# Patient Record
Sex: Female | Born: 1970 | Race: White | Hispanic: No | Marital: Single | Smoking: Never smoker
Health system: Southern US, Community
[De-identification: ages and names within clinical notes are randomized; demographics above are authoritative.]

## PROBLEM LIST (undated history)

## (undated) ENCOUNTER — Emergency Department (HOSPITAL_BASED_OUTPATIENT_CLINIC_OR_DEPARTMENT_OTHER): Admission: EM | Payer: Medicare Other | Source: Home / Self Care

## (undated) DIAGNOSIS — E669 Obesity, unspecified: Secondary | ICD-10-CM

## (undated) DIAGNOSIS — E079 Disorder of thyroid, unspecified: Secondary | ICD-10-CM

## (undated) DIAGNOSIS — K449 Diaphragmatic hernia without obstruction or gangrene: Secondary | ICD-10-CM

## (undated) DIAGNOSIS — H9190 Unspecified hearing loss, unspecified ear: Secondary | ICD-10-CM

## (undated) DIAGNOSIS — K219 Gastro-esophageal reflux disease without esophagitis: Secondary | ICD-10-CM

## (undated) DIAGNOSIS — D649 Anemia, unspecified: Secondary | ICD-10-CM

## (undated) DIAGNOSIS — K209 Esophagitis, unspecified without bleeding: Secondary | ICD-10-CM

## (undated) DIAGNOSIS — R569 Unspecified convulsions: Secondary | ICD-10-CM

## (undated) DIAGNOSIS — F32A Depression, unspecified: Secondary | ICD-10-CM

## (undated) DIAGNOSIS — C801 Malignant (primary) neoplasm, unspecified: Secondary | ICD-10-CM

## (undated) DIAGNOSIS — F445 Conversion disorder with seizures or convulsions: Secondary | ICD-10-CM

## (undated) DIAGNOSIS — T8859XA Other complications of anesthesia, initial encounter: Secondary | ICD-10-CM

## (undated) DIAGNOSIS — F319 Bipolar disorder, unspecified: Secondary | ICD-10-CM

## (undated) DIAGNOSIS — K439 Ventral hernia without obstruction or gangrene: Secondary | ICD-10-CM

## (undated) DIAGNOSIS — F419 Anxiety disorder, unspecified: Secondary | ICD-10-CM

## (undated) DIAGNOSIS — Z9889 Other specified postprocedural states: Secondary | ICD-10-CM

## (undated) DIAGNOSIS — T4145XA Adverse effect of unspecified anesthetic, initial encounter: Secondary | ICD-10-CM

## (undated) DIAGNOSIS — F329 Major depressive disorder, single episode, unspecified: Secondary | ICD-10-CM

## (undated) DIAGNOSIS — F819 Developmental disorder of scholastic skills, unspecified: Secondary | ICD-10-CM

## (undated) DIAGNOSIS — G4730 Sleep apnea, unspecified: Secondary | ICD-10-CM

## (undated) DIAGNOSIS — R112 Nausea with vomiting, unspecified: Secondary | ICD-10-CM

## (undated) DIAGNOSIS — E039 Hypothyroidism, unspecified: Secondary | ICD-10-CM

## (undated) HISTORY — DX: Diaphragmatic hernia without obstruction or gangrene: K44.9

## (undated) HISTORY — DX: Unspecified convulsions: R56.9

## (undated) HISTORY — DX: Esophagitis, unspecified: K20.9

## (undated) HISTORY — DX: Sleep apnea, unspecified: G47.30

## (undated) HISTORY — DX: Anxiety disorder, unspecified: F41.9

## (undated) HISTORY — DX: Esophagitis, unspecified without bleeding: K20.90

## (undated) HISTORY — PX: TONSILLECTOMY: SUR1361

## (undated) HISTORY — DX: Depression, unspecified: F32.A

## (undated) HISTORY — DX: Disorder of thyroid, unspecified: E07.9

## (undated) HISTORY — DX: Major depressive disorder, single episode, unspecified: F32.9

## (undated) HISTORY — PX: DILATION AND CURETTAGE OF UTERUS: SHX78

## (undated) HISTORY — DX: Conversion disorder with seizures or convulsions: F44.5

## (undated) HISTORY — DX: Obesity, unspecified: E66.9

---

## 1992-04-01 HISTORY — PX: INNER EAR SURGERY: SHX679

## 1998-11-28 ENCOUNTER — Other Ambulatory Visit: Admission: RE | Admit: 1998-11-28 | Discharge: 1998-11-28 | Payer: Self-pay | Admitting: Family Medicine

## 2001-01-26 ENCOUNTER — Emergency Department (HOSPITAL_COMMUNITY): Admission: EM | Admit: 2001-01-26 | Discharge: 2001-01-26 | Payer: Self-pay

## 2001-01-27 ENCOUNTER — Inpatient Hospital Stay (HOSPITAL_COMMUNITY): Admission: EM | Admit: 2001-01-27 | Discharge: 2001-01-29 | Payer: Self-pay | Admitting: Emergency Medicine

## 2001-02-18 ENCOUNTER — Ambulatory Visit (HOSPITAL_COMMUNITY): Admission: RE | Admit: 2001-02-18 | Discharge: 2001-02-18 | Payer: Self-pay | Admitting: Otolaryngology

## 2001-02-18 ENCOUNTER — Encounter: Payer: Self-pay | Admitting: Otolaryngology

## 2004-07-02 ENCOUNTER — Ambulatory Visit (HOSPITAL_COMMUNITY): Admission: RE | Admit: 2004-07-02 | Discharge: 2004-07-02 | Payer: Self-pay | Admitting: Pediatrics

## 2005-02-01 ENCOUNTER — Ambulatory Visit: Payer: Self-pay | Admitting: Family Medicine

## 2005-02-18 ENCOUNTER — Encounter: Admission: RE | Admit: 2005-02-18 | Discharge: 2005-05-19 | Payer: Self-pay | Admitting: Psychiatry

## 2005-02-25 ENCOUNTER — Encounter: Admission: RE | Admit: 2005-02-25 | Discharge: 2005-02-25 | Payer: Self-pay | Admitting: Otolaryngology

## 2005-08-08 ENCOUNTER — Emergency Department (HOSPITAL_COMMUNITY): Admission: EM | Admit: 2005-08-08 | Discharge: 2005-08-08 | Payer: Self-pay | Admitting: Emergency Medicine

## 2005-10-28 ENCOUNTER — Ambulatory Visit: Payer: Self-pay | Admitting: Family Medicine

## 2005-10-31 ENCOUNTER — Ambulatory Visit: Payer: Self-pay | Admitting: Family Medicine

## 2005-11-21 ENCOUNTER — Ambulatory Visit: Payer: Self-pay | Admitting: Family Medicine

## 2006-08-05 ENCOUNTER — Encounter: Admission: RE | Admit: 2006-08-05 | Discharge: 2006-08-05 | Payer: Self-pay | Admitting: Otolaryngology

## 2010-04-03 ENCOUNTER — Emergency Department: Payer: Self-pay | Admitting: Emergency Medicine

## 2010-04-08 ENCOUNTER — Emergency Department: Payer: Self-pay | Admitting: Emergency Medicine

## 2010-04-22 ENCOUNTER — Encounter: Payer: Self-pay | Admitting: Otolaryngology

## 2010-04-25 ENCOUNTER — Emergency Department: Payer: Self-pay | Admitting: Emergency Medicine

## 2011-04-03 DIAGNOSIS — F259 Schizoaffective disorder, unspecified: Secondary | ICD-10-CM | POA: Diagnosis not present

## 2011-04-08 DIAGNOSIS — Z79899 Other long term (current) drug therapy: Secondary | ICD-10-CM | POA: Diagnosis not present

## 2011-04-08 DIAGNOSIS — G40309 Generalized idiopathic epilepsy and epileptic syndromes, not intractable, without status epilepticus: Secondary | ICD-10-CM | POA: Diagnosis not present

## 2011-04-17 DIAGNOSIS — F259 Schizoaffective disorder, unspecified: Secondary | ICD-10-CM | POA: Diagnosis not present

## 2011-05-08 DIAGNOSIS — F259 Schizoaffective disorder, unspecified: Secondary | ICD-10-CM | POA: Diagnosis not present

## 2011-05-22 DIAGNOSIS — F259 Schizoaffective disorder, unspecified: Secondary | ICD-10-CM | POA: Diagnosis not present

## 2011-05-30 ENCOUNTER — Ambulatory Visit (INDEPENDENT_AMBULATORY_CARE_PROVIDER_SITE_OTHER): Payer: Medicare Other | Admitting: Family Medicine

## 2011-05-30 ENCOUNTER — Encounter: Payer: Self-pay | Admitting: Family Medicine

## 2011-05-30 DIAGNOSIS — E039 Hypothyroidism, unspecified: Secondary | ICD-10-CM | POA: Diagnosis not present

## 2011-05-30 DIAGNOSIS — F329 Major depressive disorder, single episode, unspecified: Secondary | ICD-10-CM | POA: Insufficient documentation

## 2011-05-30 DIAGNOSIS — Z136 Encounter for screening for cardiovascular disorders: Secondary | ICD-10-CM

## 2011-05-30 DIAGNOSIS — E079 Disorder of thyroid, unspecified: Secondary | ICD-10-CM | POA: Insufficient documentation

## 2011-05-30 DIAGNOSIS — F333 Major depressive disorder, recurrent, severe with psychotic symptoms: Secondary | ICD-10-CM | POA: Diagnosis not present

## 2011-05-30 DIAGNOSIS — N920 Excessive and frequent menstruation with regular cycle: Secondary | ICD-10-CM | POA: Insufficient documentation

## 2011-05-30 DIAGNOSIS — F319 Bipolar disorder, unspecified: Secondary | ICD-10-CM | POA: Insufficient documentation

## 2011-05-30 DIAGNOSIS — R569 Unspecified convulsions: Secondary | ICD-10-CM | POA: Insufficient documentation

## 2011-05-30 DIAGNOSIS — G4730 Sleep apnea, unspecified: Secondary | ICD-10-CM

## 2011-05-30 DIAGNOSIS — F32A Depression, unspecified: Secondary | ICD-10-CM | POA: Insufficient documentation

## 2011-05-30 DIAGNOSIS — F79 Unspecified intellectual disabilities: Secondary | ICD-10-CM

## 2011-05-30 LAB — CBC WITH DIFFERENTIAL/PLATELET
Basophils Relative: 0.6 % (ref 0.0–3.0)
Eosinophils Absolute: 0.2 10*3/uL (ref 0.0–0.7)
Eosinophils Relative: 4.5 % (ref 0.0–5.0)
Hemoglobin: 10.7 g/dL — ABNORMAL LOW (ref 12.0–15.0)
Lymphocytes Relative: 25.9 % (ref 12.0–46.0)
MCHC: 32.5 g/dL (ref 30.0–36.0)
Neutro Abs: 3.5 10*3/uL (ref 1.4–7.7)
RBC: 3.48 Mil/uL — ABNORMAL LOW (ref 3.87–5.11)
WBC: 5.5 10*3/uL (ref 4.5–10.5)

## 2011-05-30 LAB — BASIC METABOLIC PANEL
BUN: 11 mg/dL (ref 6–23)
Chloride: 110 mEq/L (ref 96–112)
Creatinine, Ser: 1 mg/dL (ref 0.4–1.2)
GFR: 68.08 mL/min (ref >60.00)
Glucose, Bld: 108 mg/dL — ABNORMAL HIGH (ref 70–99)

## 2011-05-30 LAB — LIPID PANEL
Cholesterol: 166 mg/dL (ref 0–200)
Total CHOL/HDL Ratio: 4

## 2011-05-30 MED ORDER — NORETHIN ACE-ETH ESTRAD-FE 1.5-30 MG-MCG PO TABS: 1.0000 | ORAL_TABLET | Freq: Every day | ORAL | Status: DC

## 2011-05-30 MED ORDER — LEVOTHYROXINE SODIUM 300 MCG PO TABS: 300.0000 ug | ORAL_TABLET | Freq: Every day | ORAL | Status: DC

## 2011-05-30 NOTE — Patient Instructions (Signed)
Nice to meet you. Let's check some blood work today. Schedule an appointment for a physical and pap smear on your way out.   I sent in prescription for birth control pill.

## 2011-05-30 NOTE — Progress Notes (Signed)
Subjective:    Patient ID: Nancy Hall, female    DOB: 1970-12-24, 41 y.o.   MRN: [id number]  HPI  41 yo female with h/o MR, seizures, depression with psychosis and pseudoseizures, hypothyroidism here to establish care.   Seizure d/o - first grandmal seziure at 25 yo. Followed by Dr. Anne Hahn.  On Topamax 100 mg twice daily, Lamictal 300 mg daily. Last grand mal seizure over a year ago. Has pseudo seizures weekly.  Bipolar disorder- followed by pscyh, on Abilify 15 mg daily.  Hypothyroidism- taking 300 mg! Of synthroid daily.  Mom reports that she has not had her thyroid rechecked in over a year. Pt is a poor historian and unable to tell me if she has had any symptoms of hypo or hyperthyroidism.  Menorrhagia- past 6 months, periods are heavy and more irregular.  According to her mom, using several pads per day for at least 5 days and occuring as often as every 3 weeks. Denies being dizzy when standing from a seated position. Has never had a pap smear.  Patient Active Problem List  Diagnoses  . Seizures  . Hypothyroidism  . Depression, major, recurrent, severe with psychosis  . MR (mental retardation)  . Bipolar disorder  . Sleep apnea  . Menorrhagia   Past Medical History  Diagnosis Date  . Depression   . Thyroid disease   . Seizures     according to echart- seizures vs pseudoseizures   No past surgical history on file. History  Substance Use Topics  . Smoking status: Never Smoker   . Smokeless tobacco: Not on file  . Alcohol Use: Not on file   No family history on file. Allergies  Allergen Reactions  . Penicillins Rash   No current outpatient prescriptions on file prior to visit.   The PMH, PSH, Social History, Family History, Medications, and allergies have been reviewed in [facility], and have been updated if relevant.   Review of Systems See HPI Patient reports no  vision/ hearing changes,anorexia, weight change, fever ,adenopathy, persistant / recurrent  hoarseness, swallowing issues, chest pain, edema,persistant / recurrent cough, hemoptysis, dyspnea(rest, exertional, paroxysmal nocturnal), gastrointestinal  bleeding (melena, rectal bleeding), abdominal pain, excessive heart burn, GU symptoms(dysuria, hematuria, pyuria, voiding/incontinence  Issues) syncope, focal weakness, severe memory loss, concerning skin lesions, depression, anxiety, abnormal bruising/bleeding, major joint swelling, breast masses or abnormal vaginal bleeding.       Objective:   Physical Exam BP 110/70  Pulse 60  Temp(Src) 97.8 F (36.6 C) (Oral)  Ht 5\' 5"  (1.651 m)  Wt 263 lb (119.296 kg)  BMI 43.77 kg/m2  LMP 05/25/2011  General:  Well-developed,well-nourished,in no acute distress; alert,appropriate and cooperative throughout examination Head:  normocephalic and atraumatic.   Eyes:  vision grossly intact, pupils equal, pupils round, and pupils reactive to light.   Lungs:  Normal respiratory effort, chest expands symmetrically. Lungs are clear to auscultation, no crackles or wheezes. Heart:  Normal rate and regular rhythm. S1 and S2 normal without gallop, murmur, click, rub or other extra sounds. Abdomen:  Bowel sounds positive,abdomen soft and non-tender without masses, organomegaly or hernias noted. Msk:  No deformity or scoliosis noted of thoracic or lumbar spine.   Extremities:  No clubbing, cyanosis, edema, or deformity noted with normal full range of motion of all joints.   Psych:  Good eye contact, conversant but shy Assessment & Plan:   1. Hypothyroidism  Taking a very high dose. Will recheck TSH and FT4 today. T4, Free, TSH  2. Depression, major, recurrent, severe with psychosis  Per mom, currently stable.   3. Seizures  Stable, followed by Dr. Anne Hahn   4. Bipolar disorder  Per mom, stable.   5. Menorrhagia  New.  Discussed tx options and pt agreed to try OCPs.  Will start Loestrin and follow up with me for CPX/Pap. Basic Metabolic Panel,  norethindrone-ethinyl estradiol-iron (MICROGESTIN FE,GILDESS FE,LOESTRIN FE) 1.5-30 MG-MCG tablet, CBC with Differential

## 2011-06-11 DIAGNOSIS — F259 Schizoaffective disorder, unspecified: Secondary | ICD-10-CM | POA: Diagnosis not present

## 2011-06-24 ENCOUNTER — Other Ambulatory Visit: Payer: Self-pay | Admitting: Family Medicine

## 2011-06-24 ENCOUNTER — Other Ambulatory Visit (HOSPITAL_COMMUNITY)
Admission: RE | Admit: 2011-06-24 | Discharge: 2011-06-24 | Disposition: A | Discharge status: Home / Self Care | Payer: Medicare Other | Source: Ambulatory Visit | Attending: Family Medicine | Admitting: Family Medicine

## 2011-06-24 ENCOUNTER — Ambulatory Visit (INDEPENDENT_AMBULATORY_CARE_PROVIDER_SITE_OTHER): Payer: Medicare Other | Admitting: Family Medicine

## 2011-06-24 ENCOUNTER — Encounter: Payer: Self-pay | Admitting: Family Medicine

## 2011-06-24 VITALS — BP 110/78 | HR 76 | Temp 97.8°F | Wt 263.0 lb

## 2011-06-24 DIAGNOSIS — Z01419 Encounter for gynecological examination (general) (routine) without abnormal findings: Secondary | ICD-10-CM | POA: Diagnosis not present

## 2011-06-24 DIAGNOSIS — F333 Major depressive disorder, recurrent, severe with psychotic symptoms: Secondary | ICD-10-CM

## 2011-06-24 DIAGNOSIS — N920 Excessive and frequent menstruation with regular cycle: Secondary | ICD-10-CM | POA: Diagnosis not present

## 2011-06-24 DIAGNOSIS — Z124 Encounter for screening for malignant neoplasm of cervix: Secondary | ICD-10-CM | POA: Insufficient documentation

## 2011-06-24 DIAGNOSIS — Z0000 Encounter for general adult medical examination without abnormal findings: Secondary | ICD-10-CM | POA: Diagnosis not present

## 2011-06-24 DIAGNOSIS — Z1159 Encounter for screening for other viral diseases: Secondary | ICD-10-CM | POA: Diagnosis not present

## 2011-06-24 DIAGNOSIS — E039 Hypothyroidism, unspecified: Secondary | ICD-10-CM | POA: Diagnosis not present

## 2011-06-24 DIAGNOSIS — Z139 Encounter for screening, unspecified: Secondary | ICD-10-CM

## 2011-06-24 NOTE — Patient Instructions (Signed)
Good to see you. We will call you or send a letter with results from your pap smear. Please let me know how your vaginal bleeding is doing over next several months.

## 2011-06-24 NOTE — Progress Notes (Signed)
Subjective:    Patient ID: Nancy Hall, female    DOB: 06-04-1970, 41 y.o.   MRN: [id number]  HPI  41 yo female with h/o MR, seizures, depression with psychosis and pseudoseizures, hypothyroidism here for CPX/Annual Medicare Wellness visit.  I have personally reviewed the Medicare Annual Wellness questionnaire and have noted 1. The patient's medical and social history 2. Their use of alcohol, tobacco or illicit drugs 3. Their current medications and supplements 4. The patient's functional ability including ADL's, fall risks, home safety risks and hearing or visual             impairment. 5. Diet and physical activities 6. Evidence for depression or mood disorders   Seizure d/o - first grandmal seziure at 66 yo. Followed by Dr. Anne Hahn.  On Topamax 100 mg twice daily, Lamictal 300 mg daily. Last grand mal seizure over a year ago. Has pseudo seizures weekly.  Bipolar disorder- followed by pscyh, on Abilify 15 mg daily.  Hypothyroidism- taking 300 mg Of synthroid daily.   Pt is a poor historian and unable to tell me if she has had any symptoms of hypo or hyperthyroidism. Lab Results  Component Value Date   TSH 0.04* 05/30/2011  .but FT4 within normal limits- 0.97.  Menorrhagia- past 6 months, periods are heavy and more irregular.  According to her mom, using several pads per day for at least 5 days and occuring as often as every 3 weeks. Denies being dizzy when standing from a seated position. Has never had a pap smear. Started loestrin last month.  Patient Active Problem List  Diagnoses  . Seizures  . Hypothyroidism  . Depression, major, recurrent, severe with psychosis  . MR (mental retardation)  . Bipolar disorder  . Sleep apnea  . Menorrhagia  . Routine general medical examination at a health care facility   Past Medical History  Diagnosis Date  . Depression   . Thyroid disease   . Seizures     according to echart- seizures vs pseudoseizures   No past surgical  history on file. History  Substance Use Topics  . Smoking status: Never Smoker   . Smokeless tobacco: Not on file  . Alcohol Use: Not on file   No family history on file. Allergies  Allergen Reactions  . Penicillins Rash   Current Outpatient Prescriptions on File Prior to Visit  Medication Sig Dispense Refill  . ARIPiprazole (ABILIFY) 15 MG tablet Take 15 mg by mouth daily.      . Fluticasone-Salmeterol (ADVAIR) 100-50 MCG/DOSE AEPB Inhale 1 puff into the lungs every 12 (twelve) hours.      Marland Kitchen lamoTRIgine (LAMICTAL) 150 MG tablet Take 150 mg by mouth 2 (two) times daily.      Marland Kitchen levothyroxine (SYNTHROID) 300 MCG tablet Take 1 tablet (300 mcg total) by mouth daily.      Marland Kitchen LIOTHYRONINE SODIUM PO Take by mouth.      . norethindrone-ethinyl estradiol-iron (MICROGESTIN FE,GILDESS FE,LOESTRIN FE) 1.5-30 MG-MCG tablet Take 1 tablet by mouth daily.  1 Package  11  . sertraline (ZOLOFT) 100 MG tablet Take one by mouth daily      . topiramate (TOPAMAX) 100 MG tablet Take 100 mg by mouth 2 (two) times daily.       The PMH, PSH, Social History, Family History, Medications, and allergies have been reviewed in [facility], and have been updated if relevant.   Review of Systems See HPI Patient reports no  vision/ hearing changes,anorexia, weight change, fever ,  adenopathy, persistant / recurrent hoarseness, swallowing issues, chest pain, edema,persistant / recurrent cough, hemoptysis, dyspnea(rest, exertional, paroxysmal nocturnal), gastrointestinal  bleeding (melena, rectal bleeding), abdominal pain, excessive heart burn, GU symptoms(dysuria, hematuria, pyuria, voiding/incontinence  Issues) syncope, focal weakness, severe memory loss, concerning skin lesions, depression, anxiety, abnormal bruising/bleeding, major joint swelling, breast masses or abnormal vaginal bleeding.       Objective:   Physical Exam LMP 05/25/2011 BP 110/78  Pulse 76  Temp(Src) 97.8 F (36.6 C) (Oral)  Wt 263 lb (119.296 kg)  LMP  06/21/2011  General:  Well-developed,well-nourished,in no acute distress; alert,appropriate and cooperative throughout examination Head:  normocephalic and atraumatic.   Eyes:  vision grossly intact, pupils equal, pupils round, and pupils reactive to light.   Lungs:  Normal respiratory effort, chest expands symmetrically. Lungs are clear to auscultation, no crackles or wheezes. Heart:  Normal rate and regular rhythm. S1 and S2 normal without gallop, murmur, click, rub or other extra sounds. Abdomen:  Bowel sounds positive,abdomen soft and non-tender without masses, organomegaly or hernias noted. Msk:  No deformity or scoliosis noted of thoracic or lumbar spine.   Extremities:  No clubbing, cyanosis, edema, or deformity noted with normal full range of motion of all joints.   Psych:  Good eye contact, conversant but shy Assessment & Plan:   1. Depression, major, recurrent, severe with psychosis  Stable, followed by psychiatry.   2. Routine general medical examination at a health care facility  The patients weight, height, BMI and visual acuity have been recorded in the chart I have made referrals, counseling and provided education to the patient based review of the above and I have provided the pt with a written personalized care plan for preventive services.  Cytology - PAP  3. Hypothyroidism  Stable on current meds.   4. Menorrhagia   Started OCPs a few weeks ago.

## 2011-06-26 DIAGNOSIS — F259 Schizoaffective disorder, unspecified: Secondary | ICD-10-CM | POA: Diagnosis not present

## 2011-07-02 ENCOUNTER — Encounter: Payer: Self-pay | Admitting: *Deleted

## 2011-07-02 LAB — HM PAP SMEAR: HM Pap smear: NORMAL

## 2011-07-10 DIAGNOSIS — F259 Schizoaffective disorder, unspecified: Secondary | ICD-10-CM | POA: Diagnosis not present

## 2011-07-30 DIAGNOSIS — F259 Schizoaffective disorder, unspecified: Secondary | ICD-10-CM | POA: Diagnosis not present

## 2011-08-14 DIAGNOSIS — F259 Schizoaffective disorder, unspecified: Secondary | ICD-10-CM | POA: Diagnosis not present

## 2011-08-19 ENCOUNTER — Other Ambulatory Visit: Payer: Self-pay | Admitting: Family Medicine

## 2011-08-28 DIAGNOSIS — F259 Schizoaffective disorder, unspecified: Secondary | ICD-10-CM | POA: Diagnosis not present

## 2011-09-18 DIAGNOSIS — F259 Schizoaffective disorder, unspecified: Secondary | ICD-10-CM | POA: Diagnosis not present

## 2011-10-10 DIAGNOSIS — G40309 Generalized idiopathic epilepsy and epileptic syndromes, not intractable, without status epilepticus: Secondary | ICD-10-CM | POA: Diagnosis not present

## 2011-10-16 DIAGNOSIS — G40309 Generalized idiopathic epilepsy and epileptic syndromes, not intractable, without status epilepticus: Secondary | ICD-10-CM | POA: Diagnosis not present

## 2011-10-22 DIAGNOSIS — F259 Schizoaffective disorder, unspecified: Secondary | ICD-10-CM | POA: Diagnosis not present

## 2011-11-06 DIAGNOSIS — F259 Schizoaffective disorder, unspecified: Secondary | ICD-10-CM | POA: Diagnosis not present

## 2011-11-15 ENCOUNTER — Encounter: Payer: Self-pay | Admitting: Family Medicine

## 2011-11-15 ENCOUNTER — Ambulatory Visit (INDEPENDENT_AMBULATORY_CARE_PROVIDER_SITE_OTHER): Payer: Medicare Other | Admitting: Family Medicine

## 2011-11-15 VITALS — BP 108/80 | HR 62 | Temp 97.7°F | Resp 18 | Wt 256.0 lb

## 2011-11-15 DIAGNOSIS — R109 Unspecified abdominal pain: Secondary | ICD-10-CM

## 2011-11-15 NOTE — Progress Notes (Signed)
Subjective:    Patient ID: Nancy Hall, female    DOB: 12-28-1970, 41 y.o.   MRN: [id number]  HPI  41 yo female with h/o MR, seizures, depression with psychosis and pseudoseizures, hypothyroidism here for several months of abdominal pain.  Pain is almost always postprandial.  She reports it is usually in epigastric area but also can be in RUQ.  Getting progressively worse and worse with greasy food.  No n/v/d. No blood in stool.  No fever.    Patient Active Problem List  Diagnosis  . Seizures  . Hypothyroidism  . Depression, major, recurrent, severe with psychosis  . MR (mental retardation)  . Bipolar disorder  . Sleep apnea  . Menorrhagia  . Routine general medical examination at a health care facility  . Gynecological examination  . Abdominal pain   Past Medical History  Diagnosis Date  . Depression   . Thyroid disease   . Seizures     according to echart- seizures vs pseudoseizures   No past surgical history on file. History  Substance Use Topics  . Smoking status: Never Smoker   . Smokeless tobacco: Not on file  . Alcohol Use: Not on file   No family history on file. Allergies  Allergen Reactions  . Penicillins Rash   Current Outpatient Prescriptions on File Prior to Visit  Medication Sig Dispense Refill  . ARIPiprazole (ABILIFY) 15 MG tablet Take 15 mg by mouth daily.      . Fluticasone-Salmeterol (ADVAIR) 100-50 MCG/DOSE AEPB Inhale 1 puff into the lungs every 12 (twelve) hours.      Marland Kitchen lamoTRIgine (LAMICTAL) 150 MG tablet Take 150 mg by mouth 2 (two) times daily.      Marland Kitchen levothyroxine (SYNTHROID) 300 MCG tablet Take 1 tablet (300 mcg total) by mouth daily.      Marland Kitchen levothyroxine (SYNTHROID, LEVOTHROID) 125 MCG tablet TAKE 2 TABLETS BY MOUTH EVERY DAY  60 tablet  11  . levothyroxine (SYNTHROID, LEVOTHROID) 50 MCG tablet TAKE 1 TABLET BY MOUTH EVERY DAY  30 tablet  11  . liothyronine (CYTOMEL) 25 MCG tablet TAKE 1 TABLET BY MOUTH EVERY DAY  30 tablet   11  . norethindrone-ethinyl estradiol-iron (MICROGESTIN FE,GILDESS FE,LOESTRIN FE) 1.5-30 MG-MCG tablet Take 1 tablet by mouth daily.  1 Package  11  . sertraline (ZOLOFT) 100 MG tablet Take one by mouth daily      . topiramate (TOPAMAX) 100 MG tablet Take one by mouth in the morning and one and a half at night      . DISCONTD: LIOTHYRONINE SODIUM PO Take by mouth.       The PMH, PSH, Social History, Family History, Medications, and allergies have been reviewed in [facility], and have been updated if relevant.   Review of Systems See HPI  Denies any feeling of acid in throat or difficulty swallowing     Objective:   Physical Exam BP 108/80  Pulse 62  Temp 97.7 F (36.5 C)  Resp 18  Wt 256 lb (116.121 kg)  General:  Well-developed,well-nourished,in no acute distress; alert,appropriate and cooperative throughout examination Head:  normocephalic and atraumatic.   Eyes:  vision grossly intact, pupils equal, pupils round, and pupils reactive to light.   Lungs:  Normal respiratory effort, chest expands symmetrically. Lungs are clear to auscultation, no crackles or wheezes. Heart:  Normal rate and regular rhythm. S1 and S2 normal without gallop, murmur, click, rub or other extra sounds. Abdomen:  Bowel sounds positive,abdomen soft, mildly TTP  RUQ Msk:  No deformity or scoliosis noted of thoracic or lumbar spine.   Extremities:  No clubbing, cyanosis, edema, or deformity noted with normal full range of motion of all joints.   Psych:  Good eye contact, conversant but shy (baseline) Assessment & Plan:   1. Abdominal pain  US Abdomen Complete   New- suspicious for biliary colic/stones. Will order abdominal U/S.  If neg, consider GI referral given duration and progression of symptoms. The patient and her mother indicate understanding of these issues and agrees with the plan.'

## 2011-11-15 NOTE — Patient Instructions (Addendum)
Nice to see you. Have a great weekend. Please stop by to see Nancy Hall on your way out to set up your ultrasound. I will call you with your results as soon as I get them.

## 2011-11-19 ENCOUNTER — Encounter: Payer: Self-pay | Admitting: Family Medicine

## 2011-11-19 ENCOUNTER — Ambulatory Visit: Payer: Self-pay | Admitting: Family Medicine

## 2011-11-19 DIAGNOSIS — R109 Unspecified abdominal pain: Secondary | ICD-10-CM | POA: Diagnosis not present

## 2011-11-27 DIAGNOSIS — F259 Schizoaffective disorder, unspecified: Secondary | ICD-10-CM | POA: Diagnosis not present

## 2011-12-23 DIAGNOSIS — H04129 Dry eye syndrome of unspecified lacrimal gland: Secondary | ICD-10-CM | POA: Diagnosis not present

## 2011-12-25 DIAGNOSIS — F259 Schizoaffective disorder, unspecified: Secondary | ICD-10-CM | POA: Diagnosis not present

## 2011-12-26 DIAGNOSIS — H6980 Other specified disorders of Eustachian tube, unspecified ear: Secondary | ICD-10-CM | POA: Diagnosis not present

## 2011-12-26 DIAGNOSIS — H903 Sensorineural hearing loss, bilateral: Secondary | ICD-10-CM | POA: Diagnosis not present

## 2012-01-21 DIAGNOSIS — F259 Schizoaffective disorder, unspecified: Secondary | ICD-10-CM | POA: Diagnosis not present

## 2012-02-05 DIAGNOSIS — F259 Schizoaffective disorder, unspecified: Secondary | ICD-10-CM | POA: Diagnosis not present

## 2012-03-03 DIAGNOSIS — F259 Schizoaffective disorder, unspecified: Secondary | ICD-10-CM | POA: Diagnosis not present

## 2012-03-06 DIAGNOSIS — G40309 Generalized idiopathic epilepsy and epileptic syndromes, not intractable, without status epilepticus: Secondary | ICD-10-CM | POA: Diagnosis not present

## 2012-03-18 DIAGNOSIS — F259 Schizoaffective disorder, unspecified: Secondary | ICD-10-CM | POA: Diagnosis not present

## 2012-04-01 DIAGNOSIS — R569 Unspecified convulsions: Secondary | ICD-10-CM

## 2012-04-01 HISTORY — DX: Unspecified convulsions: R56.9

## 2012-04-08 ENCOUNTER — Ambulatory Visit (INDEPENDENT_AMBULATORY_CARE_PROVIDER_SITE_OTHER): Payer: Medicare Other | Admitting: *Deleted

## 2012-04-08 DIAGNOSIS — F259 Schizoaffective disorder, unspecified: Secondary | ICD-10-CM | POA: Diagnosis not present

## 2012-04-08 DIAGNOSIS — Z23 Encounter for immunization: Secondary | ICD-10-CM | POA: Diagnosis not present

## 2012-05-04 DIAGNOSIS — F259 Schizoaffective disorder, unspecified: Secondary | ICD-10-CM | POA: Diagnosis not present

## 2012-05-06 ENCOUNTER — Encounter: Payer: Self-pay | Admitting: Family Medicine

## 2012-05-06 ENCOUNTER — Ambulatory Visit (INDEPENDENT_AMBULATORY_CARE_PROVIDER_SITE_OTHER): Payer: Medicare Other | Admitting: Family Medicine

## 2012-05-06 VITALS — BP 100/62 | HR 90 | Temp 97.6°F | Wt 256.0 lb

## 2012-05-06 DIAGNOSIS — T887XXA Unspecified adverse effect of drug or medicament, initial encounter: Secondary | ICD-10-CM

## 2012-05-06 DIAGNOSIS — E039 Hypothyroidism, unspecified: Secondary | ICD-10-CM

## 2012-05-06 DIAGNOSIS — Z136 Encounter for screening for cardiovascular disorders: Secondary | ICD-10-CM

## 2012-05-06 DIAGNOSIS — T50905A Adverse effect of unspecified drugs, medicaments and biological substances, initial encounter: Secondary | ICD-10-CM

## 2012-05-06 LAB — LIPID PANEL
Cholesterol: 166 mg/dL (ref 0–200)
Total CHOL/HDL Ratio: 4
Triglycerides: 239 mg/dL — ABNORMAL HIGH (ref 0.0–149.0)
VLDL: 47.8 mg/dL — ABNORMAL HIGH (ref 0.0–40.0)

## 2012-05-06 LAB — T4, FREE: Free T4: 1 ng/dL (ref 0.60–1.60)

## 2012-05-06 LAB — CBC WITH DIFFERENTIAL/PLATELET
Eosinophils Relative: 3.7 % (ref 0.0–5.0)
HCT: 38 % (ref 36.0–46.0)
Lymphs Abs: 1.5 10*3/uL (ref 0.7–4.0)
Monocytes Relative: 8.3 % (ref 3.0–12.0)
Platelets: 162 10*3/uL (ref 150.0–400.0)
RBC: 4.06 Mil/uL (ref 3.87–5.11)
WBC: 5.2 10*3/uL (ref 4.5–10.5)

## 2012-05-06 LAB — COMPREHENSIVE METABOLIC PANEL
ALT: 16 U/L (ref 0–35)
CO2: 24 mEq/L (ref 19–32)
Calcium: 8.3 mg/dL — ABNORMAL LOW (ref 8.4–10.5)
Chloride: 110 mEq/L (ref 96–112)
Creatinine, Ser: 0.9 mg/dL (ref 0.4–1.2)
GFR: 72.01 mL/min (ref >60.00)
Glucose, Bld: 86 mg/dL (ref 70–99)
Total Bilirubin: 0.4 mg/dL (ref 0.3–1.2)
Total Protein: 6.6 g/dL (ref 6.0–8.3)

## 2012-05-06 NOTE — Patient Instructions (Addendum)
Good to see you. We will call you and we will fax results to [employer].

## 2012-05-06 NOTE — Progress Notes (Signed)
Subjective:    Patient ID: Nancy Hall, female    DOB: 1970/07/31, 42 y.o.   MRN: [id number]  HPI  42 yo female with h/o MR, seizures, depression with psychosis and pseudoseizures, hypothyroidism here for :  Fatigue- h/o of hypothyroidism on high dose synthroid replacement.  She is on abilify and followed by Nancy Hall.  Her depression has deteriorated and Nancy Hall wanted Nancy Hall to come in today to make sure her thyroid isn't under or over corrected to account for her worsening symptoms.  Does have occasional "hot flashes," otherwise denies any symptoms of hypo or hyperthyroidism.   Seizure d/o - first grandmal seziure at 79 yo. Followed by Nancy Hall.  On Topamax 100 mg twice daily, Lamictal 300 mg daily. Last grand mal seizure over a year ago. Has pseudo seizures weekly.  Bipolar disorder- followed by pscyh, on Abilify 15 mg daily.    Patient Active Problem List  Diagnosis  . Seizures  . Hypothyroidism  . Depression, major, recurrent, severe with psychosis  . MR (mental retardation)  . Bipolar disorder  . Sleep apnea  . Menorrhagia  . Gynecological examination  . Abdominal pain   Past Medical History  Diagnosis Date  . Depression   . Thyroid disease   . Seizures     according to echart- seizures vs pseudoseizures   No past surgical history on file. History  Substance Use Topics  . Smoking status: Never Smoker   . Smokeless tobacco: Not on file  . Alcohol Use: Not on file   No family history on file. Allergies  Allergen Reactions  . Penicillins Rash   Current Outpatient Prescriptions on File Prior to Visit  Medication Sig Dispense Refill  . ARIPiprazole (ABILIFY) 15 MG tablet Take 15 mg by mouth daily.      . Fluticasone-Salmeterol (ADVAIR) 100-50 MCG/DOSE AEPB Inhale 1 puff into the lungs every 12 (twelve) hours.      Marland Kitchen lamoTRIgine (LAMICTAL) 150 MG tablet Take 150 mg by mouth 2 (two) times daily.      Marland Kitchen levothyroxine (SYNTHROID) 300 MCG tablet Take 1  tablet (300 mcg total) by mouth daily.      Marland Kitchen levothyroxine (SYNTHROID, LEVOTHROID) 125 MCG tablet TAKE 2 TABLETS BY MOUTH EVERY DAY  60 tablet  11  . levothyroxine (SYNTHROID, LEVOTHROID) 50 MCG tablet TAKE 1 TABLET BY MOUTH EVERY DAY  30 tablet  11  . liothyronine (CYTOMEL) 25 MCG tablet TAKE 1 TABLET BY MOUTH EVERY DAY  30 tablet  11  . norethindrone-ethinyl estradiol-iron (MICROGESTIN FE,GILDESS FE,LOESTRIN FE) 1.5-30 MG-MCG tablet Take 1 tablet by mouth daily.  1 Package  11  . sertraline (ZOLOFT) 100 MG tablet Take one by mouth daily      . topiramate (TOPAMAX) 100 MG tablet Take one by mouth in the morning and one and a half at night       The PMH, PSH, Social History, Family History, Medications, and allergies have been reviewed in [facility], and have been updated if relevant.   Review of Systems See HPI      Objective:   Physical Exam BP 100/62  Pulse 90  Temp 97.6 F (36.4 C)  Wt 256 lb (116.121 kg)  General:  Well-developed,well-nourished,in no acute distress; alert,appropriate and cooperative throughout examination Head:  normocephalic and atraumatic.   Eyes:  vision grossly intact, pupils equal, pupils round, and pupils reactive to light.   Lungs:  Normal respiratory effort, chest expands symmetrically. Lungs are clear to auscultation, no  crackles or wheezes. Heart:  Normal rate and regular rhythm. S1 and S2 normal without gallop, murmur, click, rub or other extra sounds. Abdomen:  Bowel sounds positive,abdomen soft and non-tender without masses, organomegaly or hernias noted. Msk:  No deformity or scoliosis noted of thoracic or lumbar spine.   Extremities:  No clubbing, cyanosis, edema, or deformity noted with normal full range of motion of all joints.   Psych:  Good eye contact, conversant but shy Assessment & Plan:   1. Hypothyroidism  Continue current dose of synthroid. Recheck labs today. TSH, T4, Free  2. Medication adverse effect  Check labs since she is on  antipsychotic. The patient indicates understanding of these issues and agrees with the plan.  Comprehensive metabolic panel, CBC with Differential  3. Screening for ischemic heart disease  Lipid panel

## 2012-05-20 DIAGNOSIS — F259 Schizoaffective disorder, unspecified: Secondary | ICD-10-CM | POA: Diagnosis not present

## 2012-05-26 ENCOUNTER — Other Ambulatory Visit: Payer: Self-pay | Admitting: Family Medicine

## 2012-05-31 ENCOUNTER — Other Ambulatory Visit: Payer: Self-pay | Admitting: Family Medicine

## 2012-06-29 ENCOUNTER — Other Ambulatory Visit: Payer: Self-pay | Admitting: Family Medicine

## 2012-07-01 DIAGNOSIS — F259 Schizoaffective disorder, unspecified: Secondary | ICD-10-CM | POA: Diagnosis not present

## 2012-07-23 DIAGNOSIS — H903 Sensorineural hearing loss, bilateral: Secondary | ICD-10-CM | POA: Diagnosis not present

## 2012-07-23 DIAGNOSIS — H60399 Other infective otitis externa, unspecified ear: Secondary | ICD-10-CM | POA: Diagnosis not present

## 2012-07-23 DIAGNOSIS — H6980 Other specified disorders of Eustachian tube, unspecified ear: Secondary | ICD-10-CM | POA: Diagnosis not present

## 2012-07-29 DIAGNOSIS — F259 Schizoaffective disorder, unspecified: Secondary | ICD-10-CM | POA: Diagnosis not present

## 2012-08-05 ENCOUNTER — Other Ambulatory Visit: Payer: Self-pay | Admitting: Family Medicine

## 2012-08-11 ENCOUNTER — Other Ambulatory Visit: Payer: Self-pay | Admitting: Family Medicine

## 2012-09-01 DIAGNOSIS — H60399 Other infective otitis externa, unspecified ear: Secondary | ICD-10-CM | POA: Diagnosis not present

## 2012-09-01 DIAGNOSIS — H903 Sensorineural hearing loss, bilateral: Secondary | ICD-10-CM | POA: Diagnosis not present

## 2012-09-01 DIAGNOSIS — H6980 Other specified disorders of Eustachian tube, unspecified ear: Secondary | ICD-10-CM | POA: Diagnosis not present

## 2012-09-03 ENCOUNTER — Encounter: Payer: Self-pay | Admitting: Nurse Practitioner

## 2012-09-03 ENCOUNTER — Ambulatory Visit (INDEPENDENT_AMBULATORY_CARE_PROVIDER_SITE_OTHER): Payer: Medicare Other | Admitting: Nurse Practitioner

## 2012-09-03 VITALS — BP 109/69 | HR 82 | Ht 66.0 in | Wt 272.0 lb

## 2012-09-03 DIAGNOSIS — G40309 Generalized idiopathic epilepsy and epileptic syndromes, not intractable, without status epilepticus: Secondary | ICD-10-CM

## 2012-09-03 MED ORDER — TOPIRAMATE 100 MG PO TABS: ORAL_TABLET | ORAL | Status: DC

## 2012-09-03 NOTE — Progress Notes (Signed)
I have read the note, and I agree with the clinical assessment and plan.  

## 2012-09-03 NOTE — Patient Instructions (Addendum)
Continue Topamax at current dose.  Will refill Seizure control is better F/U in 6 months

## 2012-09-03 NOTE — Progress Notes (Signed)
HPI: Patient returns for followup after last visit 03/06/2012. She has a history of seizures and pseudoseizures and significant psychiatric disease. She also has a history of obesity. She is currently on Topamax for her seizure events. She is on Lamictal for her pseudoseizures. She sees a Veterinary surgeon once a month and her psychiatrist every 3 months. Mother reports today that she has been doing much better since last seen. She has had 2 emotional seizures in the last 6 months. She needs refills on her medications. Routine and sleep deprived EEG have been normal in the past. Her pseudoseizures were diagnosed at the epilepsy unit at [facility].  ROS:  Depression, anxiety, decreased energy, shortness of breath    Physical Exam General: well developed, obese female , seated, in no evident distress Head: head normocephalic and atraumatic. Oropharynx benign Neck: supple with no carotid  bruits Cardiovascular: regular rate and rhythm, no murmurs  Neurologic Exam Mental Status: Awake and fully alert. Oriented to place and time. Follows all commands. Speech and language normal.   Cranial Nerves:  Pupils equal, briskly reactive to light. Extraocular movements full without nystagmus. Visual fields full to confrontation. Hearing intact and symmetric to finger snap. Facial sensation intact. Face, tongue, palate move normally and symmetrically. Neck flexion and extension normal.  Motor: Normal bulk and tone. Normal strength in all tested extremity muscles.No focal weakness Sensory.: intact to touch and pinprick and vibratory.  Coordination: Rapid alternating movements normal in all extremities. Finger-to-nose and heel-to-shin performed accurately bilaterally. Gait and Station: Arises from chair without difficulty. Stance is normal. . Able to heel, toe and tandem walk without difficulty.  Reflexes: 2+ and symmetric. Toes downgoing.     ASSESSMENT: History of seizures History of pseudoseizures. Her  pseudoseizures were diagnosed at the epilepsy unit at [facility]. Anxiety and depression followed by Dr. Loralie Champagne office      PLAN: The patient will continue her topiramate 100 mg in the morning and 150 at bedtime. RX renewed. Sleep deprived EEG study was normal. The patient does not operate a motor vehicle.  Nilda Riggs, GNP-BC APRN

## 2012-09-08 ENCOUNTER — Encounter: Payer: Self-pay | Admitting: Family Medicine

## 2012-09-08 ENCOUNTER — Ambulatory Visit (INDEPENDENT_AMBULATORY_CARE_PROVIDER_SITE_OTHER): Payer: Medicare Other | Admitting: Family Medicine

## 2012-09-08 VITALS — BP 100/70 | HR 76 | Temp 97.8°F | Wt 272.0 lb

## 2012-09-08 DIAGNOSIS — N760 Acute vaginitis: Secondary | ICD-10-CM

## 2012-09-08 DIAGNOSIS — R21 Rash and other nonspecific skin eruption: Secondary | ICD-10-CM

## 2012-09-08 MED ORDER — TERCONAZOLE 0.8 % VA CREA: 1.0000 | TOPICAL_CREAM | Freq: Every day | VAGINAL | Status: DC

## 2012-09-08 MED ORDER — METRONIDAZOLE 0.75 % EX GEL: Freq: Two times a day (BID) | CUTANEOUS | Status: DC

## 2012-09-08 NOTE — Patient Instructions (Addendum)
Good to see you. You do have a yeast infection- please use Terazol as directed.  Let's also try Metrogel twice daily for two week on your face. Call me in 2 weeks with an update.  Rosacea Rosacea is a long-term (chronic) condition that affects the skin of the face (cheeks, nose, brow, and chin) and sometimes the eyes. Rosacea causes the blood vessels near the surface of the skin to enlarge, resulting in redness. This condition usually begins after age 40. It occurs most often in light-skinned women. Without treatment, rosacea tends to get worse over time. There is no cure for rosacea, but treatment can help control your symptoms. CAUSES  The cause is unknown. It is thought that some people may inherit a tendency to develop rosacea. Certain triggers can make your rosacea worse, including:  Hot baths.  Exercise.  Sunlight.  Very hot or cold temperatures.  Hot or spicy foods and drinks.  Drinking alcohol.  Stress.  Taking blood pressure medicine.  Long-term use of topical steroids on the face. SYMPTOMS   Redness of the face.  Red bumps or pimples on the face.  Red, enlarged nose (rhinophyma).  Blushing easily.  Red lines on the skin.  Irritated or burning feeling in the eyes.  Swollen eyelids. DIAGNOSIS  Your caregiver can usually tell what is wrong by asking about your symptoms and performing a physical exam. TREATMENT  Avoiding triggers is an important part of treatment. You will also need to see a skin specialist (dermatologist) who can develop a treatment plan for you. The goals of treatment are to control your condition and to improve the appearance of your skin. It may take several weeks or months of treatment before you notice an improvement in your skin. Even after your skin improves, you will likely need to continue treatment to prevent your rosacea from coming back. Treatment methods may include:  Using sunscreen or sunblock daily to protect the  skin.  Antibiotic medicine, such as metronidazole, applied directly to the skin.  Antibiotics taken by mouth. This is usually prescribed if you have eye problems from your rosacea.  Laser surgery to improve the appearance of the skin. This surgery can reduce the appearance of red lines on the skin and can remove excess tissue from the nose to reduce its size. HOME CARE INSTRUCTIONS  Avoid things that seem to trigger your flare-ups.  If you are given antibiotics, take them as directed. Finish them even if you start to feel better.  Use a gentle facial cleanser that does not contain alcohol.  You may use a mild facial moisturizer.  Use a sunscreen or sunblock with SPF 30 or greater.  Wear a green-tinted foundation powder to conceal redness, if needed. Choose cosmetics that are noncomedogenic. This means they do not block your pores.  If your eyelids are affected, apply warm compresses to the eyelids. Do this up to 4 times a day or as directed by your caregiver. SEEK MEDICAL CARE IF:  Your skin problems get worse.  You feel depressed.  You lose your appetite.  You have trouble concentrating.  You have problems with your eyes, such as redness or itching. MAKE SURE YOU:  Understand these instructions.  Will watch your condition.  Will get help right away if you are not doing well or get worse. Document Released: 04/25/2004 Document Revised: 09/17/2011 Document Reviewed: 02/26/2011 [facility] Patient Information 2014 [place], [place].

## 2012-09-08 NOTE — Progress Notes (Signed)
Subjective:    Patient ID: Nancy Hall, female    DOB: 03-13-1971, 42 y.o.   MRN: [id number]  HPI  42 yo female with h/o MR, seizures, depression with psychosis and pseudoseizures, hypothyroidism here with her mom for :  1.  "sore bottom"- Past two weeks, has complained of vulvular irritation and itching.  Some thick discharge.  No recent abx or changes in soaps.  Not sexually active. No dysuria.  No fever.  2.  Rash on face- cheeks red with papules for past several months.  Does not bother her.  No changes in her psychiatric medications.    Patient Active Problem List   Diagnosis Date Noted  . Vaginitis and vulvovaginitis 09/08/2012  . Facial rash 09/08/2012  . 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 Abdominal pain 11/15/2011  . Gynecological examination 06/24/2011  . Hypothyroidism 05/30/2011  . Depression, major, recurrent, severe with psychosis 05/30/2011  . MR (mental retardation) 05/30/2011  . Bipolar disorder 05/30/2011  . Sleep apnea 05/30/2011  . Menorrhagia 05/30/2011  . Seizures    Past Medical History  Diagnosis Date  . Depression   . Thyroid disease   . Seizures     according to echart- seizures vs pseudoseizures   No past surgical history on file. History  Substance Use Topics  . Smoking status: Never Smoker   . Smokeless tobacco: Never Used  . Alcohol Use: No   No family history on file. Allergies  Allergen Reactions  . Penicillins Rash   Current Outpatient Prescriptions on File Prior to Visit  Medication Sig Dispense Refill  . ARIPiprazole (ABILIFY) 15 MG tablet Take 15 mg by mouth daily.      . Fluticasone-Salmeterol (ADVAIR) 100-50 MCG/DOSE AEPB Inhale 1 puff into the lungs every 12 (twelve) hours.      Colleen Can FE 1.5/30 1.5-30 MG-MCG tablet TAKE 1 TABLET BY MOUTH DAILY.  28 tablet  5  . lamoTRIgine (LAMICTAL) 150 MG tablet Take 150 mg by mouth 2 (two) times daily.      Marland Kitchen levothyroxine  (SYNTHROID) 300 MCG tablet Take 1 tablet (300 mcg total) by mouth daily.      Marland Kitchen levothyroxine (SYNTHROID, LEVOTHROID) 125 MCG tablet TAKE 2 TABLETS BY MOUTH EVERY DAY  60 tablet  5  . levothyroxine (SYNTHROID, LEVOTHROID) 50 MCG tablet TAKE 1 TABLET BY MOUTH EVERY DAY  30 tablet  5  . liothyronine (CYTOMEL) 25 MCG tablet TAKE 1 TABLET BY MOUTH EVERY DAY  30 tablet  11  . sertraline (ZOLOFT) 100 MG tablet Take one by mouth daily      . topiramate (TOPAMAX) 100 MG tablet Take one by mouth in the morning and one and a half at night  225 tablet  1   No current facility-administered medications on file prior to visit.   The PMH, PSH, Social History, Family History, Medications, and allergies have been reviewed in [facility], and have been updated if relevant.   Review of Systems See HPI      Objective:   Physical Exam BP 100/70  Pulse 76  Temp(Src) 97.8 F (36.6 C)  Wt 272 lb (123.378 kg)  BMI 43.92 kg/m2  General:  Well-developed,well-nourished,in no acute distress; alert,appropriate and cooperative throughout examination Head:  normocephalic and atraumatic.   Eyes:  vision grossly intact, pupils equal, pupils round, and pupils reactive to light.   Lungs:  Normal respiratory effort, chest expands symmetrically. Lungs are clear to auscultation, no crackles or wheezes.  Heart:  Normal rate and regular rhythm. S1 and S2 normal without gallop, murmur, click, rub or other extra sounds. Abdomen:  Bowel sounds positive,abdomen soft and non-tender without masses, organomegaly or hernias noted. Msk:  No deformity or scoliosis noted of thoracic or lumbar spine.   Extremities:  No clubbing, cyanosis, edema, or deformity noted with normal full range of motion of all joints.   Psych:  Good eye contact, conversant but shy GU:  Mild vulvuar irritation with some thick discharge in vault Skin:  Erythematous area with small raised papules on cheeks bilaterally Assessment & Plan:   1. Vaginitis and  vulvovaginitis Wet prep consistent with yeast.  Will treat with terazol.  2. Facial rash ? Medication reaction vs rosacea. Will try a trial of metrogel. They will call in 2 weeks with an update.

## 2012-09-15 ENCOUNTER — Ambulatory Visit (INDEPENDENT_AMBULATORY_CARE_PROVIDER_SITE_OTHER): Payer: Medicare Other | Admitting: Family Medicine

## 2012-09-15 ENCOUNTER — Encounter: Payer: Self-pay | Admitting: Gastroenterology

## 2012-09-15 ENCOUNTER — Encounter: Payer: Self-pay | Admitting: Family Medicine

## 2012-09-15 VITALS — BP 112/82 | HR 60 | Temp 97.9°F | Wt 267.0 lb

## 2012-09-15 DIAGNOSIS — R1310 Dysphagia, unspecified: Secondary | ICD-10-CM

## 2012-09-15 NOTE — Patient Instructions (Addendum)
Please stop by to see Shirlee Limerick on your way out to set up your referral.  Esophageal Stricture The esophagus is the long, narrow tube which carries food and liquid from the mouth to the stomach. Sometimes a part of the esophagus becomes narrow and makes it difficult, painful, or even impossible to swallow. This is called an esophageal stricture.  CAUSES  Common causes of blockage or strictures of the esophagus are:  Exposure of the lower esophagus to the acid from the stomach may cause narrowing.  Hiatal hernia in which a small part of the stomach bulges up through the diaphragm can cause a narrowing in the bottom of the esophagus.  Scleroderma is a tissue disorder that affects the esophagus and makes swallowing difficult.  Achalasia is an absence of nerves in the lower esophagus and to the esophageal sphincter. This absence of nerves may be congenital (present since birth). This can cause irregular spasms which do not allow food and fluid through.  Strictures may develop from swallowing materials which damage the esophagus. Examples are acids or alkalis such as lye.  Schatzki's Ring is a narrow ring of non-cancerous tissue which narrows the lower esophagus. The cause of this is unknown.  Growths can block the esophagus. SYMPTOMS  Some of the problems are difficulty swallowing or pain with swallowing. DIAGNOSIS  Your caregiver often suspects this problem by taking a medical history. They will also do a physical exam. They may then take X-rays and/or perform an endoscopy. Endoscopy is an exam in which a tube like a small flexible telescope is used to look at your esophagus.  TREATMENT  One form of treatment is to dilate the narrow area. This means to stretch it.  When this is not successful, chest surgery may be required. This is a much more extensive form of treatment with a longer recovery time. Both of the above treatments make the passage of food and water into the stomach easier. They  also make it easier for stomach contents to bubble back into the esophagus. Special medications may be used following the procedure to help prevent further narrowing. Medications may be used to lower the amount of acid in the stomach juice.  SEEK IMMEDIATE MEDICAL CARE IF:   Your swallowing is becoming more painful, difficult, or you are unable to swallow.  You vomit up blood.  You develop black tarry stools.  You develop chills.  You have a fever.  You develop chest or abdominal pain.  You develop shortness of breath, feel lightheaded, or faint. Follow up with medical care as your caregiver suggests. Document Released: 11/26/2005 Document Revised: 06/10/2011 Document Reviewed: 01/02/2006 [facility] Patient Information 2014 [place], [place].

## 2012-09-15 NOTE — Progress Notes (Signed)
Subjective:    Patient ID: Nancy Hall, female    DOB: [birth year], 42 y.o.   MRN: [id number]  HPI  42 yo female with h/o MR, seizures, depression with psychosis and pseudoseizures, hypothyroidism here with her mom for :  Difficulty swallowing- feels like food is getting stuck in her throat.  First noticed symptoms 6 months ago, getting progressively worse.  Never occurs with liquids.  She has not noticed if certain foods are worse than others. She has noticed it is worse if she lays down right after eating.  Did vomit once because sensation was so severe.  No fevers.    Patient Active Problem List   Diagnosis Date Noted  . Vaginitis and vulvovaginitis 09/08/2012  . Facial rash 09/08/2012  . 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 Abdominal pain 11/15/2011  . Gynecological examination 06/24/2011  . Hypothyroidism 05/30/2011  . Depression, major, recurrent, severe with psychosis 05/30/2011  . MR (mental retardation) 05/30/2011  . Bipolar disorder 05/30/2011  . Sleep apnea 05/30/2011  . Menorrhagia 05/30/2011  . Seizures    Past Medical History  Diagnosis Date  . Depression   . Thyroid disease   . Seizures     according to echart- seizures vs pseudoseizures   No past surgical history on file. History  Substance Use Topics  . Smoking status: Never Smoker   . Smokeless tobacco: Never Used  . Alcohol Use: No   No family history on file. Allergies  Allergen Reactions  . Penicillins Rash   Current Outpatient Prescriptions on File Prior to Visit  Medication Sig Dispense Refill  . ARIPiprazole (ABILIFY) 15 MG tablet Take 15 mg by mouth daily.      . Fluticasone-Salmeterol (ADVAIR) 100-50 MCG/DOSE AEPB Inhale 1 puff into the lungs every 12 (twelve) hours.      Colleen Can FE 1.5/30 1.5-30 MG-MCG tablet TAKE 1 TABLET BY MOUTH DAILY.  28 tablet  5  . lamoTRIgine (LAMICTAL) 150 MG tablet Take 150 mg by mouth 2 (two) times daily.       Marland Kitchen levothyroxine (SYNTHROID) 300 MCG tablet Take 1 tablet (300 mcg total) by mouth daily.      Marland Kitchen levothyroxine (SYNTHROID, LEVOTHROID) 125 MCG tablet TAKE 2 TABLETS BY MOUTH EVERY DAY  60 tablet  5  . levothyroxine (SYNTHROID, LEVOTHROID) 50 MCG tablet TAKE 1 TABLET BY MOUTH EVERY DAY  30 tablet  5  . liothyronine (CYTOMEL) 25 MCG tablet TAKE 1 TABLET BY MOUTH EVERY DAY  30 tablet  11  . metroNIDAZOLE (METROGEL) 0.75 % gel Apply topically 2 (two) times daily.  45 g  0  . sertraline (ZOLOFT) 100 MG tablet Take one by mouth daily      . terconazole (TERAZOL 3) 0.8 % vaginal cream Place 1 applicator vaginally at bedtime.  20 g  0  . topiramate (TOPAMAX) 100 MG tablet Take one by mouth in the morning and one and a half at night  225 tablet  1   No current facility-administered medications on file prior to visit.   The PMH, PSH, Social History, Family History, Medications, and allergies have been reviewed in [facility], and have been updated if relevant.   Review of Systems See HPI      Objective:   Physical Exam BP 112/82  Pulse 60  Temp(Src) 97.9 F (36.6 C)  Wt 267 lb (121.11 kg)  BMI 43.12 kg/m2  General:  Well-developed,well-nourished,in no acute distress; alert,appropriate and cooperative throughout examination  Head:  normocephalic and atraumatic.   Eyes:  vision grossly intact, pupils equal, pupils round, and pupils reactive to light.   Lungs:  Normal respiratory effort, chest expands symmetrically. Lungs are clear to auscultation, no crackles or wheezes. Heart:  Normal rate and regular rhythm. S1 and S2 normal without gallop, murmur, click, rub or other extra sounds. Abdomen:  Bowel sounds positive,abdomen soft and non-tender without masses, organomegaly or hernias noted. Msk:  No deformity or scoliosis noted of thoracic or lumbar spine.   Extremities:  No clubbing, cyanosis, edema, or deformity noted with normal full range of motion of all joints.    Assessment & Plan:   1.  Dysphagia, unspecified(787.20) ?esophageal stricture/achalasia. Will refer to GI for endoscopy/dilation. The patient indicates understanding of these issues and agrees with the plan.  - Ambulatory referral to Gastroenterology

## 2012-09-21 ENCOUNTER — Encounter: Payer: Self-pay | Admitting: Gastroenterology

## 2012-09-21 ENCOUNTER — Ambulatory Visit (INDEPENDENT_AMBULATORY_CARE_PROVIDER_SITE_OTHER): Payer: Medicare Other | Admitting: Gastroenterology

## 2012-09-21 VITALS — BP 90/60 | HR 80 | Ht 65.35 in | Wt 269.2 lb

## 2012-09-21 DIAGNOSIS — R1319 Other dysphagia: Secondary | ICD-10-CM

## 2012-09-21 DIAGNOSIS — K219 Gastro-esophageal reflux disease without esophagitis: Secondary | ICD-10-CM

## 2012-09-21 MED ORDER — OMEPRAZOLE 40 MG PO CPDR: 40.0000 mg | DELAYED_RELEASE_CAPSULE | Freq: Every day | ORAL | Status: DC

## 2012-09-21 NOTE — Progress Notes (Signed)
History of Present Illness: This is a 42 year old female accompanied by her mother who relates worsening problems with solid food dysphagia over the past 6 months. Symptoms have significantly worsened over the past month. She had an episode that sounds like a temporary food impaction with inability to handle secretions and repeated vomiting for several hours and then it resolved. She has intermittent heartburn and reflux symptoms. Denies weight loss, abdominal pain, constipation, diarrhea, change in stool caliber, melena, hematochezia, nausea, vomiting, chest pain.   Review of Systems: Pertinent positive and negative review of systems were noted in the above HPI section. All other review of systems were otherwise negative.  Current Medications, Allergies, Past Medical History, Past Surgical History, Family History and Social History were reviewed in [employer] record.  Physical Exam: General: Well developed , well nourished, obese, no acute distress Head: Normocephalic and atraumatic Eyes:  sclerae anicteric, EOMI Ears: Normal auditory acuity Mouth: No deformity or lesions Neck: Supple, no masses or thyromegaly Lungs: Clear throughout to auscultation Heart: Regular rate and rhythm; no murmurs, rubs or bruits Abdomen: Soft, non tender and non distended. No masses, hepatosplenomegaly or hernias noted. Normal Bowel sounds Musculoskeletal: Symmetrical with no gross deformities  Skin: No lesions on visible extremities Pulses:  Normal pulses noted Extremities: No clubbing, cyanosis, edema or deformities noted Neurological: Alert oriented x 4, grossly nonfocal Cervical Nodes:  No significant cervical adenopathy Inguinal Nodes: No significant inguinal adenopathy Psychological:  Alert and cooperative. Normal mood and affect  Assessment and Recommendations:  1. Dysphagia and GERD. Rule out esophagitis, stricture and other disorders. Begin omeprazole 40 mg daily and standard  antireflux measures. The risks, benefits, and alternatives to endoscopy with possible biopsy and possible dilation were discussed with the patient and they consent to proceed.

## 2012-09-21 NOTE — Patient Instructions (Addendum)
You have been scheduled for an endoscopy with propofol. Please follow written instructions given to you at your visit today. If you use inhalers (even only as needed), please bring them with you on the day of your procedure. Your physician has requested that you go to www.startemmi.com and enter the access code given to you at your visit today. This web site gives a general overview about your procedure. However, you should still follow specific instructions given to you by our office regarding your preparation for the procedure.  We have sent the following medications to your pharmacy for you to pick up at your convenience: Omeprazole.  Patient advised to avoid spicy, acidic, citrus, chocolate, mints, fruit and fruit juices.  Limit the intake of caffeine, alcohol and Soda.  Don't exercise too soon after eating.  Don't lie down within 3-4 hours of eating.  Elevate the head of your bed.  Thank you for choosing me and [facility].  Venita Lick. Pleas Koch., MD., [facility] Graham

## 2012-09-24 ENCOUNTER — Encounter: Payer: Self-pay | Admitting: Gastroenterology

## 2012-09-24 ENCOUNTER — Ambulatory Visit (AMBULATORY_SURGERY_CENTER): Payer: Medicare Other | Admitting: Gastroenterology

## 2012-09-24 VITALS — BP 112/75 | HR 74 | Temp 97.7°F | Resp 16 | Ht 65.0 in | Wt 269.0 lb

## 2012-09-24 DIAGNOSIS — R1310 Dysphagia, unspecified: Secondary | ICD-10-CM | POA: Diagnosis not present

## 2012-09-24 DIAGNOSIS — F341 Dysthymic disorder: Secondary | ICD-10-CM | POA: Diagnosis not present

## 2012-09-24 DIAGNOSIS — K219 Gastro-esophageal reflux disease without esophagitis: Secondary | ICD-10-CM

## 2012-09-24 DIAGNOSIS — E039 Hypothyroidism, unspecified: Secondary | ICD-10-CM | POA: Diagnosis not present

## 2012-09-24 DIAGNOSIS — R1319 Other dysphagia: Secondary | ICD-10-CM | POA: Diagnosis not present

## 2012-09-24 DIAGNOSIS — E669 Obesity, unspecified: Secondary | ICD-10-CM | POA: Diagnosis not present

## 2012-09-24 DIAGNOSIS — F319 Bipolar disorder, unspecified: Secondary | ICD-10-CM | POA: Diagnosis not present

## 2012-09-24 MED ORDER — SODIUM CHLORIDE 0.9 % IV SOLN: 500.0000 mL | INTRAVENOUS | Status: DC

## 2012-09-24 NOTE — Progress Notes (Signed)
Called to room to assist during endoscopic procedure.  Patient ID and intended procedure confirmed with present staff. Received instructions for my participation in the procedure from the performing physician. ewm 

## 2012-09-24 NOTE — Progress Notes (Signed)
Lidocaine-40mg IV prior to Propofol InductionPropofol given over incremental dosages 

## 2012-09-24 NOTE — Op Note (Signed)
[facility] [address] [place], [address]   ENDOSCOPY PROCEDURE REPORT  PATIENT: Nancy Hall, Nancy Hall  MR#: [id number] BIRTHDATE: Apr 08, 1970 , 41  yrs. old GENDER: Female ENDOSCOPIST: Meryl Dare, MD, [facility] Graham ASSISTANT: REFERRED XB:JYNWG Aron, M.D. PROCEDURE DATE:  09/24/2012 PROCEDURE:   EGD with dilatation over guidewire ASA CLASS:   Class II INDICATIONS:dysphagia. MEDICATIONS: MAC sedation, administered by CRNA and propofol (Diprivan) 200mg  IV TOPICAL ANESTHETIC:   none DESCRIPTION OF PROCEDURE:   After the risks benefits and alternatives of the procedure were thoroughly explained, informed consent was obtained.  The LB [id number]  endoscope was introduced through the mouth  and advanced to the descending duodenum ,      The instrument was slowly withdrawn as the mucosa was carefully examined.  ESOPHAGUS: There was LA Class A esophagitis noted.  The esophagus was otherwise normal. STOMACH: A 4-5 cm hiatal hernia was found in the cardia. The stomach otherwise appeared normal. DUODENUM: The duodenal mucosa showed no abnormalities in the bulb and second portion of the duodenum.  Dilation was then performed at the distal esophagus. Dilator:Savary over guidewire Size:17 mm Reststance:none Heme:yes, trace  COMPLICATIONS: There were no complications.  ENDOSCOPIC IMPRESSION: 1.   LA Class A esophagitis 2.   Hiatal hernia  RECOMMENDATIONS: 1.  anti-reflux regimen long term 2.  continue PPI daily long term 3.  post dilation instructions 4.  Office follow up in 4-6 week   eSigned:  Meryl Dare, MD, [facility] 09/24/2012 3:22 PM  :

## 2012-09-24 NOTE — Progress Notes (Signed)
Patient did not experience any of the following events: a burn prior to discharge; a fall within the facility; wrong site/side/patient/procedure/implant event; or a hospital transfer or hospital admission upon discharge from the facility. (G8907) Patient did not have preoperative order for IV antibiotic SSI prophylaxis. (G8918)  

## 2012-09-24 NOTE — Patient Instructions (Signed)
YOU HAD AN ENDOSCOPIC PROCEDURE TODAY AT THE  ENDOSCOPY CENTER: Refer to the procedure report that was given to you for any specific questions about what was found during the examination.  If the procedure report does not answer your questions, please call your gastroenterologist to clarify.  If you requested that your care partner not be given the details of your procedure findings, then the procedure report has been included in a sealed envelope for you to review at your convenience later.  YOU SHOULD EXPECT: Some feelings of bloating in the abdomen. Passage of more gas than usual.  Walking can help get rid of the air that was put into your GI tract during the procedure and reduce the bloating. If you had a lower endoscopy (such as a colonoscopy or flexible sigmoidoscopy) you may notice spotting of blood in your stool or on the toilet paper. If you underwent a bowel prep for your procedure, then you may not have a normal bowel movement for a few days.  DIET:  NOTHING TO EAT OR DRINK UNTIL 4:30. 4:30 UNTIL 5:30 ONLY CLEAR LIQUIDS. 5:30 UNTIL MORNING ONLY SOFT FOODS. RESUME YOUR DIET IN AM.    ACTIVITY: Your care partner should take you home directly after the procedure.  You should plan to take it easy, moving slowly for the rest of the day.  You can resume normal activity the day after the procedure however you should NOT DRIVE or use heavy machinery for 24 hours (because of the sedation medicines used during the test).    SYMPTOMS TO REPORT IMMEDIATELY: A gastroenterologist can be reached at any hour.  During normal business hours, 8:30 AM to 5:00 PM Monday through Friday, call [phone].  After hours and on weekends, please call the GI answering service at [phone] who will take a message and have the physician on call contact you.   Following upper endoscopy (EGD)  Vomiting of blood or coffee ground material  New chest pain or pain under the shoulder blades  Painful or  persistently difficult swallowing  New shortness of breath  Fever of 100F or higher  Black, tarry-looking stools  FOLLOW UP: If any biopsies were taken you will be contacted by phone or by letter within the next 1-3 weeks.  Call your gastroenterologist if you have not heard about the biopsies in 3 weeks.  Our staff will call the home number listed on your records the next business day following your procedure to check on you and address any questions or concerns that you may have at that time regarding the information given to you following your procedure. This is a courtesy call and so if there is no answer at the home number and we have not heard from you through the emergency physician on call, we will assume that you have returned to your regular daily activities without incident.  SIGNATURES/CONFIDENTIALITY: You and/or your care partner have signed paperwork which will be entered into your electronic medical record.  These signatures attest to the fact that that the information above on your After Visit Summary has been reviewed and is understood.  Full responsibility of the confidentiality of this discharge information lies with you and/or your care-partner.

## 2012-09-25 ENCOUNTER — Telehealth: Payer: Self-pay

## 2012-09-25 NOTE — Telephone Encounter (Signed)
  Follow up Call-  Call back number 09/24/2012  Post procedure Call Back phone  # [phone]  Permission to leave phone message Yes     Patient questions:  Do you have a fever, pain , or abdominal swelling? no Pain Score  0 *  Have you tolerated food without any problems? yes  Have you been able to return to your normal activities? yes  Do you have any questions about your discharge instructions: Diet   no Medications  no Follow up visit  no  Do you have questions or concerns about your Care? no  Actions: * If pain score is 4 or above: No action needed, pain <4.

## 2012-11-24 ENCOUNTER — Ambulatory Visit (INDEPENDENT_AMBULATORY_CARE_PROVIDER_SITE_OTHER): Payer: Medicare Other | Admitting: Gastroenterology

## 2012-11-24 ENCOUNTER — Ambulatory Visit: Payer: Medicare Other | Admitting: Gastroenterology

## 2012-11-24 ENCOUNTER — Encounter: Payer: Self-pay | Admitting: Gastroenterology

## 2012-11-24 VITALS — BP 100/78 | HR 76 | Ht 65.25 in | Wt 278.1 lb

## 2012-11-24 DIAGNOSIS — K2100 Gastro-esophageal reflux disease with esophagitis, without bleeding: Secondary | ICD-10-CM

## 2012-11-24 NOTE — Progress Notes (Signed)
History of Present Illness: This is a 42 year old female accompanied by her mother. She returns for followup of GERD with erosive esophagitis. Her dysphagia symptoms have resolved. She has no gastrointestinal complaints.  Current Medications, Allergies, Past Medical History, Past Surgical History, Family History and Social History were reviewed in [employer] record.  Physical Exam: General: Well developed , well nourished, no acute distress Head: Normocephalic and atraumatic Eyes:  sclerae anicteric, EOMI Ears: Normal auditory acuity Mouth: No deformity or lesions Lungs: Clear throughout to auscultation Heart: Regular rate and rhythm; no murmurs, rubs or bruits Abdomen: Soft, non tender and non distended. No masses, hepatosplenomegaly or hernias noted. Normal Bowel sounds Musculoskeletal: Symmetrical with no gross deformities  Pulses:  Normal pulses noted Extremities: No clubbing, cyanosis, edema or deformities noted Neurological: Alert oriented x 4, grossly nonfocal Psychological:  Alert and cooperative. Normal mood and affect  Assessment and Recommendations:  1. GERD with LA Class Grade A erosive esophagitis. Continue omeprazole 40 mg po qam long-term and antireflux measures long term. Routine follow up and medication refills with Dr. Dayton Martes. GI follow up prn.

## 2012-11-24 NOTE — Patient Instructions (Addendum)
Continue omeprazole daily.   Follow up with your Primary Care Physician.   Patient advised to avoid spicy, acidic, citrus, chocolate, mints, fruit and fruit juices.  Limit the intake of caffeine, alcohol and Soda.  Don't exercise too soon after eating.  Don't lie down within 3-4 hours of eating.  Elevate the head of your bed.  Thank you for choosing me and [facility].  Venita Lick. Pleas Koch., MD., Clementeen Graham

## 2012-12-31 ENCOUNTER — Encounter: Payer: Self-pay | Admitting: Family Medicine

## 2012-12-31 ENCOUNTER — Ambulatory Visit (INDEPENDENT_AMBULATORY_CARE_PROVIDER_SITE_OTHER): Payer: Medicare Other | Admitting: Family Medicine

## 2012-12-31 VITALS — BP 112/76 | HR 72 | Temp 98.1°F | Wt 271.2 lb

## 2012-12-31 DIAGNOSIS — Z23 Encounter for immunization: Secondary | ICD-10-CM | POA: Diagnosis not present

## 2012-12-31 DIAGNOSIS — R32 Unspecified urinary incontinence: Secondary | ICD-10-CM | POA: Diagnosis not present

## 2012-12-31 LAB — POCT URINALYSIS DIPSTICK
Glucose, UA: NEGATIVE
Nitrite, UA: NEGATIVE
Spec Grav, UA: <=1.005
Urobilinogen, UA: 0.2
pH, UA: 6

## 2012-12-31 LAB — BASIC METABOLIC PANEL
BUN: 9 mg/dL (ref 6–23)
CO2: 20 mEq/L (ref 19–32)
Calcium: 8.5 mg/dL (ref 8.4–10.5)
Creatinine, Ser: 1 mg/dL (ref 0.4–1.2)
GFR: 62.31 mL/min (ref >60.00)
Glucose, Bld: 101 mg/dL — ABNORMAL HIGH (ref 70–99)
Sodium: 139 mEq/L (ref 135–145)

## 2012-12-31 NOTE — Patient Instructions (Addendum)
Good to see you. We will call you with your lab results.  Please drop off a urine sample at your convenience.

## 2012-12-31 NOTE — Progress Notes (Signed)
Subjective:    Patient ID: Nancy Hall, female    DOB: 01/05/1971, 42 y.o.   MRN: [id number]  HPI  42 yo pleasant female with h/o MR, seizures, depression with psychosis and pseudoseizures, hypothyroidism here by herself (typically here with her mom) for ?DM and UI.  UI- she is not a great historian but describes urinary urge incontinence without stress incontinence for several months.  No dysuria.  No hematuria.  No fever.  No back pain.  Wants to be screened for DM too.  She does take Abilify, followed by psychiatry.  Does have FH of DM. No increased thirst that she is aware of.   Patient Active Problem List   Diagnosis Date Noted  . Dysphagia, unspecified(787.20) 09/15/2012  . Vaginitis and vulvovaginitis 09/08/2012  . Facial rash 09/08/2012  . 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 Abdominal pain 11/15/2011  . Gynecological examination 06/24/2011  . Hypothyroidism 05/30/2011  . Depression, major, recurrent, severe with psychosis 05/30/2011  . MR (mental retardation) 05/30/2011  . Bipolar disorder 05/30/2011  . Sleep apnea 05/30/2011  . Menorrhagia 05/30/2011  . Seizures    Past Medical History  Diagnosis Date  . Depression   . Thyroid disease   . Seizures     according to echart- seizures vs pseudoseizures  . Anxiety   . Sleep apnea   . Esophagitis     LA Class A  . Hiatal hernia    Past Surgical History  Procedure Laterality Date  . Inner ear surgery Bilateral 1994    poor historian   History  Substance Use Topics  . Smoking status: Never Smoker   . Smokeless tobacco: Never Used  . Alcohol Use: No   Family History  Problem Relation Age of Onset  . Esophageal cancer Maternal Grandmother   . Colon polyps Mother   . Celiac disease Mother   . Diabetes Paternal Grandmother   . Heart disease Paternal Grandfather   . Colon polyps Father   . Colon polyps Paternal Aunt   . Celiac disease Brother   . Diabetes  Maternal Aunt   . Heart disease Maternal Grandfather   . Heart disease Maternal Uncle   . Heart disease Maternal Aunt    Allergies  Allergen Reactions  . Penicillins Rash   Current Outpatient Prescriptions on File Prior to Visit  Medication Sig Dispense Refill  . ARIPiprazole (ABILIFY) 15 MG tablet Take 15 mg by mouth daily.      Colleen Can FE 1.5/30 1.5-30 MG-MCG tablet TAKE 1 TABLET BY MOUTH DAILY.  28 tablet  5  . lamoTRIgine (LAMICTAL) 150 MG tablet Take 150 mg by mouth 2 (two) times daily.      Marland Kitchen levothyroxine (SYNTHROID) 300 MCG tablet Take 1 tablet (300 mcg total) by mouth daily.      Marland Kitchen levothyroxine (SYNTHROID, LEVOTHROID) 125 MCG tablet TAKE 2 TABLETS BY MOUTH EVERY DAY  60 tablet  5  . levothyroxine (SYNTHROID, LEVOTHROID) 50 MCG tablet TAKE 1 TABLET BY MOUTH EVERY DAY  30 tablet  5  . liothyronine (CYTOMEL) 25 MCG tablet TAKE 1 TABLET BY MOUTH EVERY DAY  30 tablet  11  . metroNIDAZOLE (METROGEL) 0.75 % gel Apply topically 2 (two) times daily.  45 g  0  . omeprazole (PRILOSEC) 40 MG capsule Take 1 capsule (40 mg total) by mouth daily.  90 capsule  3  . sertraline (ZOLOFT) 100 MG tablet Take one by mouth daily      .  terconazole (TERAZOL 3) 0.8 % vaginal cream Place 1 applicator vaginally at bedtime.  20 g  0  . topiramate (TOPAMAX) 100 MG tablet Take one by mouth in the morning and one and a half at night  225 tablet  1   No current facility-administered medications on file prior to visit.   The PMH, PSH, Social History, Family History, Medications, and allergies have been reviewed in [facility], and have been updated if relevant.      Patient Active Problem List   Diagnosis Date Noted  . Dysphagia, unspecified(787.20) 09/15/2012  . Vaginitis and vulvovaginitis 09/08/2012  . Facial rash 09/08/2012  . 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 Abdominal pain 11/15/2011  . Gynecological examination 06/24/2011  . Hypothyroidism  05/30/2011  . Depression, major, recurrent, severe with psychosis 05/30/2011  . MR (mental retardation) 05/30/2011  . Bipolar disorder 05/30/2011  . Sleep apnea 05/30/2011  . Menorrhagia 05/30/2011  . Seizures    Past Medical History  Diagnosis Date  . Depression   . Thyroid disease   . Seizures     according to echart- seizures vs pseudoseizures  . Anxiety   . Sleep apnea   . Esophagitis     LA Class A  . Hiatal hernia    Past Surgical History  Procedure Laterality Date  . Inner ear surgery Bilateral 1994    poor historian   History  Substance Use Topics  . Smoking status: Never Smoker   . Smokeless tobacco: Never Used  . Alcohol Use: No   Family History  Problem Relation Age of Onset  . Esophageal cancer Maternal Grandmother   . Colon polyps Mother   . Celiac disease Mother   . Diabetes Paternal Grandmother   . Heart disease Paternal Grandfather   . Colon polyps Father   . Colon polyps Paternal Aunt   . Celiac disease Brother   . Diabetes Maternal Aunt   . Heart disease Maternal Grandfather   . Heart disease Maternal Uncle   . Heart disease Maternal Aunt    Allergies  Allergen Reactions  . Penicillins Rash   Current Outpatient Prescriptions on File Prior to Visit  Medication Sig Dispense Refill  . ARIPiprazole (ABILIFY) 15 MG tablet Take 15 mg by mouth daily.      Colleen Can FE 1.5/30 1.5-30 MG-MCG tablet TAKE 1 TABLET BY MOUTH DAILY.  28 tablet  5  . lamoTRIgine (LAMICTAL) 150 MG tablet Take 150 mg by mouth 2 (two) times daily.      Marland Kitchen levothyroxine (SYNTHROID) 300 MCG tablet Take 1 tablet (300 mcg total) by mouth daily.      Marland Kitchen levothyroxine (SYNTHROID, LEVOTHROID) 125 MCG tablet TAKE 2 TABLETS BY MOUTH EVERY DAY  60 tablet  5  . levothyroxine (SYNTHROID, LEVOTHROID) 50 MCG tablet TAKE 1 TABLET BY MOUTH EVERY DAY  30 tablet  5  . liothyronine (CYTOMEL) 25 MCG tablet TAKE 1 TABLET BY MOUTH EVERY DAY  30 tablet  11  . metroNIDAZOLE (METROGEL) 0.75 % gel Apply  topically 2 (two) times daily.  45 g  0  . omeprazole (PRILOSEC) 40 MG capsule Take 1 capsule (40 mg total) by mouth daily.  90 capsule  3  . sertraline (ZOLOFT) 100 MG tablet Take one by mouth daily      . terconazole (TERAZOL 3) 0.8 % vaginal cream Place 1 applicator vaginally at bedtime.  20 g  0  . topiramate (TOPAMAX) 100 MG tablet Take one  by mouth in the morning and one and a half at night  225 tablet  1   No current facility-administered medications on file prior to visit.   The PMH, PSH, Social History, Family History, Medications, and allergies have been reviewed in [facility], and have been updated if relevant.   Review of Systems See HPI      Objective:   Physical Exam BP 112/76  Pulse 72  Temp(Src) 98.1 F (36.7 C) (Oral)  Wt 271 lb 4 oz (123.038 kg)  BMI 44.81 kg/m2  SpO2 97%  LMP 12/10/2012  General:  Well-developed,well-nourished,in no acute distress; alert,appropriate and cooperative throughout examination Head:  normocephalic and atraumatic.   Eyes:  vision grossly intact, pupils equal, pupils round, and pupils reactive to light.   Lungs:  Normal respiratory effort, chest expands symmetrically. Lungs are clear to auscultation, no crackles or wheezes. Heart:  Normal rate and regular rhythm. S1 and S2 normal without gallop, murmur, click, rub or other extra sounds. Abdomen:  Bowel sounds positive,abdomen soft and non-tender without masses, organomegaly or hernias noted. Msk:  No deformity or scoliosis noted of thoracic or lumbar spine.   Extremities:  No clubbing, cyanosis, edema, or deformity noted with normal full range of motion of all joints.   Psych:  MR baseline  Assessment & Plan:    1. Urinary incontinence Unable to leave urine sample.  Given specimen cup and wipe to take home to return UA. Check A1c and CMET as well today. The patient indicates understanding of these issues and agrees with the plan.  - Hemoglobin A1c - Urinalysis Dipstick

## 2012-12-31 NOTE — Addendum Note (Signed)
Addended by: Damita Lack on: 12/31/2012 02:28 PM   Modules accepted: Orders

## 2013-01-01 LAB — URINE CULTURE

## 2013-01-04 ENCOUNTER — Other Ambulatory Visit: Payer: Self-pay | Admitting: Family Medicine

## 2013-01-04 MED ORDER — CIPROFLOXACIN HCL 500 MG PO TABS: 500.0000 mg | ORAL_TABLET | Freq: Two times a day (BID) | ORAL | Status: DC

## 2013-01-28 ENCOUNTER — Other Ambulatory Visit: Payer: Self-pay | Admitting: Family Medicine

## 2013-02-03 ENCOUNTER — Telehealth: Payer: Self-pay | Admitting: Nurse Practitioner

## 2013-02-03 NOTE — Telephone Encounter (Signed)
called patient to reschedule appt, i was asked to  call back tomorrow when her mom get home to reschedule.

## 2013-02-04 ENCOUNTER — Other Ambulatory Visit: Payer: Self-pay | Admitting: Family Medicine

## 2013-03-05 ENCOUNTER — Ambulatory Visit: Payer: Medicare Other | Admitting: Nurse Practitioner

## 2013-03-24 ENCOUNTER — Other Ambulatory Visit: Payer: Self-pay | Admitting: Nurse Practitioner

## 2013-04-27 ENCOUNTER — Other Ambulatory Visit: Payer: Self-pay | Admitting: Family Medicine

## 2013-04-28 NOTE — Telephone Encounter (Signed)
Pt requesting medication refill. Last ov 12/2012 with no future appts scheduled. last thyroid labs in 05/2012. pls advise

## 2013-04-28 NOTE — Telephone Encounter (Signed)
The last thyroid med Rx was for 125 mcg in 12/2012, the request is for 63mcg which was last refilled in 05/2012. Which one should i send for refill? pls advise

## 2013-04-28 NOTE — Telephone Encounter (Signed)
Ok to refill one month only.  Needs to be seen for labs before further refills.

## 2013-04-29 NOTE — Telephone Encounter (Signed)
I think she is taking both doses, please verify with pt's mom.

## 2013-04-29 NOTE — Telephone Encounter (Signed)
Spoke to pts mother who states that the pt is on 139mcg, 59mcg, and 39mcg, but is only needing refill of 14mcg at this time. I have sent in the Rx

## 2013-04-30 ENCOUNTER — Other Ambulatory Visit: Payer: Self-pay | Admitting: Nurse Practitioner

## 2013-05-05 DIAGNOSIS — H903 Sensorineural hearing loss, bilateral: Secondary | ICD-10-CM | POA: Diagnosis not present

## 2013-05-05 DIAGNOSIS — H60399 Other infective otitis externa, unspecified ear: Secondary | ICD-10-CM | POA: Diagnosis not present

## 2013-05-05 DIAGNOSIS — H6980 Other specified disorders of Eustachian tube, unspecified ear: Secondary | ICD-10-CM | POA: Diagnosis not present

## 2013-05-10 ENCOUNTER — Ambulatory Visit (INDEPENDENT_AMBULATORY_CARE_PROVIDER_SITE_OTHER): Payer: Medicare Other | Admitting: Nurse Practitioner

## 2013-05-10 ENCOUNTER — Encounter: Payer: Self-pay | Admitting: Nurse Practitioner

## 2013-05-10 VITALS — BP 121/79 | HR 93 | Ht 66.0 in | Wt 284.0 lb

## 2013-05-10 DIAGNOSIS — R569 Unspecified convulsions: Secondary | ICD-10-CM | POA: Diagnosis not present

## 2013-05-10 DIAGNOSIS — G40309 Generalized idiopathic epilepsy and epileptic syndromes, not intractable, without status epilepticus: Secondary | ICD-10-CM

## 2013-05-10 DIAGNOSIS — F445 Conversion disorder with seizures or convulsions: Secondary | ICD-10-CM | POA: Insufficient documentation

## 2013-05-10 DIAGNOSIS — F319 Bipolar disorder, unspecified: Secondary | ICD-10-CM

## 2013-05-10 DIAGNOSIS — F333 Major depressive disorder, recurrent, severe with psychotic symptoms: Secondary | ICD-10-CM | POA: Diagnosis not present

## 2013-05-10 MED ORDER — TOPIRAMATE 100 MG PO TABS: ORAL_TABLET | ORAL | Status: DC

## 2013-05-10 NOTE — Progress Notes (Signed)
[facility]  PATIENT: Nancy Hall DOB: June 01, 1970   REASON FOR VISIT: Followup for seizure disorder   HISTORY OF PRESENT ILLNESS:Nancy Hall, 43 year old female returns for followup. She has a history of seizures as well as pseudoseizures confirmed by epilepsy unit at [facility]. She is currently on Topamax 100 in the morning and 150 at night. In addition she is on Lamictal 150 mg twice daily along with Abilify and Zoloft. Her father died suddenly in late 01/25/23 of last year after having a hip replacement, apparently came home from the hospital and had a massive heart attack at home.  According to the mom [name] has had more pseudoseizures seizures since that time and she has been seeing her counselor more frequently. She is also continuing to gain weight additional 10-12 pounds since last seen. She does not get any exercise. She returns for followup.   HISTORY: She has a history of seizures and pseudoseizures and significant psychiatric disease. She also has a history of obesity. She is currently on Topamax for her seizure events. She is on Lamictal for her pseudoseizures. She sees a Social worker once a month and her psychiatrist every 3 months. Mother reports today that she has been doing much better since last seen. She has had 2 emotional seizures in the last 6 months. She needs refills on her medications. Routine and sleep deprived EEG have been normal in the past. Her pseudoseizures were diagnosed at the epilepsy unit at [facility]: Full 14 system review of systems performed and notable only for those listed, all others are neg:  Constitutional: Weight gain Cardiovascular: N/A  Ear/Nose/Throat: N/A  Skin: N/A  Eyes: N/A  Respiratory: N/A  Gastroitestinal: N/A  Hematology/Lymphatic: N/A  Endocrine: N/A Musculoskeletal:N/A  Allergy/Immunology: N/A  Neurological: seizure Psychiatric: depression/anxiety ALLERGIES: Allergies    Allergen Reactions  . Penicillins Rash    HOME MEDICATIONS: Outpatient Prescriptions Prior to Visit  Medication Sig Dispense Refill  . ARIPiprazole (ABILIFY) 15 MG tablet Take 15 mg by mouth daily.      . ciprofloxacin (CIPRO) 500 MG tablet Take 1 tablet (500 mg total) by mouth 2 (two) times daily.  6 tablet  0  . JUNEL FE 1.5/30 1.5-30 MG-MCG tablet TAKE 1 TABLET BY MOUTH DAILY.  28 tablet  5  . lamoTRIgine (LAMICTAL) 150 MG tablet Take 150 mg by mouth 2 (two) times daily.      Marland Kitchen levothyroxine (SYNTHROID) 300 MCG tablet Take 1 tablet (300 mcg total) by mouth daily.      Marland Kitchen levothyroxine (SYNTHROID, LEVOTHROID) 125 MCG tablet TAKE 2 TABLETS BY MOUTH EVERY DAY  60 tablet  5  . levothyroxine (SYNTHROID, LEVOTHROID) 50 MCG tablet TAKE 1 TABLET BY MOUTH EVERY DAY  30 tablet  5  . liothyronine (CYTOMEL) 25 MCG tablet TAKE 1 TABLET BY MOUTH EVERY DAY  30 tablet  11  . metroNIDAZOLE (METROGEL) 0.75 % gel Apply topically 2 (two) times daily.  45 g  0  . omeprazole (PRILOSEC) 40 MG capsule Take 1 capsule (40 mg total) by mouth daily.  90 capsule  3  . sertraline (ZOLOFT) 100 MG tablet Take one by mouth daily      . terconazole (TERAZOL 3) 0.8 % vaginal cream Place 1 applicator vaginally at bedtime.  20 g  0  . topiramate (TOPAMAX) 100 MG tablet TAKE 1 TABLET IN THE MORNING AND 1 & 1/2 TABLETS AT NIGHT  225 tablet  0  . topiramate (TOPAMAX) 100 MG tablet TAKE 1 TABLET IN THE MORNING AND 1 & 1/2 TABLETS AT NIGHT  225 tablet  0   No facility-administered medications prior to visit.    PAST MEDICAL HISTORY: Past Medical History  Diagnosis Date  . Depression   . Thyroid disease   . Seizures     according to echart- seizures vs pseudoseizures  . Anxiety   . Sleep apnea   . Esophagitis     LA Class A  . Hiatal hernia   . Pseudoseizures     PAST SURGICAL HISTORY: Past Surgical History  Procedure Laterality Date  . Inner ear surgery Bilateral 1994    poor historian    FAMILY  HISTORY: Family History  Problem Relation Age of Onset  . Colon polyps Mother   . Celiac disease Mother   . Diabetes Paternal Grandmother   . Heart disease Paternal Grandfather   . Colon polyps Father   . Colon polyps Paternal Aunt   . Celiac disease Brother   . Diabetes Maternal Aunt   . Heart disease Maternal Grandfather   . Heart disease Maternal Uncle   . Heart disease Maternal Aunt     SOCIAL HISTORY: History   Social History  . Marital Status: Single    Spouse Name: N/A    Number of Children: 0  . Years of Education: 10   Occupational History  . Not on file.   Social History Main Topics  . Smoking status: Never Smoker   . Smokeless tobacco: Never Used  . Alcohol Use: No  . Drug Use: No  . Sexual Activity: Not on file   Other Topics Concern  . Not on file   Social History Narrative   Patient lives at home with mom.    Patient does not have any children.    Patient has a 10th grade education.    Patient is single.    Patient is left handed.      PHYSICAL EXAM  Filed Vitals:   05/10/13 0955  BP: 121/79  Pulse: 93  Height: 5\' 6"  (1.676 m)  Weight: 284 lb (128.822 kg)   Body mass index is 45.86 kg/(m^2).  Generalized: Well developed, obese female in no acute distress  Head: normocephalic and atraumatic,. Oropharynx benign  Neck: Supple, no carotid bruits  Cardiac: Regular rate rhythm, no murmur  Musculoskeletal: No deformity   Neurological examination   Mentation: Alert oriented to time, place, history taking. Follows all commands speech and language fluent. Flat affect  Cranial nerve II-XII: Pupils were equal round reactive to light extraocular movements were full, visual field were full on confrontational test. Facial sensation and strength were normal. hearing was intact to finger rubbing bilaterally. Uvula tongue midline. head turning and shoulder shrug were normal and symmetric.Tongue protrusion into cheek strength was normal. Motor: normal  bulk and tone, full strength in the BUE, BLE,  No focal weakness Sensory: normal and symmetric to light touch, pinprick, and  vibration  Coordination: finger-nose-finger, heel-to-shin bilaterally, no dysmetria Reflexes: Brachioradialis 2/2, biceps 2/2, triceps 2/2, patellar 2/2, Achilles 2/2, plantar responses were flexor bilaterally. Gait and Station: Rising up from seated position without assistance, normal stance,  moderate stride, good arm swing, smooth turning, able to perform tiptoe, and heel walking without difficulty. Tandem gait is steady  DIAGNOSTIC DATA (LABS, IMAGING, TESTING) - I reviewed patient records, labs, notes, testing and imaging myself where available.      Component Value Date/Time  NA 139 12/31/2012 1106   K 3.8 12/31/2012 1106   CL 112 12/31/2012 1106   CO2 20 12/31/2012 1106   GLUCOSE 101* 12/31/2012 1106   BUN 9 12/31/2012 1106   CREATININE 1.0 12/31/2012 1106   CALCIUM 8.5 12/31/2012 1106   PROT 6.6 05/06/2012 1150   ALBUMIN 3.6 05/06/2012 1150   AST 15 05/06/2012 1150   ALT 16 05/06/2012 1150   ALKPHOS 67 05/06/2012 1150   BILITOT 0.4 05/06/2012 1150    Lab Results  Component Value Date   HGBA1C 5.6 12/31/2012   ASSESSMENT AND PLAN  43 y.o. year old female  has a past medical history of Depression; Thyroid disease; Seizures; Anxiety; Sleep apnea; Esophagitis; Hiatal hernia; and Pseudoseizures. here to followup. According to the mother who is with her today she has had more "emotional seizures" since her father died unexpectedly in 01/09/13.   Continue Topamax at current dose Will consider increasing Lamictal, will discuss with psych Need to exercise for weight loss, healthy low fat diet F/U in 6 months Late note: Called Dr. Vickie Epley Kinney's office, and spoke with Gala Murdoch, NP. Patient has an appt tomorrow and they will consider an  Increase in  her psych meds. Also long range planning needs to occur since the mother is also ill and Neidy would not be able  to live on her own if her mother died.  Dennie Bible, [facility], [facility], APRN  [facility] [address], [place], [place] [address] [phone]

## 2013-05-10 NOTE — Patient Instructions (Addendum)
Continue Topamax at current dose Will consider increasing Lamictal, will discuss with psych Need to exercise for weight loss, healthy low fat diet F/U in 6 months

## 2013-05-10 NOTE — Progress Notes (Signed)
I have read the note, and I agree with the clinical assessment and plan.  Nancy Hall KEITH   

## 2013-06-15 ENCOUNTER — Ambulatory Visit (INDEPENDENT_AMBULATORY_CARE_PROVIDER_SITE_OTHER): Payer: Medicare Other | Admitting: Family Medicine

## 2013-06-15 ENCOUNTER — Encounter: Payer: Self-pay | Admitting: Family Medicine

## 2013-06-15 VITALS — BP 128/74 | HR 73 | Temp 98.0°F | Ht 65.0 in | Wt 280.0 lb

## 2013-06-15 DIAGNOSIS — N920 Excessive and frequent menstruation with regular cycle: Secondary | ICD-10-CM | POA: Diagnosis not present

## 2013-06-15 DIAGNOSIS — R32 Unspecified urinary incontinence: Secondary | ICD-10-CM | POA: Insufficient documentation

## 2013-06-15 DIAGNOSIS — F333 Major depressive disorder, recurrent, severe with psychotic symptoms: Secondary | ICD-10-CM

## 2013-06-15 DIAGNOSIS — F319 Bipolar disorder, unspecified: Secondary | ICD-10-CM | POA: Diagnosis not present

## 2013-06-15 DIAGNOSIS — G40309 Generalized idiopathic epilepsy and epileptic syndromes, not intractable, without status epilepticus: Secondary | ICD-10-CM

## 2013-06-15 LAB — POCT URINALYSIS DIPSTICK
Bilirubin, UA: NEGATIVE
Glucose, UA: NEGATIVE
KETONES UA: NEGATIVE
Nitrite, UA: NEGATIVE
SPEC GRAV UA: 1.02
Urobilinogen, UA: 0.2
pH, UA: 6.5

## 2013-06-15 MED ORDER — NORETHIN ACE-ETH ESTRAD-FE 1.5-30 MG-MCG PO TABS
ORAL_TABLET | ORAL | Status: DC
Start: 2013-06-15 — End: 2013-11-22

## 2013-06-15 MED ORDER — CIPROFLOXACIN HCL 500 MG PO TABS: 500.0000 mg | ORAL_TABLET | Freq: Two times a day (BID) | ORAL | Status: DC

## 2013-06-15 NOTE — Progress Notes (Signed)
Subjective:    Patient ID: Nancy Hall, female    DOB: [birth year], 43 y.o.   MRN: [id number]  HPI  43 yo female with h/o MR, seizures, depression with psychosis and pseudoseizures, hypothyroidism here with her mom for several concerns:  1.  Menorrhagia-  On Junel for regulation of periods.  Last 6 months- skipping periods or very heavy.  Mom and grandma both were menopausal by the time they were 40. + hot flashes.  2.  Urinary incontinence- past several weeks.  No hematuria, no back pain, nausea or vomiting. Urine cx Jan 20, 2013- probable contaminant,  3.  Seizure d/o - first grandmal seziure at 59 yo.  Has "emotional seizures/pseudoseizures," had a few more around her father's death in 2013/01/20.  Followed by Dr. Jannifer Franklin.  On Topamax 100 mg twice daily, Lamictal 300 mg daily.  He keeps a check on her lamictal levels. Last grand mal seizure over a year ago.   4. Bipolar disorder- followed by pscyhiatry, on Abilify 15 mg daily.  Psychiatrist has medicaid and they no longer accept medicaid- she wants me to take over managing her psychiatric medications.  She still sees Pattricia Boss for counseling.    Lab Results  Component Value Date   TSH 0.06* 05/06/2012   Lab Results  Component Value Date   ALT 16 05/06/2012   AST 15 05/06/2012   ALKPHOS 67 05/06/2012   BILITOT 0.4 05/06/2012      Patient Active Problem List   Diagnosis Date Noted  . Urinary incontinence 06/15/2013  . 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 Dysphagia, unspecified(787.20) 09/15/2012  . Vaginitis and vulvovaginitis 09/08/2012  . Facial rash 09/08/2012  . 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 Abdominal pain 11/15/2011  . Gynecological examination 06/24/2011  . Hypothyroidism 05/30/2011  . Depression, major, recurrent, severe with psychosis 05/30/2011  . MR (mental retardation) 05/30/2011  . Bipolar disorder 05/30/2011  . Sleep apnea 05/30/2011  . Menorrhagia 05/30/2011  .  Seizures    Past Medical History  Diagnosis Date  . Depression   . Thyroid disease   . Seizures     according to echart- seizures vs pseudoseizures  . Anxiety   . Sleep apnea   . Esophagitis     LA Class A  . Hiatal hernia   . Pseudoseizures    Past Surgical History  Procedure Laterality Date  . Inner ear surgery Bilateral 1994    poor historian   History  Substance Use Topics  . Smoking status: Never Smoker   . Smokeless tobacco: Never Used  . Alcohol Use: No   Family History  Problem Relation Age of Onset  . Colon polyps Mother   . Celiac disease Mother   . Diabetes Paternal Grandmother   . Heart disease Paternal Grandfather   . Colon polyps Father   . Colon polyps Paternal Aunt   . Celiac disease Brother   . Diabetes Maternal Aunt   . Heart disease Maternal Grandfather   . Heart disease Maternal Uncle   . Heart disease Maternal Aunt    Allergies  Allergen Reactions  . Penicillins Rash   Current Outpatient Prescriptions on File Prior to Visit  Medication Sig Dispense Refill  . ARIPiprazole (ABILIFY) 15 MG tablet Take 15 mg by mouth daily.      Lenda Kelp FE 1.5/30 1.5-30 MG-MCG tablet TAKE 1 TABLET BY MOUTH DAILY.  28 tablet  5  . lamoTRIgine (LAMICTAL) 150 MG tablet Take 150 mg by mouth  2 (two) times daily.      Marland Kitchen levothyroxine (SYNTHROID, LEVOTHROID) 125 MCG tablet TAKE 2 TABLETS BY MOUTH EVERY DAY  60 tablet  5  . levothyroxine (SYNTHROID, LEVOTHROID) 50 MCG tablet TAKE 1 TABLET BY MOUTH EVERY DAY  30 tablet  5  . liothyronine (CYTOMEL) 25 MCG tablet TAKE 1 TABLET BY MOUTH EVERY DAY  30 tablet  11  . metroNIDAZOLE (METROGEL) 0.75 % gel Apply topically 2 (two) times daily.  45 g  0  . omeprazole (PRILOSEC) 40 MG capsule Take 1 capsule (40 mg total) by mouth daily.  90 capsule  3  . sertraline (ZOLOFT) 100 MG tablet Take one by mouth daily      . terconazole (TERAZOL 3) 0.8 % vaginal cream Place 1 applicator vaginally at bedtime.  20 g  0  . topiramate  (TOPAMAX) 100 MG tablet 1 in the am and 1.5 tabs at night  225 tablet  1   No current facility-administered medications on file prior to visit.   The PMH, PSH, Social History, Family History, Medications, and allergies have been reviewed in [facility], and have been updated if relevant.   Review of Systems See HPI  No nausea, vomiting or fevers    Objective:   Physical Exam BP 128/74  Pulse 73  Temp(Src) 98 F (36.7 C) (Oral)  Ht 5\' 5"  (1.651 m)  Wt 280 lb (127.007 kg)  BMI 46.59 kg/m2  SpO2 99%  LMP 06/02/2013  General:  Well-developed,well-nourished,in no acute distress; alert,appropriate and cooperative throughout examination Head:  normocephalic and atraumatic.   Eyes:  vision grossly intact, pupils equal, pupils round, and pupils reactive to light.   Lungs:  Normal respiratory effort, chest expands symmetrically. Lungs are clear to auscultation, no crackles or wheezes. Heart:  Normal rate and regular rhythm. S1 and S2 normal without gallop, murmur, click, rub or other extra sounds. Abdomen:  Bowel sounds positive,abdomen soft and non-tender without masses, organomegaly or hernias noted. No CVA or suprapubic tenderness Msk:  No deformity or scoliosis noted of thoracic or lumbar spine.   Extremities:  No clubbing, cyanosis, edema, or deformity noted with normal full range of motion of all joints.   Psych:  Good eye contact, conversant but shy Assessment & Plan:

## 2013-06-15 NOTE — Assessment & Plan Note (Signed)
Consistent with early perimenopause. Continue to monitor. Discussed course and symptoms of menopause. Call or return to clinic prn if these symptoms worsen or fail to improve as anticipated. The patient indicates understanding of these issues and agrees with the plan.

## 2013-06-15 NOTE — Assessment & Plan Note (Signed)
Agreed to refill her Abilify and Zoloft but she must keep up with her psychotherapy and return here for routine blood work every 3-6 months. If dosages need to be adjusted, will need to consult psych. The patient indicates understanding of these issues and agrees with the plan.

## 2013-06-15 NOTE — Progress Notes (Signed)
Pre visit review using our clinic review tool, if applicable. No additional management support is needed unless otherwise documented below in the visit note. 

## 2013-06-15 NOTE — Patient Instructions (Addendum)
Good to see you. Please come and see me in 3 months (medicare wellness visit).  Please take cipro as directed- 1 tablet twice daily for 3 days.  Drink plenty of water.

## 2013-06-15 NOTE — Assessment & Plan Note (Signed)
UE pos for large LE and blood. Treat for UTI with cipro 500 mg twice daily x 3 days. Send urine for cx given abx allergies.

## 2013-06-15 NOTE — Assessment & Plan Note (Signed)
On Lamictal and topamax. Followed by Dr. Jannifer Franklin.

## 2013-06-16 LAB — URINE CULTURE: Colony Count: >=100000

## 2013-07-28 ENCOUNTER — Other Ambulatory Visit: Payer: Self-pay | Admitting: Family Medicine

## 2013-09-09 ENCOUNTER — Other Ambulatory Visit: Payer: Self-pay | Admitting: Gastroenterology

## 2013-09-09 NOTE — Telephone Encounter (Signed)
NEEDS OFFICE VISIT FOR ANY FURTHER REFILLS! 

## 2013-09-14 ENCOUNTER — Other Ambulatory Visit: Payer: Self-pay | Admitting: Family Medicine

## 2013-09-14 ENCOUNTER — Other Ambulatory Visit (INDEPENDENT_AMBULATORY_CARE_PROVIDER_SITE_OTHER): Payer: Medicare Other

## 2013-09-14 DIAGNOSIS — R569 Unspecified convulsions: Secondary | ICD-10-CM

## 2013-09-14 DIAGNOSIS — Z5181 Encounter for therapeutic drug level monitoring: Secondary | ICD-10-CM | POA: Diagnosis not present

## 2013-09-14 DIAGNOSIS — E785 Hyperlipidemia, unspecified: Secondary | ICD-10-CM | POA: Diagnosis not present

## 2013-09-14 DIAGNOSIS — E039 Hypothyroidism, unspecified: Secondary | ICD-10-CM | POA: Diagnosis not present

## 2013-09-14 DIAGNOSIS — F319 Bipolar disorder, unspecified: Secondary | ICD-10-CM

## 2013-09-14 DIAGNOSIS — F79 Unspecified intellectual disabilities: Secondary | ICD-10-CM

## 2013-09-14 LAB — COMPREHENSIVE METABOLIC PANEL
ALBUMIN: 3.6 g/dL (ref 3.5–5.2)
ALT: 17 U/L (ref 0–35)
AST: 17 U/L (ref 0–37)
Alkaline Phosphatase: 59 U/L (ref 39–117)
BUN: 9 mg/dL (ref 6–23)
CALCIUM: 8.5 mg/dL (ref 8.4–10.5)
CHLORIDE: 107 meq/L (ref 96–112)
CO2: 23 meq/L (ref 19–32)
Creatinine, Ser: 1 mg/dL (ref 0.4–1.2)
GFR: 62.78 mL/min (ref >60.00)
GLUCOSE: 111 mg/dL — AB (ref 70–99)
Potassium: 3.7 mEq/L (ref 3.5–5.1)
Sodium: 137 mEq/L (ref 135–145)
Total Bilirubin: 0.2 mg/dL (ref 0.2–1.2)
Total Protein: 5.8 g/dL — ABNORMAL LOW (ref 6.0–8.3)

## 2013-09-14 LAB — CBC WITH DIFFERENTIAL/PLATELET
Basophils Absolute: 0 10*3/uL (ref 0.0–0.1)
Basophils Relative: 0.6 % (ref 0.0–3.0)
EOS PCT: 3.8 % (ref 0.0–5.0)
Eosinophils Absolute: 0.2 10*3/uL (ref 0.0–0.7)
HEMATOCRIT: 35.4 % — AB (ref 36.0–46.0)
Hemoglobin: 11.6 g/dL — ABNORMAL LOW (ref 12.0–15.0)
Lymphocytes Relative: 25.1 % (ref 12.0–46.0)
Lymphs Abs: 1.3 10*3/uL (ref 0.7–4.0)
MCHC: 32.7 g/dL (ref 30.0–36.0)
MCV: 95.6 fl (ref 78.0–100.0)
MONOS PCT: 4.3 % (ref 3.0–12.0)
Monocytes Absolute: 0.2 10*3/uL (ref 0.1–1.0)
NEUTROS PCT: 66.2 % (ref 43.0–77.0)
Neutro Abs: 3.6 10*3/uL (ref 1.4–7.7)
PLATELETS: 141 10*3/uL — AB (ref 150.0–400.0)
RBC: 3.7 Mil/uL — ABNORMAL LOW (ref 3.87–5.11)
RDW: 14.2 % (ref 11.5–15.5)
WBC: 5.4 10*3/uL (ref 4.0–10.5)

## 2013-09-14 LAB — LIPID PANEL
CHOL/HDL RATIO: 4
CHOLESTEROL: 189 mg/dL (ref 0–200)
HDL: 45.1 mg/dL (ref >39.00)
LDL Cholesterol: 111 mg/dL — ABNORMAL HIGH (ref 0–99)
NonHDL: 143.9
TRIGLYCERIDES: 165 mg/dL — AB (ref 0.0–149.0)
VLDL: 33 mg/dL (ref 0.0–40.0)

## 2013-09-14 LAB — T4, FREE: FREE T4: 0.46 ng/dL — AB (ref 0.60–1.60)

## 2013-09-14 LAB — TSH: TSH: 13.11 u[IU]/mL — AB (ref 0.35–4.50)

## 2013-09-21 ENCOUNTER — Ambulatory Visit (INDEPENDENT_AMBULATORY_CARE_PROVIDER_SITE_OTHER): Payer: Medicare Other | Admitting: Family Medicine

## 2013-09-21 ENCOUNTER — Encounter: Payer: Self-pay | Admitting: Family Medicine

## 2013-09-21 ENCOUNTER — Encounter (INDEPENDENT_AMBULATORY_CARE_PROVIDER_SITE_OTHER): Payer: Self-pay

## 2013-09-21 VITALS — BP 136/72 | HR 86 | Temp 98.0°F | Ht 65.5 in | Wt 278.5 lb

## 2013-09-21 DIAGNOSIS — R609 Edema, unspecified: Secondary | ICD-10-CM

## 2013-09-21 DIAGNOSIS — R739 Hyperglycemia, unspecified: Secondary | ICD-10-CM

## 2013-09-21 DIAGNOSIS — N921 Excessive and frequent menstruation with irregular cycle: Secondary | ICD-10-CM

## 2013-09-21 DIAGNOSIS — Z1231 Encounter for screening mammogram for malignant neoplasm of breast: Secondary | ICD-10-CM

## 2013-09-21 DIAGNOSIS — R7309 Other abnormal glucose: Secondary | ICD-10-CM

## 2013-09-21 DIAGNOSIS — F333 Major depressive disorder, recurrent, severe with psychotic symptoms: Secondary | ICD-10-CM

## 2013-09-21 DIAGNOSIS — F319 Bipolar disorder, unspecified: Secondary | ICD-10-CM

## 2013-09-21 DIAGNOSIS — N920 Excessive and frequent menstruation with regular cycle: Secondary | ICD-10-CM | POA: Diagnosis not present

## 2013-09-21 DIAGNOSIS — R569 Unspecified convulsions: Secondary | ICD-10-CM | POA: Diagnosis not present

## 2013-09-21 DIAGNOSIS — Z0000 Encounter for general adult medical examination without abnormal findings: Secondary | ICD-10-CM | POA: Insufficient documentation

## 2013-09-21 DIAGNOSIS — E038 Other specified hypothyroidism: Secondary | ICD-10-CM

## 2013-09-21 MED ORDER — LEVOTHYROXINE SODIUM 75 MCG PO TABS: ORAL_TABLET | ORAL | Status: DC

## 2013-09-21 NOTE — Patient Instructions (Addendum)
We are increasing your synthroid (one of them) to 75 mcg daily- please do not take the 50 mcg daily with it- should be a total of 325 mcg of synthroid daily. Come back in 4 weeks to recheck labs.  Please call to set up your mammogram.

## 2013-09-21 NOTE — Assessment & Plan Note (Signed)
Remains consistent with perimenopause. Discussed changing OCPS or starting Depo but pt's mom would like to give her body a little more time to see if her periods will stop on their own. Call or return to clinic prn if these symptoms worsen or fail to improve as anticipated.  The patient indicates understanding of these issues and agrees with the plan.

## 2013-09-21 NOTE — Progress Notes (Signed)
Subjective:    Patient ID: Nancy Hall, female    DOB: 06/19/1970, 43 y.o.   MRN: [id number]  HPI  43 yo female with h/o MR, seizures, depression with psychosis and pseudoseizures, hypothyroidism here with her mom for medicare wellness visit with several concerns.  I have personally reviewed the Medicare Annual Wellness questionnaire and have noted 1. The patient's medical and social history 2. Their use of alcohol, tobacco or illicit drugs 3. Their current medications and supplements 4. The patient's functional ability including ADL's, fall risks, home safety risks and hearing or visual             impairment. 5. Diet and physical activities 6. Evidence for depression or mood disorders  End of life wishes discussed and updated in Social History.  Pap smear 06/24/11- done by me.  No h/o abnormal pap smears. Not sexually active.   No family h/o cervical CA. Has never had a mammogram.   Menorrhagia-  On Junel for regulation of periods.  Last year- skipping periods or very heavy.  Mom and grandma both were menopausal by the time they were 40. + hot flashes. Anemia has deteriorated as well. Lab Results  Component Value Date   WBC 5.4 09/14/2013   HGB 11.6* 09/14/2013   HCT 35.4* 09/14/2013   MCV 95.6 09/14/2013   PLT 141.0* 09/14/2013      Seizure d/o - first grandmal seziure at 5 yo.  Has "emotional seizures/pseudoseizures," had a few more around her father's death in 12-Jan-2013.  Followed by neuro- last saw Evlyn Courier, NP on 05/10/13- note reviewed.  Advised continuing Topamax 100 mg twice daily and considering weaning downLamictal 300 mg daily.  He keeps a check on her lamictal levels. Last grand mal seizure over a year ago.   Bipolar disorder- followed by Pattricia Boss every 2- 3 weeks for psychotherapy, on Abilify 15 mg daily.       Hypothyroidism- TSH elevated, FT4 low at 0.46.  On synthroid 300 mcg daily and Cytomel 25 mcg daily.  She has been more fatigued and gaining  weight. Lab Results  Component Value Date   TSH 13.11* 09/14/2013   Wt Readings from Last 3 Encounters:  09/21/13 278 lb 8 oz (126.327 kg)  06/15/13 280 lb (127.007 kg)  05/10/13 284 lb (128.822 kg)   Hyperglycemia- Fasting glucose 111 on CMET.  Lab Results  Component Value Date   ALT 17 09/14/2013   AST 17 09/14/2013   ALKPHOS 59 09/14/2013   BILITOT 0.2 09/14/2013      Patient Active Problem List   Diagnosis Date Noted  . Medicare annual wellness visit, initial 09/21/2013  . Edema 09/21/2013  . Encounter for therapeutic drug monitoring 09/14/2013  . Dyslipidemia 09/14/2013  . Urinary incontinence 06/15/2013  . 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 Dysphagia, unspecified(787.20) 09/15/2012  . 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 Abdominal pain 11/15/2011  . Gynecological examination 06/24/2011  . Hypothyroidism 05/30/2011  . Depression, major, recurrent, severe with psychosis 05/30/2011  . MR (mental retardation) 05/30/2011  . Bipolar disorder 05/30/2011  . Sleep apnea 05/30/2011  . Menorrhagia 05/30/2011  . Seizures    Past Medical History  Diagnosis Date  . Depression   . Thyroid disease   . Seizures     according to echart- seizures vs pseudoseizures  . Anxiety   . Sleep apnea   . Esophagitis     LA Class A  . Hiatal hernia   . Pseudoseizures  Past Surgical History  Procedure Laterality Date  . Inner ear surgery Bilateral 1994    poor historian   History  Substance Use Topics  . Smoking status: Never Smoker   . Smokeless tobacco: Never Used  . Alcohol Use: No   Family History  Problem Relation Age of Onset  . Colon polyps Mother   . Celiac disease Mother   . Diabetes Paternal Grandmother   . Heart disease Paternal Grandfather   . Colon polyps Father   . Colon polyps Paternal Aunt   . Celiac disease Brother   . Diabetes Maternal Aunt   . Heart disease Maternal Grandfather   . Heart disease Maternal  Uncle   . Heart disease Maternal Aunt    Allergies  Allergen Reactions  . Penicillins Rash   Current Outpatient Prescriptions on File Prior to Visit  Medication Sig Dispense Refill  . ARIPiprazole (ABILIFY) 15 MG tablet Take 15 mg by mouth daily.      . ciprofloxacin (CIPRO) 500 MG tablet Take 1 tablet (500 mg total) by mouth 2 (two) times daily.  6 tablet  0  . lamoTRIgine (LAMICTAL) 150 MG tablet Take 150 mg by mouth 2 (two) times daily.      Marland Kitchen levothyroxine (SYNTHROID, LEVOTHROID) 125 MCG tablet TAKE 2 TABLETS BY MOUTH EVERY DAY  60 tablet  5  . levothyroxine (SYNTHROID, LEVOTHROID) 50 MCG tablet TAKE 1 TABLET BY MOUTH EVERY DAY  30 tablet  5  . liothyronine (CYTOMEL) 25 MCG tablet TAKE 1 TABLET BY MOUTH EVERY DAY  30 tablet  1  . metroNIDAZOLE (METROGEL) 0.75 % gel Apply topically 2 (two) times daily.  45 g  0  . norethindrone-ethinyl estradiol-iron (JUNEL FE 1.5/30) 1.5-30 MG-MCG tablet TAKE 1 TABLET BY MOUTH DAILY.  28 tablet  5  . omeprazole (PRILOSEC) 40 MG capsule TAKE 1 CAPSULE (40 MG TOTAL) BY MOUTH DAILY.  90 capsule  0  . sertraline (ZOLOFT) 100 MG tablet Take one by mouth daily      . terconazole (TERAZOL 3) 0.8 % vaginal cream Place 1 applicator vaginally at bedtime.  20 g  0  . topiramate (TOPAMAX) 100 MG tablet 1 in the am and 1.5 tabs at night  225 tablet  1   No current facility-administered medications on file prior to visit.   The PMH, PSH, Social History, Family History, Medications, and allergies have been reviewed in [facility], and have been updated if relevant.   Review of Systems See HPI  No nausea, vomiting or fevers +  LE edema No CP Mild DOE No dysuria No increased thirst or urination    Objective:   Physical Exam BP 136/72  Pulse 86  Temp(Src) 98 F (36.7 C) (Oral)  Ht 5' 5.5" (1.664 m)  Wt 278 lb 8 oz (126.327 kg)  BMI 45.62 kg/m2  SpO2 99%  LMP 09/07/2013  General:  Well-developed,well-nourished,in no acute distress; alert,appropriate and  cooperative throughout examination Head:  normocephalic and atraumatic.   Mild thyromegaly, no nodules or TTP Eyes:  vision grossly intact, pupils equal, pupils round, and pupils reactive to light.   Lungs:  Normal respiratory effort, chest expands symmetrically. Lungs are clear to auscultation, no crackles or wheezes. Heart:  Normal rate and regular rhythm. S1 and S2 normal without gallop, murmur, click, rub or other extra sounds. Abdomen:  Bowel sounds positive,abdomen soft and non-tender without masses, organomegaly or hernias noted. Msk:  No deformity or scoliosis noted of thoracic or lumbar spine.  Extremities:  Trace edema bilaterally  No clubbing, cyanosis, e or deformity noted with normal full range of motion of all joints.   Psych:  Good eye contact, conversant but shy Assessment & Plan:

## 2013-09-21 NOTE — Assessment & Plan Note (Signed)
Likely multifactorial- body habitus and heat but hypothyroidism may be playing a roll.  Reassess in one month.

## 2013-09-21 NOTE — Assessment & Plan Note (Signed)
Check a1c at one month follow up. Mom is working on changing her diet.

## 2013-09-21 NOTE — Progress Notes (Signed)
Pre visit review using our clinic review tool, if applicable. No additional management support is needed unless otherwise documented below in the visit note. 

## 2013-09-21 NOTE — Assessment & Plan Note (Signed)
Followed by psych. No changes.

## 2013-09-21 NOTE — Assessment & Plan Note (Signed)
The patients weight, height, BMI and visual acuity have been recorded in the chart I have made referrals, counseling and provided education to the patient based review of the above and I have provided the pt with a written personalized care plan for preventive services. Mammogram ordered.

## 2013-09-21 NOTE — Assessment & Plan Note (Signed)
Followed by neuro 

## 2013-09-21 NOTE — Assessment & Plan Note (Signed)
Deteriorated. Increase dose of synthroid. Follow up in 1 month- check thyroid panel, including T3 at that time.

## 2013-09-27 ENCOUNTER — Other Ambulatory Visit: Payer: Self-pay | Admitting: Family Medicine

## 2013-10-04 ENCOUNTER — Other Ambulatory Visit: Payer: Self-pay | Admitting: Nurse Practitioner

## 2013-10-27 ENCOUNTER — Ambulatory Visit (INDEPENDENT_AMBULATORY_CARE_PROVIDER_SITE_OTHER): Payer: Medicare Other | Admitting: Family Medicine

## 2013-10-27 ENCOUNTER — Encounter: Payer: Self-pay | Admitting: Family Medicine

## 2013-10-27 VITALS — BP 132/78 | HR 65 | Temp 97.7°F | Wt 272.2 lb

## 2013-10-27 DIAGNOSIS — N920 Excessive and frequent menstruation with regular cycle: Secondary | ICD-10-CM | POA: Diagnosis not present

## 2013-10-27 DIAGNOSIS — R7309 Other abnormal glucose: Secondary | ICD-10-CM

## 2013-10-27 DIAGNOSIS — E039 Hypothyroidism, unspecified: Secondary | ICD-10-CM | POA: Diagnosis not present

## 2013-10-27 DIAGNOSIS — R739 Hyperglycemia, unspecified: Secondary | ICD-10-CM

## 2013-10-27 DIAGNOSIS — N921 Excessive and frequent menstruation with irregular cycle: Secondary | ICD-10-CM

## 2013-10-27 LAB — TSH: TSH: 1.11 u[IU]/mL (ref 0.35–4.50)

## 2013-10-27 LAB — HEMOGLOBIN A1C: HEMOGLOBIN A1C: 5.5 % (ref 4.6–6.5)

## 2013-10-27 LAB — T4, FREE: Free T4: 0.51 ng/dL — ABNORMAL LOW (ref 0.60–1.60)

## 2013-10-27 LAB — T3, FREE: T3 FREE: 3 pg/mL (ref 2.3–4.2)

## 2013-10-27 NOTE — Assessment & Plan Note (Signed)
Check a1c today

## 2013-10-27 NOTE — Assessment & Plan Note (Signed)
No symptoms or signs of overcorrection. Check thyroid panel today. Orders Placed This Encounter  Procedures  . Hemoglobin A1c  . TSH  . T4, Free  . T3, Free

## 2013-10-27 NOTE — Progress Notes (Signed)
Pre visit review using our clinic review tool, if applicable. No additional management support is needed unless otherwise documented below in the visit note. 

## 2013-10-27 NOTE — Patient Instructions (Signed)
Great to see you. I will call you with your lab results.   

## 2013-10-27 NOTE — Progress Notes (Signed)
Subjective:    Patient ID: Nancy Hall, female    DOB: 07-11-1970, 43 y.o.   MRN: [id number]  HPI  43 yo female with h/o MR, seizures, depression with psychosis and pseudoseizures, hypothyroidism here with her mom for one month follow up.  Saw her for medicare wellness visit last month.  Menorrhagia-  On Junel for regulation of periods.  Last year- skipping periods or very heavy.  Mom and grandma both were menopausal by the time they were 40. + hot flashes. Anemia has deteriorated as well.  Discussed changing OCPs or starting IM Depo but mom wanted to give it a little more time to see if she was entering menopause and perhaps her periods would stop or lighten on their own. Lab Results  Component Value Date   WBC 5.4 09/14/2013   HGB 11.6* 09/14/2013   HCT 35.4* 09/14/2013   MCV 95.6 09/14/2013   PLT 141.0* 09/14/2013      Hypothyroidism- TSH elevated, FT4 low at 0.46 last month.  We increased the dose of her synthroid to 325 mcg daily and continued her dose of Cytomel 25 mcg daily.  Energy improved- walking more.  Has lost 5 pounds.   Has noticed no worsening of her occasional tremor, insomnia, anxiety or diarrhea. Lab Results  Component Value Date   TSH 13.11* 09/14/2013   Wt Readings from Last 3 Encounters:  10/27/13 272 lb 4 oz (123.492 kg)  09/21/13 278 lb 8 oz (126.327 kg)  06/15/13 280 lb (127.007 kg)   Hyperglycemia- Fasting glucose 111 on CMET last month.  Denies any increased thirst or urination.  Lab Results  Component Value Date   ALT 17 09/14/2013   AST 17 09/14/2013   ALKPHOS 59 09/14/2013   BILITOT 0.2 09/14/2013      Patient Active Problem List   Diagnosis Date Noted  . Medicare annual wellness visit, initial 09/21/2013  . Edema 09/21/2013  . Hyperglycemia 09/21/2013  . Encounter for therapeutic drug monitoring 09/14/2013  . Dyslipidemia 09/14/2013  . Urinary incontinence 06/15/2013  . 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 Dysphagia, unspecified(787.20) 09/15/2012   . 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 Abdominal pain 11/15/2011  . Gynecological examination 06/24/2011  . Hypothyroidism 05/30/2011  . Depression, major, recurrent, severe with psychosis 05/30/2011  . MR (mental retardation) 05/30/2011  . Bipolar disorder 05/30/2011  . Sleep apnea 05/30/2011  . Menorrhagia 05/30/2011  . Seizures    Past Medical History  Diagnosis Date  . Depression   . Thyroid disease   . Seizures     according to echart- seizures vs pseudoseizures  . Anxiety   . Sleep apnea   . Esophagitis     LA Class A  . Hiatal hernia   . Pseudoseizures    Past Surgical History  Procedure Laterality Date  . Inner ear surgery Bilateral 1994    poor historian   History  Substance Use Topics  . Smoking status: Never Smoker   . Smokeless tobacco: Never Used  . Alcohol Use: No   Family History  Problem Relation Age of Onset  . Colon polyps Mother   . Celiac disease Mother   . Diabetes Paternal Grandmother   . Heart disease Paternal Grandfather   . Colon polyps Father   . Colon polyps Paternal Aunt   . Celiac disease Brother   . Diabetes Maternal Aunt   . Heart disease Maternal Grandfather   . Heart disease Maternal Uncle   . Heart  disease Maternal Aunt    Allergies  Allergen Reactions  . Penicillins Rash   Current Outpatient Prescriptions on File Prior to Visit  Medication Sig Dispense Refill  . ARIPiprazole (ABILIFY) 15 MG tablet Take 15 mg by mouth daily.      Marland Kitchen lamoTRIgine (LAMICTAL) 150 MG tablet Take 150 mg by mouth 2 (two) times daily.      Marland Kitchen levothyroxine (SYNTHROID, LEVOTHROID) 125 MCG tablet TAKE 2 TABLETS BY MOUTH EVERY DAY  60 tablet  5  . levothyroxine (SYNTHROID, LEVOTHROID) 75 MCG tablet TAKE 1 TABLET BY MOUTH EVERY DAY  30 tablet  5  . liothyronine (CYTOMEL) 25 MCG tablet TAKE 1 TABLET BY MOUTH EVERY DAY  30 tablet  1  . metroNIDAZOLE (METROGEL) 0.75 % gel Apply topically 2 (two) times  daily.  45 g  0  . norethindrone-ethinyl estradiol-iron (JUNEL FE 1.5/30) 1.5-30 MG-MCG tablet TAKE 1 TABLET BY MOUTH DAILY.  28 tablet  5  . omeprazole (PRILOSEC) 40 MG capsule TAKE 1 CAPSULE (40 MG TOTAL) BY MOUTH DAILY.  90 capsule  0  . sertraline (ZOLOFT) 100 MG tablet Take one by mouth daily      . terconazole (TERAZOL 3) 0.8 % vaginal cream Place 1 applicator vaginally at bedtime.  20 g  0  . topiramate (TOPAMAX) 100 MG tablet TAKE 1 TABLET IN THE MORNING AND 1 & 1/2 TABLETS AT NIGHT  225 tablet  1   No current facility-administered medications on file prior to visit.   The PMH, PSH, Social History, Family History, Medications, and allergies have been reviewed in [facility], and have been updated if relevant.   Review of Systems See HPI   No CP Mild DOE No dysuria No increased thirst or urination    Objective:   Physical Exam BP 132/78  Pulse 65  Temp(Src) 97.7 F (36.5 C) (Oral)  Wt 272 lb 4 oz (123.492 kg)  SpO2 97%  LMP 09/27/2013  General:  Well-developed,well-nourished,in no acute distress; alert,appropriate and cooperative throughout examination Head:  normocephalic and atraumatic.   Mild thyromegaly, no nodules or TTP Eyes:  vision grossly intact, pupils equal, pupils round, and pupils reactive to light.   Lungs:  Normal respiratory effort, chest expands symmetrically. Lungs are clear to auscultation, no crackles or wheezes. Heart:  Normal rate and regular rhythm. S1 and S2 normal without gallop, murmur, click, rub or other extra sounds. Abdomen:  Bowel sounds positive,abdomen soft and non-tender without masses, organomegaly or hernias noted. Msk:  No deformity or scoliosis noted of thoracic or lumbar spine.   Extremities:  Trace edema bilaterally  No clubbing, cyanosis, e or deformity noted with normal full range of motion of all joints.   Psych:  Good eye contact, conversant but shy Assessment & Plan:

## 2013-11-08 ENCOUNTER — Ambulatory Visit: Payer: Medicare Other | Admitting: Neurology

## 2013-11-22 ENCOUNTER — Other Ambulatory Visit: Payer: Self-pay | Admitting: Family Medicine

## 2013-11-24 ENCOUNTER — Other Ambulatory Visit: Payer: Self-pay | Admitting: Family Medicine

## 2013-12-07 ENCOUNTER — Ambulatory Visit
Admission: RE | Admit: 2013-12-07 | Discharge: 2013-12-07 | Disposition: A | Discharge status: Home / Self Care | Payer: Medicare Other | Source: Ambulatory Visit | Attending: Family Medicine | Admitting: Family Medicine

## 2013-12-07 DIAGNOSIS — Z1231 Encounter for screening mammogram for malignant neoplasm of breast: Secondary | ICD-10-CM

## 2013-12-24 ENCOUNTER — Other Ambulatory Visit: Payer: Self-pay

## 2013-12-24 MED ORDER — OMEPRAZOLE 40 MG PO CPDR: DELAYED_RELEASE_CAPSULE | ORAL | Status: DC

## 2013-12-24 MED ORDER — SERTRALINE HCL 100 MG PO TABS: ORAL_TABLET | ORAL | Status: DC

## 2013-12-24 MED ORDER — LAMOTRIGINE 150 MG PO TABS: 150.0000 mg | ORAL_TABLET | Freq: Two times a day (BID) | ORAL | Status: DC

## 2013-12-24 NOTE — Telephone Encounter (Signed)
pts mother left v/m requesting refill on lamotrigine,zoloft and omeprazole to [place]. Dr Deborra Medina has not prescribed before.Please advise.

## 2014-01-06 DIAGNOSIS — H903 Sensorineural hearing loss, bilateral: Secondary | ICD-10-CM | POA: Diagnosis not present

## 2014-01-06 DIAGNOSIS — H60399 Other infective otitis externa, unspecified ear: Secondary | ICD-10-CM | POA: Diagnosis not present

## 2014-01-06 DIAGNOSIS — H6983 Other specified disorders of Eustachian tube, bilateral: Secondary | ICD-10-CM | POA: Diagnosis not present

## 2014-01-25 ENCOUNTER — Other Ambulatory Visit: Payer: Self-pay | Admitting: *Deleted

## 2014-01-25 MED ORDER — LAMOTRIGINE 150 MG PO TABS: 150.0000 mg | ORAL_TABLET | Freq: Two times a day (BID) | ORAL | Status: DC

## 2014-02-01 ENCOUNTER — Ambulatory Visit: Payer: Medicare Other | Admitting: Neurology

## 2014-02-23 ENCOUNTER — Other Ambulatory Visit: Payer: Self-pay | Admitting: *Deleted

## 2014-02-23 MED ORDER — SERTRALINE HCL 100 MG PO TABS: ORAL_TABLET | ORAL | Status: DC

## 2014-03-02 ENCOUNTER — Telehealth: Payer: Self-pay

## 2014-03-02 NOTE — Telephone Encounter (Signed)
Nancy Hall with [facility] left v/m requesting refill aripiprazole. Pt last seen 10/27/13.

## 2014-03-02 NOTE — Telephone Encounter (Signed)
Lm on pts vm requesting a call back 

## 2014-03-02 NOTE — Telephone Encounter (Signed)
Dr. Deborra Medina note from 3/15 reviewed- requested lab work every 3-6 months. She will need CMET done before Abilify can be reordered. Please order future CMET and have pt come in for lab appt

## 2014-03-03 NOTE — Telephone Encounter (Signed)
Spoke to pt and sched lab appt; Rx to be filled after results received

## 2014-03-03 NOTE — Telephone Encounter (Signed)
Patient's mother returned your call.  Please call patient back at [phone].

## 2014-03-04 ENCOUNTER — Other Ambulatory Visit (INDEPENDENT_AMBULATORY_CARE_PROVIDER_SITE_OTHER): Payer: Medicare Other

## 2014-03-04 DIAGNOSIS — Z79899 Other long term (current) drug therapy: Secondary | ICD-10-CM

## 2014-03-06 LAB — COMPREHENSIVE METABOLIC PANEL
ALK PHOS: 58 U/L (ref 39–117)
ALT: 15 U/L (ref 0–35)
AST: 13 U/L (ref 0–37)
Albumin: 3.7 g/dL (ref 3.5–5.2)
BUN: 12 mg/dL (ref 6–23)
CO2: 23 mEq/L (ref 19–32)
CREATININE: 1 mg/dL (ref 0.4–1.2)
Calcium: 8.1 mg/dL — ABNORMAL LOW (ref 8.4–10.5)
Chloride: 109 mEq/L (ref 96–112)
GFR: 63.35 mL/min (ref >60.00)
Glucose, Bld: 102 mg/dL — ABNORMAL HIGH (ref 70–99)
Potassium: 4.1 mEq/L (ref 3.5–5.1)
Sodium: 139 mEq/L (ref 135–145)
Total Bilirubin: 0.4 mg/dL (ref 0.2–1.2)
Total Protein: 6.3 g/dL (ref 6.0–8.3)

## 2014-03-08 MED ORDER — ARIPIPRAZOLE 15 MG PO TABS: 15.0000 mg | ORAL_TABLET | Freq: Every day | ORAL | Status: DC

## 2014-03-09 ENCOUNTER — Encounter: Payer: Self-pay | Admitting: *Deleted

## 2014-03-16 ENCOUNTER — Ambulatory Visit: Payer: Medicare Other | Admitting: Internal Medicine

## 2014-03-16 ENCOUNTER — Encounter: Payer: Self-pay | Admitting: Family Medicine

## 2014-03-16 ENCOUNTER — Ambulatory Visit (INDEPENDENT_AMBULATORY_CARE_PROVIDER_SITE_OTHER): Payer: Medicare Other | Admitting: Family Medicine

## 2014-03-16 VITALS — BP 114/72 | HR 79 | Temp 97.7°F | Wt 276.0 lb

## 2014-03-16 DIAGNOSIS — J069 Acute upper respiratory infection, unspecified: Secondary | ICD-10-CM | POA: Diagnosis not present

## 2014-03-16 MED ORDER — HYDROCODONE-HOMATROPINE 5-1.5 MG/5ML PO SYRP: 5.0000 mL | ORAL_SOLUTION | Freq: Three times a day (TID) | ORAL | Status: DC | PRN

## 2014-03-16 NOTE — Progress Notes (Signed)
SUBJECTIVE:  Nancy Hall is a 43 y.o. female who complains of coryza, congestion and dry cough for 4 days. She denies a history of anorexia, chest pain, myalgias, nausea, vomiting and weakness and denies a history of asthma. Patient denies smoke cigarettes.   Current Outpatient Prescriptions on File Prior to Visit  Medication Sig Dispense Refill  . ARIPiprazole (ABILIFY) 15 MG tablet Take 1 tablet (15 mg total) by mouth daily. 30 tablet 0  . JUNEL FE 1.5/30 1.5-30 MG-MCG tablet TAKE 1 TABLET BY MOUTH EVERY DAY 28 tablet 10  . lamoTRIgine (LAMICTAL) 150 MG tablet Take 1 tablet (150 mg total) by mouth 2 (two) times daily. 60 tablet 2  . levothyroxine (SYNTHROID, LEVOTHROID) 125 MCG tablet TAKE 2 TABLETS BY MOUTH EVERY DAY 60 tablet 5  . levothyroxine (SYNTHROID, LEVOTHROID) 75 MCG tablet TAKE 1 TABLET BY MOUTH EVERY DAY 30 tablet 5  . liothyronine (CYTOMEL) 25 MCG tablet TAKE 1 TABLET BY MOUTH EVERY DAY 30 tablet 3  . metroNIDAZOLE (METROGEL) 0.75 % gel Apply topically 2 (two) times daily. 45 g 0  . omeprazole (PRILOSEC) 40 MG capsule TAKE 1 CAPSULE (40 MG TOTAL) BY MOUTH DAILY. 90 capsule 0  . sertraline (ZOLOFT) 100 MG tablet Take one by mouth daily 30 tablet 2  . terconazole (TERAZOL 3) 0.8 % vaginal cream Place 1 applicator vaginally at bedtime. 20 g 0  . topiramate (TOPAMAX) 100 MG tablet TAKE 1 TABLET IN THE MORNING AND 1 & 1/2 TABLETS AT NIGHT 225 tablet 1   No current facility-administered medications on file prior to visit.    Allergies  Allergen Reactions  . Penicillins Rash    Past Medical History  Diagnosis Date  . Depression   . Thyroid disease   . Seizures     according to echart- seizures vs pseudoseizures  . Anxiety   . Sleep apnea   . Esophagitis     LA Class A  . Hiatal hernia   . Pseudoseizures     Past Surgical History  Procedure Laterality Date  . Inner ear surgery Bilateral 1994    poor historian    Family History  Problem Relation Age of Onset   . Colon polyps Mother   . Celiac disease Mother   . Diabetes Paternal Grandmother   . Heart disease Paternal Grandfather   . Colon polyps Father   . Colon polyps Paternal Aunt   . Celiac disease Brother   . Diabetes Maternal Aunt   . Heart disease Maternal Grandfather   . Heart disease Maternal Uncle   . Heart disease Maternal Aunt     History   Social History  . Marital Status: Single    Spouse Name: N/A    Number of Children: 0  . Years of Education: 10   Occupational History  . Not on file.   Social History Main Topics  . Smoking status: Never Smoker   . Smokeless tobacco: Never Used  . Alcohol Use: No  . Drug Use: No  . Sexual Activity: Not on file   Other Topics Concern  . Not on file   Social History Narrative   Patient lives at home with mom.    Patient does not have any children.    Patient has a 10th grade education.    Patient is single.    Patient is left handed.    Does not have a living will or HPOA- full code.    The PMH, PSH, Social History,  Family History, Medications, and allergies have been reviewed in [facility], and have been updated if relevant. OBJECTIVE: BP 114/72 mmHg  Pulse 79  Temp(Src) 97.7 F (36.5 C) (Oral)  Wt 276 lb (125.193 kg)  SpO2 97%  LMP 03/05/2014  She appears well, vital signs are as noted. Ears normal.  Throat and pharynx normal.  Neck supple. No adenopathy in the neck. Nose is congested. Sinuses non tender. The chest is clear, without wheezes or rales.  ASSESSMENT:  viral upper respiratory illness  PLAN: Symptomatic therapy suggested: push fluids, rest and return office visit prn if symptoms persist or worsen. Lack of antibiotic effectiveness discussed with her. Call or return to clinic prn if these symptoms worsen or fail to improve as anticipated.

## 2014-03-16 NOTE — Patient Instructions (Signed)
Great to see you. This is likely a virus. Please keep drinking plenty of fluids.  Try hycodan as needed for cough at bedtime.  Call me before the weekend with an update.

## 2014-03-16 NOTE — Progress Notes (Signed)
Pre visit review using our clinic review tool, if applicable. No additional management support is needed unless otherwise documented below in the visit note. 

## 2014-03-18 ENCOUNTER — Other Ambulatory Visit: Payer: Self-pay

## 2014-03-18 MED ORDER — LIOTHYRONINE SODIUM 25 MCG PO TABS: 25.0000 ug | ORAL_TABLET | Freq: Every day | ORAL | Status: DC

## 2014-03-18 NOTE — Telephone Encounter (Signed)
[facility] left v/m requesting cb about refill request for liothyronine. Pt last seen sick visit12/16/15, ;as thyroid visit 10/27/13 and no future appt scheduled.Please advise.

## 2014-03-30 ENCOUNTER — Other Ambulatory Visit: Payer: Self-pay | Admitting: Nurse Practitioner

## 2014-04-01 DIAGNOSIS — D649 Anemia, unspecified: Secondary | ICD-10-CM

## 2014-04-01 HISTORY — DX: Anemia, unspecified: D64.9

## 2014-04-05 ENCOUNTER — Encounter: Payer: Self-pay | Admitting: Neurology

## 2014-04-05 ENCOUNTER — Other Ambulatory Visit: Payer: Self-pay | Admitting: Internal Medicine

## 2014-04-05 ENCOUNTER — Ambulatory Visit (INDEPENDENT_AMBULATORY_CARE_PROVIDER_SITE_OTHER): Payer: Medicare Other | Admitting: Neurology

## 2014-04-05 VITALS — BP 110/78 | Ht 66.0 in | Wt 273.2 lb

## 2014-04-05 DIAGNOSIS — G4710 Hypersomnia, unspecified: Secondary | ICD-10-CM

## 2014-04-05 DIAGNOSIS — G4730 Sleep apnea, unspecified: Secondary | ICD-10-CM | POA: Diagnosis not present

## 2014-04-05 DIAGNOSIS — F445 Conversion disorder with seizures or convulsions: Secondary | ICD-10-CM | POA: Diagnosis not present

## 2014-04-05 DIAGNOSIS — R569 Unspecified convulsions: Secondary | ICD-10-CM | POA: Diagnosis not present

## 2014-04-05 NOTE — Progress Notes (Signed)
Reason for visit: Seizures  Nancy Hall is an 44 y.o. female  History of present illness:  Ms. Winkles is a 44 year old left-handed white female with a history of morbid obesity and a history of seizures. She has significant psychiatric disease, and she has had well-documented pseudoseizures. The patient has done quite well with her seizures and pseudoseizures since last seen, without any recurrence since February 2015. The patient does not operate a motor vehicle, and she never has. She lives at home with her mother, and she does not engage in any gainful employment. She has not had any new medical issues since last seen, but she has had some increasing problems with fatigue and excessive daytime drowsiness. The patient snores quite a bit at night, she indicates that she underwent a sleep study several years ago, she was told that she had mild sleep apnea at that time. Her symptoms have significant worsened over time, however.  Past Medical History  Diagnosis Date  . Depression   . Thyroid disease   . Seizures     according to echart- seizures vs pseudoseizures  . Anxiety   . Sleep apnea   . Esophagitis     LA Class A  . Hiatal hernia   . Pseudoseizures   . Obesity     Past Surgical History  Procedure Laterality Date  . Inner ear surgery Bilateral 1994    poor historian    Family History  Problem Relation Age of Onset  . Colon polyps Mother   . Celiac disease Mother   . Diabetes Paternal Grandmother   . Heart disease Paternal Grandfather   . Colon polyps Father   . Colon polyps Paternal Aunt   . Celiac disease Brother   . Diabetes Maternal Aunt   . Heart disease Maternal Grandfather   . Heart disease Maternal Uncle   . Heart disease Maternal Aunt   . Diabetes Sister   . Seizures Neg Hx     Social history:  reports that she has never smoked. She has never used smokeless tobacco. She reports that she does not drink alcohol or use illicit drugs.    Allergies    Allergen Reactions  . Penicillins Rash    Medications:  Current Outpatient Prescriptions on File Prior to Visit  Medication Sig Dispense Refill  . ARIPiprazole (ABILIFY) 15 MG tablet Take 1 tablet (15 mg total) by mouth daily. 30 tablet 0  . JUNEL FE 1.5/30 1.5-30 MG-MCG tablet TAKE 1 TABLET BY MOUTH EVERY DAY 28 tablet 10  . lamoTRIgine (LAMICTAL) 150 MG tablet Take 1 tablet (150 mg total) by mouth 2 (two) times daily. 60 tablet 2  . levothyroxine (SYNTHROID, LEVOTHROID) 125 MCG tablet TAKE 2 TABLETS BY MOUTH EVERY DAY 60 tablet 5  . levothyroxine (SYNTHROID, LEVOTHROID) 75 MCG tablet TAKE 1 TABLET BY MOUTH EVERY DAY 30 tablet 5  . liothyronine (CYTOMEL) 25 MCG tablet Take 1 tablet (25 mcg total) by mouth daily. 30 tablet 3  . omeprazole (PRILOSEC) 40 MG capsule TAKE 1 CAPSULE (40 MG TOTAL) BY MOUTH DAILY. 90 capsule 0  . sertraline (ZOLOFT) 100 MG tablet Take one by mouth daily 30 tablet 2  . topiramate (TOPAMAX) 100 MG tablet TAKE 1 TABLET IN THE MORNING AND 1 & 1/2 TABLETS AT NIGHT 225 tablet 0   No current facility-administered medications on file prior to visit.    ROS:  Out of a complete 14 system review of symptoms, the patient complains only  of the following symptoms, and all other reviewed systems are negative.  Fatigue  Blood pressure 110/78, height 5\' 6"  (1.676 m), weight 273 lb 3.2 oz (123.923 kg), last menstrual period 03/05/2014.  Physical Exam  General: The patient is alert and cooperative at the time of the examination. The patient is morbidly obese.  Skin: No significant peripheral edema is noted.   Neurologic Exam  Mental status: The patient is oriented x 3.  Cranial nerves: Facial symmetry is present. Speech is normal, no aphasia or dysarthria is noted. Extraocular movements are full. Visual fields are full.  Motor: The patient has good strength in all 4 extremities.  Sensory examination: Soft touch sensation is symmetric on the face, arms, and  legs.  Coordination: The patient has good finger-nose-finger and heel-to-shin bilaterally.  Gait and station: The patient has a normal gait. Tandem gait is normal. Romberg is negative. No drift is seen.  Reflexes: Deep tendon reflexes are symmetric.   Assessment/Plan:  1. Seizures  2. Pseudoseizures  3. Anxiety and depression  4. Excessive daytime drowsiness  5. Morbid obesity  The patient is having increasing problems with fatigue and daytime drowsiness. She will be sent back for another sleep study. She has gained weight, and had increased symptoms of drowsiness and fatigue. The patient is doing well with her seizures/pseudoseizures. She will be continued on the Topamax, and she will follow-up in one year or as needed. She does not operate a motor vehicle.  Jill Alexanders MD 04/05/2014 7:22 PM  [facility] [address] [place], [place] [address]  Phone [phone] Fax [phone]

## 2014-04-05 NOTE — Patient Instructions (Signed)

## 2014-04-06 NOTE — Telephone Encounter (Signed)
Last filled by Regina Baity 03/08/14--please advise

## 2014-04-12 ENCOUNTER — Telehealth: Payer: Self-pay | Admitting: Neurology

## 2014-04-12 DIAGNOSIS — G4710 Hypersomnia, unspecified: Secondary | ICD-10-CM

## 2014-04-12 DIAGNOSIS — R569 Unspecified convulsions: Secondary | ICD-10-CM

## 2014-04-12 DIAGNOSIS — F445 Conversion disorder with seizures or convulsions: Secondary | ICD-10-CM

## 2014-04-12 NOTE — Telephone Encounter (Signed)
Dr. Margette Fast, refers patient for attended sleep study.  Height: 5'6"  Weight: 273 lb 3.2 oz   BMI: 44.12  Past Medical History:  Depression    . Thyroid disease   . Seizures     according to echart- seizures vs pseudoseizures  . Anxiety   . Sleep apnea   . Esophagitis     LA Class A  . Hiatal hernia   . Pseudoseizures   . Obesity     Sleep Symptoms: fatigue and excessive daytime drowsiness. The patient snores quite a bit at night, she indicates that she underwent a sleep study several years ago, she was told that she had mild sleep apnea at that time. Her symptoms have significant worsened over time, however.   Epworth Score:   Unknown   Medication:  ARIPiprazole (Tab) ABILIFY 15 MG TAKE 1 TABLET (15 MG TOTAL) BY MOUTH DAILY.       LamoTRIgine (Tab) LAMICTAL 150 MG Take 1 tablet (150 mg total) by mouth 2 (two) times daily.      Levothyroxine Sodium (Tab) SYNTHROID, LEVOTHROID 125 MCG TAKE 2 TABLETS BY MOUTH EVERY DAY      Levothyroxine Sodium (Tab) SYNTHROID, LEVOTHROID 75 MCG TAKE 1 TABLET BY MOUTH EVERY DAY      Liothyronine Sodium (Tab) CYTOMEL 25 MCG Take 1 tablet (25 mcg total) by mouth daily.      Norethin Ace-Eth Estrad-FE (Tab) JUNEL FE 1.5/30 1.5-30 MG-MCG TAKE 1 TABLET BY MOUTH EVERY DAY      Omeprazole (Capsule Delayed Release) PRILOSEC 40 MG TAKE 1 CAPSULE (40 MG TOTAL) BY MOUTH DAILY.      Sertraline HCl (Tab) ZOLOFT 100 MG Take one by mouth daily      Topiramate (Tab) TOPAMAX 100 MG TAKE 1 TABLET IN THE MORNING AND 1 & 1/2 TABLETS AT NIGHT         Ins: Medicare   Assessment & Plan:   1. Seizures  2. Pseudoseizures  3. Anxiety and depression  4. Excessive daytime drowsiness  5. Morbid obesity  The patient is having increasing problems with fatigue and daytime drowsiness. She will be sent back for another sleep study. She has gained weight, and had increased symptoms of drowsiness and  fatigue. The patient is doing well with her seizures/pseudoseizures. She will be continued on the Topamax, and she will follow-up in one year or as needed. She does not operate a motor vehicle.   Please review patient information and submit instructions for scheduling and orders for sleep technologist. Thank you.

## 2014-04-12 NOTE — Telephone Encounter (Signed)
Dr. Jannifer Franklin,  Patient will need a seizure or parasomnia montage ( both work well) and SPLIT at AHI 15 with 4% score ( disability , presumed medicare) . Co2 preferred.   Epworth pending. ReportedEDS, fatigue and headaches. BMI 45 Weight gain since last tested for sleep apnea.

## 2014-04-26 ENCOUNTER — Other Ambulatory Visit: Payer: Self-pay | Admitting: Family Medicine

## 2014-05-18 ENCOUNTER — Other Ambulatory Visit: Payer: Self-pay | Admitting: Family Medicine

## 2014-06-10 ENCOUNTER — Other Ambulatory Visit: Payer: Self-pay | Admitting: Family Medicine

## 2014-07-10 ENCOUNTER — Other Ambulatory Visit: Payer: Self-pay | Admitting: Neurology

## 2014-07-23 ENCOUNTER — Other Ambulatory Visit: Payer: Self-pay | Admitting: Family Medicine

## 2014-07-25 ENCOUNTER — Other Ambulatory Visit: Payer: Self-pay | Admitting: Family Medicine

## 2014-08-13 ENCOUNTER — Other Ambulatory Visit: Payer: Self-pay | Admitting: Family Medicine

## 2014-08-22 ENCOUNTER — Other Ambulatory Visit: Payer: Self-pay | Admitting: Family Medicine

## 2014-08-28 ENCOUNTER — Other Ambulatory Visit: Payer: Self-pay | Admitting: Family Medicine

## 2014-09-15 ENCOUNTER — Other Ambulatory Visit: Payer: Self-pay | Admitting: Family Medicine

## 2014-09-24 ENCOUNTER — Other Ambulatory Visit: Payer: Self-pay | Admitting: Family Medicine

## 2014-09-28 ENCOUNTER — Ambulatory Visit (INDEPENDENT_AMBULATORY_CARE_PROVIDER_SITE_OTHER): Payer: Medicare Other | Admitting: Family Medicine

## 2014-09-28 ENCOUNTER — Encounter: Payer: Self-pay | Admitting: Family Medicine

## 2014-09-28 VITALS — BP 124/72 | HR 88 | Temp 98.1°F | Wt 273.0 lb

## 2014-09-28 DIAGNOSIS — N921 Excessive and frequent menstruation with irregular cycle: Secondary | ICD-10-CM

## 2014-09-28 DIAGNOSIS — R21 Rash and other nonspecific skin eruption: Secondary | ICD-10-CM

## 2014-09-28 LAB — HEPATIC FUNCTION PANEL
ALBUMIN: 4 g/dL (ref 3.5–5.2)
ALT: 13 U/L (ref 0–35)
AST: 13 U/L (ref 0–37)
Alkaline Phosphatase: 65 U/L (ref 39–117)
Bilirubin, Direct: 0.1 mg/dL (ref 0.0–0.3)
Total Bilirubin: 0.4 mg/dL (ref 0.2–1.2)
Total Protein: 6.8 g/dL (ref 6.0–8.3)

## 2014-09-28 MED ORDER — TERBINAFINE HCL 250 MG PO TABS: 250.0000 mg | ORAL_TABLET | Freq: Every day | ORAL | Status: AC

## 2014-09-28 NOTE — Patient Instructions (Signed)
Great to see you. Take lamisil as directed- 1 tablet daily x 6 weeks. Please call me in 1 week if symptoms not better, come see me in 6 weeks.  We are referring you to a gynecologist to discuss endometrial ablation.

## 2014-09-28 NOTE — Assessment & Plan Note (Signed)
New- ? Fungal.  Treat with 6 weeks of lamisil. LFTs today.  Follow up in 6 weeks, sooner if symptoms worsen. The patient indicates understanding of these issues and agrees with the plan.

## 2014-09-28 NOTE — Progress Notes (Signed)
Subjective:   Patient ID: Nancy Hall, female    DOB: 1971-01-21, 44 y.o.   MRN: [id number]  Nancy Hall is a pleasant 44 y.o. year old female who presents to clinic today with her mom for Rash and abnormal period  on 09/28/2014  HPI:  Rash- started on and around her toe nails, now spread to top of her left foot.  Very itchy.  Has blistered.  Has been there for months.  Mom has tried topical neosporin without much improvement. No fevers or drainage.  Menorrhagia- Persistent issue despite being on OCPs.  Periods getting heavier and more irregular.  Current Outpatient Prescriptions on File Prior to Visit  Medication Sig Dispense Refill  . ARIPiprazole (ABILIFY) 15 MG tablet TAKE 1 TABLET BY MOUTH EVERY DAY 30 tablet 0  . JUNEL FE 1.5/30 1.5-30 MG-MCG tablet TAKE 1 TABLET BY MOUTH EVERY DAY 28 tablet 0  . lamoTRIgine (LAMICTAL) 150 MG tablet TAKE 1 TABLET BY MOUTH 2 TIMES DAILY. 60 tablet 3  . levothyroxine (SYNTHROID, LEVOTHROID) 125 MCG tablet TAKE 2 TABLETS BY MOUTH EVERY DAY 60 tablet 5  . levothyroxine (SYNTHROID, LEVOTHROID) 75 MCG tablet TAKE 1 TABLET BY MOUTH EVERY DAY 30 tablet 5  . liothyronine (CYTOMEL) 25 MCG tablet TAKE 1 TABLET BY MOUTH EVERY DAY 30 tablet 3  . omeprazole (PRILOSEC) 40 MG capsule TAKE 1 CAPSULE (40 MG TOTAL) BY MOUTH DAILY. 90 capsule 0  . sertraline (ZOLOFT) 100 MG tablet TAKE ONE TABLET BY MOUTH DAILY 30 tablet 2  . topiramate (TOPAMAX) 100 MG tablet TAKE 1 TABLET IN THE MORNING AND 1 & 1/2 TABLETS AT NIGHT 225 tablet 2   No current facility-administered medications on file prior to visit.    Allergies  Allergen Reactions  . Penicillins Rash    Past Medical History  Diagnosis Date  . Depression   . Thyroid disease   . Seizures     according to echart- seizures vs pseudoseizures  . Anxiety   . Sleep apnea   . Esophagitis     LA Class A  . Hiatal hernia   . Pseudoseizures   . Obesity     Past Surgical History  Procedure Laterality  Date  . Inner ear surgery Bilateral 1994    poor historian    Family History  Problem Relation Age of Onset  . Colon polyps Mother   . Celiac disease Mother   . Diabetes Paternal Grandmother   . Heart disease Paternal Grandfather   . Colon polyps Father   . Colon polyps Paternal Aunt   . Celiac disease Brother   . Diabetes Maternal Aunt   . Heart disease Maternal Grandfather   . Heart disease Maternal Uncle   . Heart disease Maternal Aunt   . Diabetes Sister   . Seizures Neg Hx     History   Social History  . Marital Status: Single    Spouse Name: N/A  . Number of Children: 0  . Years of Education: 10   Occupational History  . Not on file.   Social History Main Topics  . Smoking status: Never Smoker   . Smokeless tobacco: Never Used  . Alcohol Use: No  . Drug Use: No  . Sexual Activity: Not on file   Other Topics Concern  . Not on file   Social History Narrative   Patient lives at home with mom.    Patient does not have any children.    Patient has  a 10th grade education.    Patient is single.    Patient is left handed.    Does not have a living will or HPOA- full code.   The PMH, PSH, Social History, Family History, Medications, and allergies have been reviewed in [facility], and have been updated if relevant.   Lab Results  Component Value Date   ALT 15 03/04/2014   AST 13 03/04/2014   ALKPHOS 58 03/04/2014   BILITOT 0.4 03/04/2014     Review of Systems  Constitutional: Negative.   Respiratory: Negative.   Cardiovascular: Negative.   Genitourinary: Positive for vaginal bleeding and menstrual problem. Negative for vaginal pain and pelvic pain.  Skin: Positive for rash.  Neurological: Negative.   Psychiatric/Behavioral: Negative.  Negative for dysphoric mood.  All other systems reviewed and are negative.      Objective:    BP 124/72 mmHg  Pulse 88  Temp(Src) 98.1 F (36.7 C) (Oral)  Wt 273 lb (123.832 kg)  SpO2 98%  LMP 09/05/2014 (Within  Days)   Physical Exam  Constitutional: She is oriented to person, place, and time. She appears well-developed and well-nourished. No distress.  HENT:  Head: Normocephalic.  Eyes: Conjunctivae are normal.  Cardiovascular: Normal rate.   Pulmonary/Chest: Effort normal.  Musculoskeletal: Normal range of motion.  Neurological: She is alert and oriented to person, place, and time. No cranial nerve deficit.  Skin:     Psychiatric: She has a normal mood and affect. Her behavior is normal.  Nursing note and vitals reviewed.         Assessment & Plan:   Menorrhagia with irregular cycle  Rash and nonspecific skin eruption No Follow-up on file.

## 2014-09-28 NOTE — Progress Notes (Signed)
Pre visit review using our clinic review tool, if applicable. No additional management support is needed unless otherwise documented below in the visit note. 

## 2014-09-28 NOTE — Assessment & Plan Note (Signed)
>  25 minutes spent in face to face time with patient, >50% spent in counselling or coordination of care Persistent issue.  We discussed possibility of endometrial ablation. Pt and her mother would like to discuss this with GYN.  Referral placed.

## 2014-09-29 ENCOUNTER — Encounter: Payer: Self-pay | Admitting: *Deleted

## 2014-10-12 ENCOUNTER — Other Ambulatory Visit: Payer: Self-pay | Admitting: Family Medicine

## 2014-10-12 NOTE — Telephone Encounter (Signed)
Ok to refill? At last refill it said she needed a CPE, but she has since been in and has been referred to GYN.

## 2014-10-16 ENCOUNTER — Other Ambulatory Visit: Payer: Self-pay | Admitting: Family Medicine

## 2014-10-31 ENCOUNTER — Other Ambulatory Visit: Payer: Self-pay | Admitting: Neurology

## 2014-10-31 ENCOUNTER — Other Ambulatory Visit: Payer: Self-pay | Admitting: Family Medicine

## 2014-11-01 ENCOUNTER — Other Ambulatory Visit: Payer: Self-pay | Admitting: Family Medicine

## 2014-11-03 ENCOUNTER — Ambulatory Visit (INDEPENDENT_AMBULATORY_CARE_PROVIDER_SITE_OTHER): Payer: Medicare Other | Admitting: Obstetrics and Gynecology

## 2014-11-03 ENCOUNTER — Encounter: Payer: Self-pay | Admitting: Obstetrics and Gynecology

## 2014-11-03 DIAGNOSIS — N920 Excessive and frequent menstruation with regular cycle: Secondary | ICD-10-CM

## 2014-11-03 DIAGNOSIS — G40909 Epilepsy, unspecified, not intractable, without status epilepticus: Secondary | ICD-10-CM

## 2014-11-03 DIAGNOSIS — E038 Other specified hypothyroidism: Secondary | ICD-10-CM

## 2014-11-03 DIAGNOSIS — E034 Atrophy of thyroid (acquired): Secondary | ICD-10-CM

## 2014-11-04 LAB — HEMOGLOBIN AND HEMATOCRIT, BLOOD
HEMATOCRIT: 35.7 % (ref 34.0–46.6)
HEMOGLOBIN: 12.3 g/dL (ref 11.1–15.9)

## 2014-11-04 LAB — THYROID PANEL WITH TSH
Free Thyroxine Index: 1.3 (ref 1.2–4.9)
T3 Uptake Ratio: 21 % — ABNORMAL LOW (ref 24–39)
T4, Total: 6.3 ug/dL (ref 4.5–12.0)
TSH: 11.18 u[IU]/mL — AB (ref 0.450–4.500)

## 2014-11-06 ENCOUNTER — Encounter: Payer: Self-pay | Admitting: Obstetrics and Gynecology

## 2014-11-06 NOTE — Progress Notes (Addendum)
Subjective:     Nancy Hall is a 44 y.o. P0 female who presents as a referral from [place] for abnormal menses. Patient's last menstrual period was 10/19/2014. Menarche age: 67. Periods had been regular up until ~ 2 years ago, where they began to become heavier, lasting up to 2 weeks.  Uses overnight pads (approximately 2 packs per cycle).  Cycles still occur q monthly.  Dysmenorrhea:mild, occurring first 1-2 days of flow. Has been on OCPs for the past 2 years to help regulate menses, however is not helping.  Marland Kitchen History of abnormal Pap smear: no.  Last pap smear ~ 2-3 years ago (patient cannot recall).    Past Medical History  Diagnosis Date  . Depression   . Thyroid disease   . Seizures     according to echart- seizures vs pseudoseizures  . Anxiety   . Sleep apnea   . Esophagitis     LA Class A  . Hiatal hernia   . Pseudoseizures   . Obesity    Past Surgical History  Procedure Laterality Date  . Inner ear surgery Bilateral 1994    poor historian    Family History  Problem Relation Age of Onset  . Colon polyps Mother   . Celiac disease Mother   . Diabetes Paternal Grandmother   . Heart disease Paternal Grandfather   . Colon polyps Father   . Colon polyps Paternal Aunt   . Celiac disease Brother   . Diabetes Maternal Aunt   . Heart disease Maternal Grandfather   . Heart disease Maternal Uncle   . Heart disease Maternal Aunt   . Diabetes Sister   . Seizures Neg Hx     Medication Sig  . ARIPiprazole (ABILIFY) 15 MG tablet TAKE 1 TABLET BY MOUTH EVERY DAY  . JUNEL FE 1.5/30 1.5-30 MG-MCG tablet TAKE 1 TABLET BY MOUTH EVERY DAY  . lamoTRIgine (LAMICTAL) 150 MG tablet TAKE 1 TABLET BY MOUTH 2 TIMES DAILY.  Marland Kitchen levothyroxine (SYNTHROID, LEVOTHROID) 125 MCG tablet TAKE 2 TABLETS BY MOUTH EVERY DAY  . levothyroxine (SYNTHROID, LEVOTHROID) 75 MCG tablet TAKE 1 TABLET BY MOUTH EVERY DAY  . liothyronine (CYTOMEL) 25 MCG tablet TAKE 1 TABLET BY MOUTH EVERY DAY  .  omeprazole (PRILOSEC) 40 MG capsule TAKE 1 CAPSULE (40 MG TOTAL) BY MOUTH DAILY.  Marland Kitchen sertraline (ZOLOFT) 100 MG tablet TAKE ONE TABLET BY MOUTH DAILY  . terbinafine (LAMISIL) 250 MG tablet Take 1 tablet (250 mg total) by mouth daily.  Marland Kitchen topiramate (TOPAMAX) 100 MG tablet TAKE 1 TABLET IN THE MORNING AND 1 & 1/2 TABLETS AT NIGHT    Allergies  Allergen Reactions  . Penicillins Rash     Review of Systems Pertinent items are noted in HPI.     Objective:    BP 108/75 mmHg  Pulse 69  Ht 5\' 6"  (1.676 m)  Wt 269 lb 2 oz (122.074 kg)  BMI 43.46 kg/m2  LMP 10/19/2014 General appearance: alert and no distress, obese Abdomen: soft, non-tender; bowel sounds normal; no masses,  no organomegaly Pelvic: external genitalia normal, rectovaginal septum normal and vagina normal without discharge. Cervix without lesions or CMT, nulliparous. Uterus mobile, notender, difficult to assess size due to body habitus. Adnexae nontender, nonpalpable.  Extremities: extremities normal, atraumatic, no cyanosis or edema Neurologic: Grossly normal       Assessment:    The patient has menorrhagia.   Obesity Hypothyroidism Seizure disorder  Plan:    Diagnosis explained in  detail. All questions answered. Neurosurgeon distributed. Blood tests: CBC with diff and TSH. Pelvic ultrasound.   Discussed that metabolism of OCPs can interfere with metabolism of other drugs (including thyroid and seizure medicaitons).  Would recommend a treatment that does not require oral digestion (i.e. Mirena IUD, Nuva Ring) or surgical management with endometrial ablation (Novasure/Hydrothermal Ablation) or hysterectomy .  Would not recommend Nexplanon or Depo Provera due to patient's obesity and being less effective.  Patient considering IUD. Handout given. RTC in 1-2 weeks for discussion of all results and management options.  Will need endometrial biopsy at that time.  Discussed obesity as a risk factor for abnormal  uterine bleeding, endometrial hyperplasia.     Rubie Maid, MD [facility]

## 2014-11-08 ENCOUNTER — Ambulatory Visit: Payer: Medicare Other

## 2014-11-08 DIAGNOSIS — N920 Excessive and frequent menstruation with regular cycle: Secondary | ICD-10-CM

## 2014-11-12 ENCOUNTER — Other Ambulatory Visit: Payer: Self-pay | Admitting: Family Medicine

## 2014-11-17 ENCOUNTER — Ambulatory Visit (INDEPENDENT_AMBULATORY_CARE_PROVIDER_SITE_OTHER): Payer: Medicare Other | Admitting: Family Medicine

## 2014-11-17 ENCOUNTER — Encounter: Payer: Self-pay | Admitting: Family Medicine

## 2014-11-17 ENCOUNTER — Telehealth: Payer: Self-pay | Admitting: Family Medicine

## 2014-11-17 VITALS — BP 124/78 | HR 75 | Temp 98.2°F | Wt 270.0 lb

## 2014-11-17 DIAGNOSIS — R1310 Dysphagia, unspecified: Secondary | ICD-10-CM | POA: Diagnosis not present

## 2014-11-17 DIAGNOSIS — R21 Rash and other nonspecific skin eruption: Secondary | ICD-10-CM

## 2014-11-17 NOTE — Telephone Encounter (Signed)
Spoke to pts mother who is agreeable to see Dr Veatrice Kells. Advised her referral to be placed and to await a call with appt details

## 2014-11-17 NOTE — Addendum Note (Signed)
Addended by: Lucille Passy on: 11/17/2014 11:46 AM   Modules accepted: Orders

## 2014-11-17 NOTE — Patient Instructions (Signed)
Your rash looks great. No further treatment needed.  For choking on foods, we should send you to a GI (stomach) doctor to do an endoscopy (put a light down your throat to look at your esophagus).  Please ask your mom to call me about this.

## 2014-11-17 NOTE — Progress Notes (Signed)
Pre visit review using our clinic review tool, if applicable. No additional management support is needed unless otherwise documented below in the visit note. 

## 2014-11-17 NOTE — Telephone Encounter (Signed)
Yes I know she has seen Dr. Fuller Plan in past.  Is she ok if I refer her back to see him?

## 2014-11-17 NOTE — Telephone Encounter (Signed)
Mom called back she wanted [facility] to go ahead and get gi appointment for endoscopy

## 2014-11-17 NOTE — Progress Notes (Signed)
Subjective:   Patient ID: Nancy Hall, female    DOB: 1970/12/13, 44 y.o.   MRN: [id number]  Nancy Hall is a pleasant 44 y.o. year old female who presents to clinic today with Follow-up  on 11/17/2014  HPI:  Here for follow up rash, also complaining of difficulty swallowing.  I saw her on 09/28/14 (note reviewed) for several month history of blistery rash around her toenails that spread to the top of her left foot.  Very itchy.  Topical abx ointment was not helping.  Started her on oral lamisil- 250 mg daily.  Rash has resolved.  Lab Results  Component Value Date   ALT 13 09/28/2014   AST 13 09/28/2014   ALKPHOS 65 09/28/2014   BILITOT 0.4 09/28/2014   Dysphagia- past several months, choking solids, not liquids. Does not smoke cigarettes or drink ETOH. Feels it is getting progressively worse. Saw Dr. Fuller Plan for this complaint in 2014- endoscopy consistent with erosive esophagitis.                        Current Outpatient Prescriptions on File Prior to Visit  Medication Sig Dispense Refill  . ARIPiprazole (ABILIFY) 15 MG tablet TAKE 1 TABLET BY MOUTH EVERY DAY 30 tablet 0  . JUNEL FE 1.5/30 1.5-30 MG-MCG tablet TAKE 1 TABLET BY MOUTH EVERY DAY 28 tablet 0  . lamoTRIgine (LAMICTAL) 150 MG tablet TAKE 1 TABLET BY MOUTH 2 TIMES DAILY. 60 tablet 3  . levothyroxine (SYNTHROID, LEVOTHROID) 125 MCG tablet TAKE 2 TABLETS BY MOUTH EVERY DAY 60 tablet 5  . levothyroxine (SYNTHROID, LEVOTHROID) 75 MCG tablet TAKE 1 TABLET BY MOUTH EVERY DAY 30 tablet 5  . liothyronine (CYTOMEL) 25 MCG tablet TAKE 1 TABLET BY MOUTH EVERY DAY 30 tablet 3  . omeprazole (PRILOSEC) 40 MG capsule TAKE 1 CAPSULE (40 MG TOTAL) BY MOUTH DAILY. 90 capsule 0  . sertraline (ZOLOFT) 100 MG tablet TAKE ONE TABLET BY MOUTH DAILY 30 tablet 2  . topiramate (TOPAMAX) 100 MG tablet TAKE 1 TABLET IN THE MORNING AND 1 & 1/2 TABLETS AT NIGHT 225 tablet 2   No current facility-administered medications on file prior to  visit.    Allergies  Allergen Reactions  . Penicillins Rash    Past Medical History  Diagnosis Date  . Depression   . Thyroid disease   . Seizures     according to echart- seizures vs pseudoseizures  . Anxiety   . Sleep apnea   . Esophagitis     LA Class A  . Hiatal hernia   . Pseudoseizures   . Obesity     Past Surgical History  Procedure Laterality Date  . Inner ear surgery Bilateral 1994    poor historian    Family History  Problem Relation Age of Onset  . Colon polyps Mother   . Celiac disease Mother   . Diabetes Paternal Grandmother   . Heart disease Paternal Grandfather   . Colon polyps Father   . Colon polyps Paternal Aunt   . Celiac disease Brother   . Diabetes Maternal Aunt   . Heart disease Maternal Grandfather   . Heart disease Maternal Uncle   . Heart disease Maternal Aunt   . Diabetes Sister   . Seizures Neg Hx     Social History   Social History  . Marital Status: Single    Spouse Name: N/A  . Number of Children: 0  . Years of  Education: 10   Occupational History  . Not on file.   Social History Main Topics  . Smoking status: Never Smoker   . Smokeless tobacco: Never Used  . Alcohol Use: No  . Drug Use: No  . Sexual Activity: No   Other Topics Concern  . Not on file   Social History Narrative   Patient lives at home with mom.    Patient does not have any children.    Patient has a 10th grade education.    Patient is single.    Patient is left handed.    Does not have a living will or HPOA- full code.   The PMH, PSH, Social History, Family History, Medications, and allergies have been reviewed in [facility], and have been updated if relevant.   Review of Systems  Constitutional: Negative.   HENT: Positive for trouble swallowing. Negative for voice change.   Respiratory: Negative.   Cardiovascular: Negative.   Allergic/Immunologic: Negative.   Neurological: Negative.   Hematological: Negative.   Psychiatric/Behavioral:  Negative.   All other systems reviewed and are negative.      Objective:    BP 124/78 mmHg  Pulse 75  Temp(Src) 98.2 F (36.8 C) (Oral)  Wt 270 lb (122.471 kg)  SpO2 98%  LMP 10/19/2014   Physical Exam  Constitutional: She is oriented to person, place, and time. She appears well-developed and well-nourished. No distress.  HENT:  Head: Normocephalic.  Eyes: Conjunctivae are normal.  Neck: Normal range of motion. No thyromegaly present.  Cardiovascular: Normal rate.   Musculoskeletal: Normal range of motion.  Neurological: She is alert and oriented to person, place, and time. No cranial nerve deficit.  Skin: Skin is warm and dry. No rash noted.  Rash resolved- toe nails no longer discolored, blisters resolved.  Nursing note and vitals reviewed.         Assessment & Plan:   Rash and nonspecific skin eruption No Follow-up on file.

## 2014-11-17 NOTE — Assessment & Plan Note (Addendum)
Deteriorated -refer to back to GI for another possible endoscopy to rule out esophageal stricture/achalasia. She wants to talk with her mom first before I place referral- see AVS.

## 2014-11-17 NOTE — Telephone Encounter (Signed)
Mom said Nancy Hall has seen dr star @ [facility]

## 2014-11-17 NOTE — Assessment & Plan Note (Signed)
Resolved. No further rx needed. Call or return to clinic prn if these symptoms worsen or fail to improve as anticipated. The patient indicates understanding of these issues and agrees with the plan.

## 2014-11-17 NOTE — Telephone Encounter (Signed)
Referral placed.

## 2014-11-18 ENCOUNTER — Ambulatory Visit (INDEPENDENT_AMBULATORY_CARE_PROVIDER_SITE_OTHER): Payer: Medicare Other | Admitting: Obstetrics and Gynecology

## 2014-11-18 ENCOUNTER — Encounter: Payer: Self-pay | Admitting: Obstetrics and Gynecology

## 2014-11-18 VITALS — BP 105/74 | HR 72 | Ht 66.0 in | Wt 269.5 lb

## 2014-11-18 DIAGNOSIS — D259 Leiomyoma of uterus, unspecified: Secondary | ICD-10-CM

## 2014-11-18 DIAGNOSIS — E034 Atrophy of thyroid (acquired): Secondary | ICD-10-CM

## 2014-11-18 DIAGNOSIS — N920 Excessive and frequent menstruation with regular cycle: Secondary | ICD-10-CM | POA: Diagnosis not present

## 2014-11-18 DIAGNOSIS — E038 Other specified hypothyroidism: Secondary | ICD-10-CM

## 2014-11-18 NOTE — Progress Notes (Signed)
Patient ID: Nancy Hall, female   DOB: 1970/05/13, 44 y.o.   MRN: [id number] 2 week f/u on aub Results- lab and u/s Discuss management options Per last note needs emb

## 2014-11-19 NOTE — Progress Notes (Addendum)
GYNECOLOGY PROGRESS NOTE  Subjective:    Patient ID: Nancy Hall, female    DOB: [birth year], 44 y.o.   MRN: [id number]  HPI  Patient is a 45 y.o. P0 female with h/o hypothyroidism and seizure disorder who presents for f/u of results for workup of menorrhagia, and for endometrial biopsy.   The following portions of the patient's history were reviewed and updated as appropriate: allergies, current medications, past family history, past medical history, past social history, past surgical history and problem list.  Review of Systems A comprehensive review of systems was negative except for: Genitourinary: positive for abnormal menstrual periods   Objective:   Blood pressure 105/74, pulse 72, height 5\' 6"  (1.676 m), weight 269 lb 8 oz (122.244 kg), last menstrual period 10/19/2014. General appearance: alert and no distress Abdomen: soft, non-tender; bowel sounds normal; no masses,  no organomegaly Pelvic: external genitalia normal, rectovaginal septum normal and vagina normal without discharge. Cervix without lesions or CMT, nulliparous. Uterus mobile, notender, difficult to assess size due to body habitus but enlarged. Adnexae nontender, nonpalpable. Patient was not able to tolerate speculum exam for more than 2 minutes.  Extremities: extremities normal, atraumatic, no cyanosis or edema Neurologic: Grossly normal  Psychologic: appears to have mild mental delay.    Labs:  Lab Results  Component Value Date   TSH 11.180* 11/03/2014   T4TOTAL 6.3 11/03/2014   Lab Results  Component Value Date   WBC 5.4 09/14/2013   HGB 11.6* 09/14/2013   HCT 35.7 11/03/2014   MCV 95.6 09/14/2013   PLT 141.0* 09/14/2013   Imaging:  11/08/2014 Pelvic Ultrasound:  Findings: Transvaginal approach was attempted, however, not successful. Patient was unable to tolerate the probe. The uterus measures 12.3 x 11.5 x 11.2 cm. Echo texture is heterogenous with evidence of one large focal mass. Within the  uterus is one suspected fibroid measuring: 10.6 x 9.9 x 6.4 cm.  The Endometrium measures 3.5 mm.  Right Ovary is not visualized.  Left Ovary is not visualized.  Survey of the adnexa demonstrates no adnexal masses.  There is no free fluid in the cul de sac.  Assessment:   1. Menorrhagia 2. Obesity 3. H/o hypothyroidism 4. H/o seizure disorder 5. Fibroid uterus  Plan:   1. Menorrhagia with large fibroid uterus - attempted endometrial biopsy today, however patient unable to tolerate pelvic exam.  Recommend D&C in operating room to assess uterine cavity (as patient with risk factors for hyperplasia, including obesity and abnormal bleeding). Risks of procedure discussed.  Will schedule for Hysteroscopy D&C in 3 weeks.  Pre-op done today, consent forms signed. Further discussed management options for patient regarding abnormal bleeding with large fibroid, including Discussed management options for abnormal uterine bleeding including tranexamic acid (Lysteda), Depo Provera, Lupron, hysterectomy as definitive surgical management.  Discussed risks and benefits of each method.   Patient unsure of desires at this point.  To discuss further after D&C. Is currently on combined OCPs (although not helping).  Printed patient education handouts were given to the patient to review at home.  Can perform pap smear at time of procedure.  2. H/o hypothyroidism - TSH elevated but T4 wnl.  Notes compliance with medications.  3. H/o seizure disorder.   No h/o recent seizures. Notes compliance with seizure medications.   A total of 25 minutes were spent face-to-face with the patient during this encounter and over half of that time involved counseling and coordination of care.  Rubie Maid, MD [facility]

## 2014-11-19 NOTE — H&P (Addendum)
Subjective:    Patient is a 44 y.o. G0P0 female scheduled for Hysteroscopy D&C. Indications for procedure are menorrhagia x 2 years, inability to tolerate office office pelvic exam with biopsy, mild mental delay.   Pertinent Gynecological History:  Menses: flow is excessive with use of 6-8 pads or tampons on heaviest days and regular every 28 days without intermenstrual spotting Bleeding: heavy menstrual bleeding Contraception: OCP (estrogen/progesterone) Last mammogram: normal  Last pap: normal Date: 07/2011  Discussed Blood/Blood Products: no   Menstrual History: OB History    Gravida Para Term Preterm AB TAB SAB Ectopic Multiple Living   0 0 0 0 0 0 0 0 0 0       Menarche age: 55  Patient's last menstrual period was 10/19/2014.    Past Medical History  Diagnosis Date  . Depression   . Thyroid disease   . Seizures     according to echart- seizures vs pseudoseizures  . Anxiety   . Sleep apnea   . Esophagitis     LA Class A  . Hiatal hernia   . Pseudoseizures   . Obesity     Past Surgical History  Procedure Laterality Date  . Inner ear surgery Bilateral 1994    poor historian    Social History   Social History  . Marital Status: Single    Spouse Name: N/A  . Number of Children: 0  . Years of Education: 10   Social History Main Topics  . Smoking status: Never Smoker   . Smokeless tobacco: Never Used  . Alcohol Use: No  . Drug Use: No  . Sexual Activity: No   Other Topics Concern  . None   Social History Narrative   Patient lives at home with mom.    Patient does not have any children.    Patient has a 10th grade education.    Patient is single.    Patient is left handed.    Does not have a living will or HPOA- full code.    Family History  Problem Relation Age of Onset  . Colon polyps Mother   . Celiac disease Mother   . Diabetes Paternal Grandmother   . Heart disease Paternal Grandfather   . Colon polyps Father   . Colon polyps Paternal  Aunt   . Celiac disease Brother   . Diabetes Maternal Aunt   . Heart disease Maternal Grandfather   . Heart disease Maternal Uncle   . Heart disease Maternal Aunt   . Diabetes Sister   . Seizures Neg Hx     Allergies  Allergen Reactions  . Penicillins Rash    Review of Systems Constitutional: No recent fever/chills/sweats Respiratory: No recent cough/bronchitis Cardiovascular: No chest pain Gastrointestinal: No recent nausea/vomiting/diarrhea Genitourinary: No UTI symptoms Hematologic/lymphatic:No history of coagulopathy or recent blood thinner use    Objective:    BP 105/74 mmHg  Pulse 72  Ht 5\' 6"  (1.676 m)  Wt 269 lb 8 oz (122.244 kg)  BMI 43.52 kg/m2  LMP 10/19/2014  General:   Normal, obese  Skin:   normal  HEENT:  Normal  Neck:  Supple without Adenopathy or Thyromegaly  Lungs:   Heart:              Breasts:   Abdomen:  Pelvis:  M/S   Extremeties:  Neuro:  Psych:   clear to auscultation bilaterally   Normal without murmur   Not Examined   soft, non-tender; bowel sounds normal; no masses,  no organomegaly   Exam deferred to OR  No CVAT  Warm/Dry   Normal  Mild mental delay        Labs:  Lab Results  Component Value Date   TSH 11.180* 11/03/2014   Lab Results  Component Value Date   WBC 5.4 09/14/2013   HGB 11.6* 09/14/2013   HCT 35.7 11/03/2014   MCV 95.6 09/14/2013   PLT 141.0* 09/14/2013    Imaging:  11/08/2014 Pelvic Ultrasound:  Findings: Transvaginal approach was attempted, however, not successful. Patient was unable to tolerate the probe. The uterus measures 12.3 x 11.5 x 11.2 cm. Echo texture is heterogenous with evidence of one large focal mass. Within  the uterus is one suspected fibroid measuring: 10.6 x 9.9 x 6.4 cm.  The Endometrium measures 3.5 mm.  Right Ovary is not visualized. Left Ovary is not visualized. Survey of the adnexa demonstrates no adnexal masses. There is no free fluid in the cul de sac.  Assessment:     1. Menorrhagia 2. Obesity 3. H/o hypothyroidism 4. H/o seizure disorder 5. Fibroid uterus   Plan:  1. Menorrhagia with large fibroid uterus - attempted endometrial biopsy today, however patient unable to tolerate pelvic exam. Recommend D&C in operating room to assess uterine cavity (as patient with risk factors for hyperplasia, including obesity and abnormal bleeding). Risks of procedure discussed. Will schedule for Hysteroscopy D&C in 3 weeks. Counseling: Procedure, risks, reasons, benefits and complications (including injury to bowel, bladder, bleeding, possibility of transfusion, infection, or fistula formation) reviewed in detail. Consent signed. Preop testing ordered. nstructions reviewed, including NPO after midnight.  Further discussed management options for patient regarding abnormal bleeding with large fibroid, including Discussed management options for abnormal uterine bleeding including tranexamic acid (Lysteda), Depo Provera, Lupron, hysterectomy as definitive surgical management. Discussed risks and benefits of each method. Patient unsure of desires at this point. To discuss further after D&C. Is currently on combined OCPs (although not helping). Printed patient education handouts were given to the patient to review at home.  Can perform pap smear at time of procedure.  2. H/o hypothyroidism - TSH elevated but T4 wnl. Notes compliance with medications.  3. H/o seizure disorder. No h/o recent seizures.  Notes compliance with seizure medications.    Rubie Maid, MD [facility]

## 2014-12-05 ENCOUNTER — Other Ambulatory Visit: Payer: Self-pay | Admitting: Family Medicine

## 2014-12-07 ENCOUNTER — Encounter
Admission: RE | Admit: 2014-12-07 | Discharge: 2014-12-07 | Disposition: A | Discharge status: Home / Self Care | Payer: Medicare Other | Source: Ambulatory Visit | Attending: Obstetrics and Gynecology | Admitting: Obstetrics and Gynecology

## 2014-12-07 DIAGNOSIS — Z01812 Encounter for preprocedural laboratory examination: Secondary | ICD-10-CM | POA: Diagnosis not present

## 2014-12-07 HISTORY — DX: Bipolar disorder, unspecified: F31.9

## 2014-12-07 HISTORY — DX: Gastro-esophageal reflux disease without esophagitis: K21.9

## 2014-12-07 HISTORY — DX: Hypothyroidism, unspecified: E03.9

## 2014-12-07 HISTORY — DX: Unspecified convulsions: R56.9

## 2014-12-07 HISTORY — DX: Anemia, unspecified: D64.9

## 2014-12-07 LAB — CBC
HEMATOCRIT: 39.1 % (ref 35.0–47.0)
HEMOGLOBIN: 13 g/dL (ref 12.0–16.0)
MCH: 32 pg (ref 26.0–34.0)
MCHC: 33.2 g/dL (ref 32.0–36.0)
MCV: 96.5 fL (ref 80.0–100.0)
Platelets: 145 10*3/uL — ABNORMAL LOW (ref 150–440)
RBC: 4.05 MIL/uL (ref 3.80–5.20)
RDW: 13.2 % (ref 11.5–14.5)
WBC: 6 10*3/uL (ref 3.6–11.0)

## 2014-12-07 NOTE — Patient Instructions (Signed)
  Your procedure is scheduled on: December 12, 2014 (Monday) Report to Day Surgery.[facility], Second Floor) To find out your arrival time please call [phone] between 1PM - 3PM on .December 09, 2014 (Friday)  Remember: Instructions that are not followed completely may result in serious medical risk, up to and including death, or upon the discretion of your surgeon and anesthesiologist your surgery may need to be rescheduled.    __x__ 1. Do not eat food or drink liquids after midnight. No gum chewing or hard candies.     ____ 2. No Alcohol for 24 hours before or after surgery.   ____ 3. Bring all medications with you on the day of surgery if instructed.    __x__ 4. Notify your doctor if there is any change in your medical condition     (cold, fever, infections).     Do not wear jewelry, make-up, hairpins, clips or nail polish.  Do not wear lotions, powders, or perfumes. You may wear deodorant.  Do not shave 48 hours prior to surgery. Men may shave face and neck.  Do not bring valuables to the hospital.    [facility] is not responsible for any belongings or valuables.               Contacts, dentures or bridgework may not be worn into surgery.  Leave your suitcase in the car. After surgery it may be brought to your room.  For patients admitted to the hospital, discharge time is determined by your                treatment team.   Patients discharged the day of surgery will not be allowed to drive home.   Please read over the following fact sheets that you were given:     __x__ Take these medicines the morning of surgery with A SIP OF WATER:    1. Topiramate  2. Lamictal (Lamotrigine)  3. Prilosec  4.  5.  6.  ____ Fleet Enema (as directed)   ____ Use CHG Soap as directed  ____ Use inhalers on the day of surgery  ____ Stop metformin 2 days prior to surgery    ____ Take 1/2 of usual insulin dose the night before surgery and none on the morning of surgery.   ____  Stop Coumadin/Plavix/aspirin on   ____ Stop Anti-inflammatories on    ____ Stop supplements until after surgery.    ____ Bring C-Pap to the hospital.

## 2014-12-10 ENCOUNTER — Other Ambulatory Visit: Payer: Self-pay | Admitting: Family Medicine

## 2014-12-19 ENCOUNTER — Encounter
Admission: RE | Disposition: A | Discharge status: Home / Self Care | Payer: Self-pay | Source: Ambulatory Visit | Attending: Obstetrics and Gynecology

## 2014-12-19 ENCOUNTER — Encounter: Payer: Self-pay | Admitting: *Deleted

## 2014-12-19 ENCOUNTER — Ambulatory Visit: Payer: Medicare Other | Admitting: Anesthesiology

## 2014-12-19 ENCOUNTER — Ambulatory Visit
Admission: RE | Admit: 2014-12-19 | Discharge: 2014-12-19 | Disposition: A | Discharge status: Home / Self Care | Payer: Medicare Other | Source: Ambulatory Visit | Attending: Obstetrics and Gynecology | Admitting: Obstetrics and Gynecology

## 2014-12-19 DIAGNOSIS — F419 Anxiety disorder, unspecified: Secondary | ICD-10-CM | POA: Insufficient documentation

## 2014-12-19 DIAGNOSIS — K219 Gastro-esophageal reflux disease without esophagitis: Secondary | ICD-10-CM | POA: Diagnosis not present

## 2014-12-19 DIAGNOSIS — Z8249 Family history of ischemic heart disease and other diseases of the circulatory system: Secondary | ICD-10-CM | POA: Diagnosis not present

## 2014-12-19 DIAGNOSIS — Z833 Family history of diabetes mellitus: Secondary | ICD-10-CM | POA: Insufficient documentation

## 2014-12-19 DIAGNOSIS — Z820 Family history of epilepsy and other diseases of the nervous system: Secondary | ICD-10-CM | POA: Diagnosis not present

## 2014-12-19 DIAGNOSIS — E039 Hypothyroidism, unspecified: Secondary | ICD-10-CM | POA: Diagnosis not present

## 2014-12-19 DIAGNOSIS — D259 Leiomyoma of uterus, unspecified: Secondary | ICD-10-CM | POA: Insufficient documentation

## 2014-12-19 DIAGNOSIS — N920 Excessive and frequent menstruation with regular cycle: Secondary | ICD-10-CM | POA: Diagnosis not present

## 2014-12-19 DIAGNOSIS — Z8379 Family history of other diseases of the digestive system: Secondary | ICD-10-CM | POA: Diagnosis not present

## 2014-12-19 DIAGNOSIS — Z6841 Body Mass Index (BMI) 40.0 and over, adult: Secondary | ICD-10-CM | POA: Insufficient documentation

## 2014-12-19 DIAGNOSIS — Z880 Allergy status to penicillin: Secondary | ICD-10-CM | POA: Insufficient documentation

## 2014-12-19 DIAGNOSIS — E669 Obesity, unspecified: Secondary | ICD-10-CM | POA: Insufficient documentation

## 2014-12-19 DIAGNOSIS — K449 Diaphragmatic hernia without obstruction or gangrene: Secondary | ICD-10-CM | POA: Insufficient documentation

## 2014-12-19 DIAGNOSIS — G40909 Epilepsy, unspecified, not intractable, without status epilepticus: Secondary | ICD-10-CM | POA: Diagnosis not present

## 2014-12-19 DIAGNOSIS — F319 Bipolar disorder, unspecified: Secondary | ICD-10-CM | POA: Diagnosis not present

## 2014-12-19 DIAGNOSIS — G4730 Sleep apnea, unspecified: Secondary | ICD-10-CM | POA: Diagnosis not present

## 2014-12-19 DIAGNOSIS — N946 Dysmenorrhea, unspecified: Secondary | ICD-10-CM | POA: Diagnosis not present

## 2014-12-19 DIAGNOSIS — K635 Polyp of colon: Secondary | ICD-10-CM | POA: Insufficient documentation

## 2014-12-19 HISTORY — PX: HYSTEROSCOPY W/D&C: SHX1775

## 2014-12-19 LAB — POCT PREGNANCY, URINE: Preg Test, Ur: NEGATIVE

## 2014-12-19 SURGERY — DILATATION AND CURETTAGE /HYSTEROSCOPY
Anesthesia: General

## 2014-12-19 MED ORDER — LACTATED RINGERS IV SOLN
INTRAVENOUS | Status: DC
  Administered 2014-12-19: 10:00:00 via INTRAVENOUS
  Administered 2014-12-19: 125 mL/h via INTRAVENOUS

## 2014-12-19 MED ORDER — ONDANSETRON HCL 4 MG/2ML IJ SOLN
INTRAMUSCULAR | Status: DC | PRN
  Administered 2014-12-19: 4 mg via INTRAVENOUS

## 2014-12-19 MED ORDER — PROMETHAZINE HCL 25 MG/ML IJ SOLN: 6.2500 mg | INTRAMUSCULAR | Status: DC | PRN

## 2014-12-19 MED ORDER — LIDOCAINE HCL (CARDIAC) 20 MG/ML IV SOLN
INTRAVENOUS | Status: DC | PRN
Start: 2014-12-19 — End: 2014-12-19
  Administered 2014-12-19: 40 mg via INTRAVENOUS

## 2014-12-19 MED ORDER — FENTANYL CITRATE (PF) 100 MCG/2ML IJ SOLN
INTRAMUSCULAR | Status: DC | PRN
  Administered 2014-12-19 (×2): 25 ug via INTRAVENOUS
  Administered 2014-12-19: 50 ug via INTRAVENOUS

## 2014-12-19 MED ORDER — PROPOFOL 10 MG/ML IV BOLUS
INTRAVENOUS | Status: DC | PRN
  Administered 2014-12-19: 200 mg via INTRAVENOUS

## 2014-12-19 MED ORDER — FAMOTIDINE 20 MG PO TABS
ORAL_TABLET | ORAL | Status: AC
  Filled 2014-12-19: qty 1

## 2014-12-19 MED ORDER — IBUPROFEN 800 MG PO TABS: 800.0000 mg | ORAL_TABLET | Freq: Three times a day (TID) | ORAL | Status: DC | PRN

## 2014-12-19 MED ORDER — FAMOTIDINE 20 MG PO TABS
20.0000 mg | ORAL_TABLET | Freq: Once | ORAL | Status: AC
  Administered 2014-12-19: 20 mg via ORAL

## 2014-12-19 MED ORDER — FENTANYL CITRATE (PF) 100 MCG/2ML IJ SOLN: 25.0000 ug | INTRAMUSCULAR | Status: DC | PRN

## 2014-12-19 MED ORDER — ACETAMINOPHEN-CODEINE #3 300-30 MG PO TABS: 1.0000 | ORAL_TABLET | ORAL | Status: DC | PRN

## 2014-12-19 MED ORDER — MIDAZOLAM HCL 2 MG/2ML IJ SOLN
INTRAMUSCULAR | Status: DC | PRN
  Administered 2014-12-19: 2 mg via INTRAVENOUS

## 2014-12-19 SURGICAL SUPPLY — 14 items
CATH ROBINSON RED A/P 16FR (CATHETERS) ×3 IMPLANT
GLOVE BIO SURGEON STRL SZ 6 (GLOVE) ×18 IMPLANT
GLOVE BIOGEL PI IND STRL 6.5 (GLOVE) ×1 IMPLANT
GLOVE BIOGEL PI INDICATOR 6.5 (GLOVE) ×2
GOWN STRL REUS W/ TWL LRG LVL3 (GOWN DISPOSABLE) ×2 IMPLANT
GOWN STRL REUS W/TWL LRG LVL3 (GOWN DISPOSABLE) ×4
IV LACTATED RINGERS 1000ML (IV SOLUTION) ×3 IMPLANT
KIT RM TURNOVER CYSTO AR (KITS) ×3 IMPLANT
PACK DNC HYST (MISCELLANEOUS) ×3 IMPLANT
PAD OB MATERNITY 4.3X12.25 (PERSONAL CARE ITEMS) ×3 IMPLANT
PAD PREP 24X41 OB/GYN DISP (PERSONAL CARE ITEMS) ×3 IMPLANT
SOL PREP PVP 2OZ (MISCELLANEOUS) ×3
SOLUTION PREP PVP 2OZ (MISCELLANEOUS) ×1 IMPLANT
SURGILUBE 2OZ TUBE FLIPTOP (MISCELLANEOUS) ×3 IMPLANT

## 2014-12-19 NOTE — Op Note (Addendum)
DILATATION AND CURETTAGE /HYSTEROSCOPY Procedure Note  Nancy Hall female 44 y.o. 12/19/2014  Preoperative Diagnosis:  Heavy menstrual bleeding, fibroid uterus  Postoperative Diagnosis: Same  Procedure(s) and Anesthesia Type:    * DILATATION AND CURETTAGE /HYSTEROSCOPY - Choice  Surgeon(s) and Role:    * Rubie Maid, MD - Primary  Indications: The patient has a history of heavy menstrual cycles and fibroid uterus.  Was unable to tolerate office endometrial biopsy, which was necessary for workup of menorrhagia. The patient now presents for Hysteroscopy D&C after discussing therapeutic alternatives.        Surgeon: Rubie Maid, MD  Assistants: None  Anesthesia: General endotracheal anesthesia  ASA Class: 3  Findings:  A 10 week size uterus on bimanual exam.  Uterus sounded to 8 cm.  Mildly proliferative endometrium.  Unable to adequately visualize ostia bilaterally.  Procedure Detail The patient was taken to the operating room where general anesthesia was administered and was found to be adequate.  After an adequate timeout was performed, she was placed in the dorsal lithotomy position and examined; then prepped and draped in the sterile manner.   Her bladder was catheterized with return of 50 ml of clear urine. A speculum was then placed in the patient's vagina and a single tooth tenaculum was applied to the anterior lip of the cervix.  The cervix was sounded to 8 cm and dilated manually with metal dilators to accommodate the 5 mm diagnostic hysteroscope.  Once the cervix was dilated, the hysteroscope was inserted under direct visualization using glycine as a suspension medium.  The uterine cavity was carefully examined with the findings as noted above.  After further careful visualization of the uterine cavity, the hysteroscope was removed under direct visualization.  A sharp curettage was then performed to obtain a small amount of endometrial curettings.  The tenaculum was  removed from the anterior lip of the cervix and the vaginal speculum was removed after noting good hemostasis.  The patient tolerated the procedure well and was taken to the recovery area awake, extubated and in stable condition.  The patient will be discharged to home as per PACU criteria.  Routine postoperative instructions given.  She was prescribed Tylenol #3, and Ibuprofen.  She will follow up in the clinic in 2 weeks for postoperative evaluation.  Estimated Blood Loss:  Minimal         Drains: None         Total IV Fluids: 500 ml         Specimens: Endometrial curettings              Complications:  None         Disposition: PACU - hemodynamically stable.         Condition: stable    Rubie Maid, MD [facility]

## 2014-12-19 NOTE — Transfer of Care (Signed)
Immediate Anesthesia Transfer of Care Note  Patient: Nancy Hall  Procedure(s) Performed: Procedure(s): DILATATION AND CURETTAGE /HYSTEROSCOPY (N/A)  Patient Location: PACU  Anesthesia Type:General  Level of Consciousness: awake and alert   Airway & Oxygen Therapy: Patient connected to face mask oxygen  Post-op Assessment: Report given to RN and Post -op Vital signs reviewed and stable  Post vital signs: Reviewed and stable  Last Vitals:  Filed Vitals:   12/19/14 1045  BP: 116/77  Pulse: 68  Temp: 37.9 C  Resp: 13    Complications: No apparent anesthesia complications

## 2014-12-19 NOTE — Anesthesia Procedure Notes (Signed)
Procedure Name: LMA Insertion Date/Time: 12/19/2014 10:11 AM Performed by: Allean Found Pre-anesthesia Checklist: Patient identified, Emergency Drugs available, Suction available, Patient being monitored and Timeout performed Patient Re-evaluated:Patient Re-evaluated prior to inductionOxygen Delivery Method: Circle system utilized Preoxygenation: Pre-oxygenation with 100% oxygen Intubation Type: IV induction LMA: LMA inserted LMA Size: 4.0

## 2014-12-19 NOTE — H&P (Signed)
OB/GYN PREOPERATIVE HISTORY AND PHYSICAL  Subjective:    Patient is a 44 y.o. G0P0 female scheduled for Hysteroscopy D&C. Indications for procedure are menorrhagia x 2 years, inability to tolerate office office pelvic exam with biopsy, mild mental delay.   Pertinent Gynecological History:  Menses: flow is excessive with use of 6-8 pads or tampons on heaviest days and regular every 28 days without intermenstrual spotting Bleeding: heavy menstrual bleeding Contraception: OCP (estrogen/progesterone) Last mammogram: normal  Last pap: normal Date: 07/2011  Discussed Blood/Blood Products: no   Menstrual History: OB History    Gravida Para Term Preterm AB TAB SAB Ectopic Multiple Living   0 0 0 0 0 0 0 0 0 0       Menarche age: 42  No LMP recorded (lmp unknown).    Past Medical History  Diagnosis Date  . Depression   . Thyroid disease   . Anxiety   . Sleep apnea   . Esophagitis     LA Class A  . Hiatal hernia   . Pseudoseizures   . Obesity   . Bipolar disorder   . GERD (gastroesophageal reflux disease)   . Hypothyroidism   . Anemia 2016  . Seizures     according to echart- seizures vs pseudoseizures  . Seizure 2014    last seizure 2(two) years ago    Past Surgical History  Procedure Laterality Date  . Inner ear surgery Bilateral 1994    poor historian  . Tonsillectomy      Social History   Social History  . Marital Status: Single    Spouse Name: N/A  . Number of Children: 0  . Years of Education: 10   Social History Main Topics  . Smoking status: Never Smoker   . Smokeless tobacco: Never Used  . Alcohol Use: No  . Drug Use: No  . Sexual Activity: No   Other Topics Concern  . None   Social History Narrative   Patient lives at home with mom.    Patient does not have any children.    Patient has a 10th grade education.    Patient is single.    Patient is left handed.    Does not have a living will or HPOA- full code.    Family History  Problem  Relation Age of Onset  . Colon polyps Mother   . Celiac disease Mother   . Diabetes Paternal Grandmother   . Heart disease Paternal Grandfather   . Colon polyps Father   . Colon polyps Paternal Aunt   . Celiac disease Brother   . Diabetes Maternal Aunt   . Heart disease Maternal Grandfather   . Heart disease Maternal Uncle   . Heart disease Maternal Aunt   . Diabetes Sister   . Seizures Neg Hx     Allergies  Allergen Reactions  . Penicillins Rash    Review of Systems Constitutional: No recent fever/chills/sweats Respiratory: No recent cough/bronchitis Cardiovascular: No chest pain Gastrointestinal: No recent nausea/vomiting/diarrhea Genitourinary: No UTI symptoms Hematologic/lymphatic:No history of coagulopathy or recent blood thinner use    Objective:    BP 115/74 mmHg  Pulse 8  Temp(Src) 98.5 F (36.9 C) (Oral)  Resp 16  Ht 5\' 6"  (1.676 m)  Wt 270 lb (122.471 kg)  BMI 43.60 kg/m2  SpO2 100%  LMP  (LMP Unknown)  General:   Normal, obese  Skin:   normal  HEENT:  Normal  Neck:  Supple without Adenopathy or Thyromegaly  Lungs:  Heart:              Breasts:   Abdomen:  Pelvis:  M/S   Extremeties:  Neuro:  Psych:   clear to auscultation bilaterally   Normal without murmur   Not Examined   soft, non-tender; bowel sounds normal; no masses,  no organomegaly   Exam deferred to OR  No CVAT  Warm/Dry   Normal  Mild mental delay        Labs:  Lab Results  Component Value Date   TSH 11.180* 11/03/2014   Lab Results  Component Value Date   WBC 6.0 12/07/2014   HGB 13.0 12/07/2014   HCT 39.1 12/07/2014   MCV 96.5 12/07/2014   PLT 145* 12/07/2014    Imaging:  11/08/2014 Pelvic Ultrasound:  Findings: Transvaginal approach was attempted, however, not successful. Patient was unable to tolerate the probe. The uterus measures 12.3 x 11.5 x 11.2 cm. Echo texture is heterogenous with evidence of one large focal mass. Within  the uterus is one suspected  fibroid measuring: 10.6 x 9.9 x 6.4 cm.  The Endometrium measures 3.5 mm.  Right Ovary is not visualized. Left Ovary is not visualized. Survey of the adnexa demonstrates no adnexal masses. There is no free fluid in the cul de sac.  Assessment:    1. Menorrhagia 2. Obesity 3. H/o hypothyroidism 4. H/o seizure disorder 5. Fibroid uterus   Plan:   1. Menorrhagia with large fibroid uterus - attempted endometrial biopsy today, however patient unable to tolerate pelvic exam. Recommend D&C in operating room to assess uterine cavity (as patient with risk factors for hyperplasia, including obesity and abnormal bleeding). Risks of procedure discussed. Will schedule for Hysteroscopy D&C in 3 weeks. Counseling: Procedure, risks, reasons, benefits and complications (including injury to bowel, bladder, bleeding, possibility of transfusion, infection, or fistula formation) reviewed in detail. Consent signed. Preop testing ordered. nstructions reviewed, including NPO after midnight.  Further discussed management options for patient regarding abnormal bleeding with large fibroid, including Discussed management options for abnormal uterine bleeding including tranexamic acid (Lysteda), Depo Provera, Lupron, hysterectomy as definitive surgical management. Discussed risks and benefits of each method. Patient unsure of desires at this point. To discuss further after D&C. Is currently on combined OCPs (although not helping). Printed patient education handouts were given to the patient to review at home.  Can perform pap smear at time of procedure.  2. H/o hypothyroidism - TSH elevated but T4 wnl. Notes compliance with medications.  3. H/o seizure disorder. No h/o recent seizures.  Notes compliance with seizure medications.    Rubie Maid, MD [facility]

## 2014-12-19 NOTE — Discharge Instructions (Addendum)
AMBULATORY SURGERY  DISCHARGE INSTRUCTIONS   1) The drugs that you were given will stay in your system until tomorrow so for the next 24 hours you should not:  A) Drive an automobile B) Make any legal decisions C) Drink any alcoholic beverage   2) You may resume regular meals tomorrow.  Today it is better to start with liquids and gradually work up to solid foods.  You may eat anything you prefer, but it is better to start with liquids, then soup and crackers, and gradually work up to solid foods.   3) Please notify your doctor immediately if you have any unusual bleeding, trouble breathing, redness and pain at the surgery site, drainage, fever, or pain not relieved by medication.    4) Additional Instructions:   Please contact your physician with any problems or Same Day Surgery at [phone], Monday through Friday 6 am to 4 pm, or [place] at [facility] number at [phone].   Dilation and Curettage or Vacuum Curettage, Care After Refer to this sheet in the next few weeks. These instructions provide you with information on caring for yourself after your procedure. Your health care provider may also give you more specific instructions. Your treatment has been planned according to current medical practices, but problems sometimes occur. Call your health care provider if you have any problems or questions after your procedure. WHAT TO EXPECT AFTER THE PROCEDURE After your procedure, it is typical to have light cramping and bleeding. This may last for 2 days to 2 weeks after the procedure. HOME CARE INSTRUCTIONS   Do not drive for 24 hours.  Wait 1 week before returning to strenuous activities.  Take your temperature 2 times a day for 4 days and write it down. Provide these temperatures to your health care provider if you develop a fever.  Avoid long periods of standing.  Avoid heavy lifting, pushing, or pulling. Do not lift anything heavier than 10 pounds (4.5  kg).  Limit stair climbing to once or twice a day.  Take rest periods often.  You may resume your usual diet.  Drink enough fluids to keep your urine clear or pale yellow.  Your usual bowel function should return. If you have constipation, you may:  Take a mild laxative with permission from your health care provider.  Add fruit and bran to your diet.  Drink more fluids.  Take showers instead of baths until your health care provider gives you permission to take baths.  Do not go swimming or use a hot tub until your health care provider approves.  Try to have someone with you or available to you the first 24-48 hours, especially if you were given a general anesthetic.  Do not douche, use tampons, or have sex (intercourse) for 2 weeks after the procedure.  Only take over-the-counter or prescription medicines as directed by your health care provider. Do not take aspirin. It can cause bleeding.  Follow up with your health care provider as directed. SEEK MEDICAL CARE IF:   You have increasing cramps or pain that is not relieved with medicine.  You have abdominal pain that does not seem to be related to the same area of earlier cramping and pain.  You have bad smelling vaginal discharge.  You have a rash.  You are having problems with any medicine. SEEK IMMEDIATE MEDICAL CARE IF:   You have bleeding that is heavier than a normal menstrual period.  You have a fever.  You have chest  pain.  You have shortness of breath.  You feel dizzy or feel like fainting.  You pass out.  You have pain in your shoulder strap area.  You have heavy vaginal bleeding with or without blood clots. MAKE SURE YOU:   Understand these instructions.  Will watch your condition.  Will get help right away if you are not doing well or get worse. Document Released: 03/15/2000 Document Revised: 03/23/2013 Document Reviewed: 10/15/2012 [facility] Patient Information 2015 [place], [place]. This  information is not intended to replace advice given to you by your health care provider. Make sure you discuss any questions you have with your health care provider.

## 2014-12-19 NOTE — Anesthesia Preprocedure Evaluation (Signed)
Anesthesia Evaluation  Patient identified by MRN, date of birth, ID band Patient awake    Reviewed: Allergy & Precautions, H&P , NPO status , Patient's Chart, lab work & pertinent test results, reviewed documented beta blocker date and time   History of Anesthesia Complications Negative for: history of anesthetic complications  Airway Mallampati: III  TM Distance: >3 FB Neck ROM: full    Dental no notable dental hx. (+) Partial Upper, Poor Dentition   Pulmonary neg shortness of breath, sleep apnea , neg COPD, Recent URI , Resolved,    Pulmonary exam normal breath sounds clear to auscultation       Cardiovascular Exercise Tolerance: Good negative cardio ROS Normal cardiovascular exam Rhythm:regular Rate:Normal     Neuro/Psych Seizures -, Well Controlled,  PSYCHIATRIC DISORDERS (Depression, anxiety)    GI/Hepatic Neg liver ROS, hiatal hernia, GERD  Medicated and Controlled,  Endo/Other  neg diabetesHypothyroidism Morbid obesity  Renal/GU negative Renal ROS  negative genitourinary   Musculoskeletal   Abdominal   Peds  Hematology negative hematology ROS (+)   Anesthesia Other Findings Past Medical History:   Depression                                                   Thyroid disease                                              Anxiety                                                      Sleep apnea                                                  Esophagitis                                                    Comment:LA Class A   Hiatal hernia                                                Pseudoseizures                                               Obesity  Bipolar disorder                                             GERD (gastroesophageal reflux disease)                       Hypothyroidism                                               Anemia                                           2016         Seizures                                                       Comment:according to echart- seizures vs pseudoseizures   Seizure                                         2014           Comment:last seizure 2(two) years ago   Reproductive/Obstetrics negative OB ROS                             Anesthesia Physical Anesthesia Plan  ASA: III  Anesthesia Plan: General   Post-op Pain Management:    Induction:   Airway Management Planned:   Additional Equipment:   Intra-op Plan:   Post-operative Plan:   Informed Consent: I have reviewed the patients History and Physical, chart, labs and discussed the procedure including the risks, benefits and alternatives for the proposed anesthesia with the patient or authorized representative who has indicated his/her understanding and acceptance.   Dental Advisory Given  Plan Discussed with: Anesthesiologist, CRNA and Surgeon  Anesthesia Plan Comments:         Anesthesia Quick Evaluation

## 2014-12-20 ENCOUNTER — Encounter: Payer: Self-pay | Admitting: Obstetrics and Gynecology

## 2014-12-20 LAB — SURGICAL PATHOLOGY

## 2014-12-20 NOTE — Anesthesia Postprocedure Evaluation (Signed)
  Anesthesia Post-op Note  Patient: Nancy Hall  Procedure(s) Performed: Procedure(s): DILATATION AND CURETTAGE /HYSTEROSCOPY (N/A)  Anesthesia type:General  Patient location: PACU  Post pain: Pain level controlled  Post assessment: Post-op Vital signs reviewed, Patient's Cardiovascular Status Stable, Respiratory Function Stable, Patent Airway and No signs of Nausea or vomiting  Post vital signs: Reviewed and stable  Last Vitals:  Filed Vitals:   12/19/14 1200  BP: 127/85  Pulse: 72  Temp:   Resp: 16    Level of consciousness: awake, alert  and patient cooperative  Complications: No apparent anesthesia complications

## 2015-01-04 ENCOUNTER — Encounter: Payer: Self-pay | Admitting: Obstetrics and Gynecology

## 2015-01-04 ENCOUNTER — Ambulatory Visit (INDEPENDENT_AMBULATORY_CARE_PROVIDER_SITE_OTHER): Payer: Medicare Other | Admitting: Obstetrics and Gynecology

## 2015-01-04 VITALS — BP 108/74 | HR 80 | Ht 66.0 in | Wt 274.8 lb

## 2015-01-04 DIAGNOSIS — D259 Leiomyoma of uterus, unspecified: Secondary | ICD-10-CM

## 2015-01-04 DIAGNOSIS — Z9889 Other specified postprocedural states: Secondary | ICD-10-CM | POA: Diagnosis not present

## 2015-01-04 DIAGNOSIS — N920 Excessive and frequent menstruation with regular cycle: Secondary | ICD-10-CM

## 2015-01-04 DIAGNOSIS — F819 Developmental disorder of scholastic skills, unspecified: Secondary | ICD-10-CM

## 2015-01-04 DIAGNOSIS — R6250 Unspecified lack of expected normal physiological development in childhood: Secondary | ICD-10-CM

## 2015-01-04 NOTE — Progress Notes (Addendum)
GYNECOLOGY CLINIC PROGRESS NOTE  Subjective:     Nancy Hall is a 44 y.o. 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 Eating a regular diet without difficulty. Bowel movements are normal. Pain is controlled without any medications.  Thinks that she began her menstrual cycle several days after the procedure, still with spotting x ~1 week, followed by heavy bleeding for 5-6 days.   The following portions of the patient's history were reviewed and updated as appropriate: allergies, current medications, past family history, past medical history, past social history, past surgical history and problem list.  Review of Systems Pertinent items noted in HPI and remainder of comprehensive ROS otherwise negative.    Objective:    BP 108/74 mmHg  Pulse 80  Ht 5\' 6"  (1.676 m)  Wt 274 lb 12.8 oz (124.648 kg)  BMI 44.37 kg/m2  LMP 12/23/2014 (Exact Date) General:  alert and no distress, obese  Abdomen: soft, bowel sounds active, non-tender  Incision:   None        Pathology 12/19/2014:  A. ENDOMETRIUM; CURETTAGE:  - BENIGN CERVICAL TISSUE.  - ENDOMETRIAL TISSUE IS NOT IDENTIFIED.  - NEGATIVE FOR DYSPLASIA AND MALIGNANCY  Assessment:    Doing well postoperatively.  Menorrhagia Fibroid uterus   Plan:    1. Continue any current medications. 2. Operative findings again reviewed. Pathology report discussed. 3. Activity restrictions: none 4. Discussion had again regarding next steps for management of AUB.  Management options for abnormal uterine bleeding include tranexamic acid (Lysteda), oral progesterone, Depo Provera,  endometrial ablation (Novasure/Hydrothermal Ablation) or hysterectomy as definitive surgical management.  Mirena IUD may not be a viable option due to size of uterine fibroid and possibility of cavity distortion over  time. Also discussed Lupron for shrinking fibroid size and halting menstrual cycles for a time. Discussed risks and benefits of each method.   Patient desires definitive management with hysterectomy.  Printed patient education handouts were given to the patient to review at home.  The risks of surgery were discussed in detail with the patient including but not limited to: bleeding which may require transfusion or reoperation; infection which may require prolonged hospitalization or re-hospitalization and antibiotic therapy; injury to bowel, bladder, ureters and major vessels or other surrounding organs; need for additional procedures including laparotomy; thromboembolic phenomenon, incisional problems and other postoperative or anesthesia complications.  Patient was told that the likelihood that her condition and symptoms will be treated effectively with this surgical management was very high; the postoperative expectations were also discussed in detail. Also discussed opportunistic salpingectomy for risk reduction of peritoneal/tubal/ovarian cancers. Will have ovarian preservation. All questions were answered.  Patient desires procedure to be done as soon as possible.   Will schedule for TAH/bilateral salpingectomy for 01/09/2015.  5. Pathology without endometrial tissue from Hysteroscopy D&C.  Patient with risk factor of obesity for abnormal bleeding.  Hysteroscopy with no abnormalities noted.  Advised on repeating D&C just prior to hysterectomy to assess endometrial tissue.   A total of 15 minutes were spent face-to-face with the patient during this encounter and over half of that time dealt with counseling and coordination of care.  Rubie Maid, MD [facility] 01/04/2015 8:56 PM

## 2015-01-04 NOTE — H&P (Addendum)
OB/GYN PREOPERATIVE HISTORY AND PHYSICAL  Subjective:    Patient is a 44 y.o. G0P0 female scheduled for Total Abdominal Hysterectomy with Bilateral Salpingectomy. Indications for procedure are menorrhagia x 2 years, failed medical management (OCPs), declines further medical management and desires definitive surgical management, and mild mental delay.  Pertinent Gynecological History:  Menses: flow is excessive with use of 6-8 pads or tampons on heaviest days and regular every 28 days without intermenstrual spotting Bleeding: heavy menstrual bleeding Contraception: OCP (estrogen/progesterone).  Has never been sexually active.  Last mammogram: normal  Last pap: normal Date: 07/2011  Discussed Blood/Blood Products: no   Menstrual History: OB History    Gravida Para Term Preterm AB TAB SAB Ectopic Multiple Living   0 0 0 0 0 0 0 0 0 0       Menarche age: 89  Patient's last menstrual period was 12/23/2014 (exact date).    Past Medical History  Diagnosis Date  . Depression   . Thyroid disease   . Anxiety   . Sleep apnea   . Esophagitis     LA Class A  . Hiatal hernia   . Pseudoseizures   . Obesity   . Bipolar disorder ([place])   . GERD (gastroesophageal reflux disease)   . Hypothyroidism   . Anemia 2016  . Seizures ([place])     according to echart- seizures vs pseudoseizures  . Seizure ([place]) 2014    last seizure 2(two) years ago    Past Surgical History  Procedure Laterality Date  . Inner ear surgery Bilateral 1994    poor historian  . Tonsillectomy    . Hysteroscopy w/d&c N/A 12/19/2014    Procedure: DILATATION AND CURETTAGE /HYSTEROSCOPY;  Surgeon: Rubie Maid, MD;  Location: [facility] ORS;  Service: Gynecology;  Laterality: N/A;    Social History   Social History  . Marital Status: Single    Spouse Name: N/A  . Number of Children: 0  . Years of Education: 10   Social History Main Topics  . Smoking status: Never Smoker   . Smokeless tobacco: Never Used  . Alcohol  Use: No  . Drug Use: No  . Sexual Activity: No   Other Topics Concern  . None   Social History Narrative   Patient lives at home with mom.    Patient does not have any children.    Patient has a 10th grade education.    Patient is single.    Patient is left handed.    Does not have a living will or HPOA- full code.    Family History  Problem Relation Age of Onset  . Colon polyps Mother   . Celiac disease Mother   . Diabetes Paternal Grandmother   . Heart disease Paternal Grandfather   . Colon polyps Father   . Colon polyps Paternal Aunt   . Celiac disease Brother   . Diabetes Maternal Aunt   . Heart disease Maternal Grandfather   . Heart disease Maternal Uncle   . Heart disease Maternal Aunt   . Diabetes Sister   . Seizures Neg Hx     Allergies  Allergen Reactions  . Penicillins Rash    Review of Systems Constitutional: No recent fever/chills/sweats Respiratory: No recent cough/bronchitis Cardiovascular: No chest pain Gastrointestinal: No recent nausea/vomiting/diarrhea Genitourinary: No UTI symptoms Hematologic/lymphatic:No history of coagulopathy or recent blood thinner use    Objective:    BP 108/74 mmHg  Pulse 80  Ht 5\' 6"  (1.676 m)  Wt 274  lb 12.8 oz (124.648 kg)  BMI 44.37 kg/m2  LMP 12/19/2014 (Exact Date)  General:   Normal, obese  Skin:   normal  HEENT:  Normal  Neck:  Supple without Adenopathy or Thyromegaly  Lungs:   Heart:              Breasts:   Abdomen:  Pelvis:  M/S   Extremeties:  Neuro:  Psych:   clear to auscultation bilaterally   Normal without murmur   Not Examined   soft, non-tender; bowel sounds normal; no masses,  no organomegaly   Exam deferred to OR, but noted narrow pelvis during Hysteroscopy.  No CVAT  Warm/Dry   Normal  Mild mental delay        Labs:  Lab Results  Component Value Date   TSH 11.180* 11/03/2014   Lab Results  Component Value Date   WBC 6.0 12/07/2014   HGB 13.0 12/07/2014   HCT 39.1  12/07/2014   MCV 96.5 12/07/2014   PLT 145* 12/07/2014    Imaging:  11/08/2014 Pelvic Ultrasound:  Findings: Transvaginal approach was attempted, however, not successful. Patient was unable to tolerate the probe. The uterus measures 12.3 x 11.5 x 11.2 cm. Echo texture is heterogenous with evidence of one large focal mass. Within  the uterus is one suspected fibroid measuring: 10.6 x 9.9 x 6.4 cm.  The Endometrium measures 3.5 mm.  Right Ovary is not visualized. Left Ovary is not visualized. Survey of the adnexa demonstrates no adnexal masses. There is no free fluid in the cul de sac.  Pathology 12/19/2014:  A. ENDOMETRIUM; CURETTAGE:  - BENIGN CERVICAL TISSUE.  - ENDOMETRIAL TISSUE IS NOT IDENTIFIED.  - NEGATIVE FOR DYSPLASIA AND MALIGNANCY.  Assessment:    1. Menorrhagia 2. Obesity 3. H/o hypothyroidism 4. H/o seizure disorder 5. Fibroid uterus 7. Mild mental delay   Plan:   1. Discussion had again regarding next steps for management of AUB. Management options for abnormal uterine bleeding include tranexamic acid (Lysteda), oral progesterone, Depo Provera, endometrial ablation (Novasure/Hydrothermal Ablation) or hysterectomy as definitive surgical management. Mirena IUD may not be a viable option due to size of uterine fibroid and possibility of cavity distortion over time. Also discussed Lupron for shrinking fibroid size and halting menstrual cycles for a time. Discussed risks and benefits of each method. Patient desires definitive management with hysterectomy. Printed patient education handouts were given to the patient to review at home. The risks of surgery were discussed in detail with the patient including but not limited to: bleeding which may require transfusion or reoperation; infection which may require prolonged hospitalization or re-hospitalization and antibiotic therapy; injury to bowel, bladder, ureters and major vessels or other surrounding organs; need for  additional procedures including laparotomy; thromboembolic phenomenon, incisional problems and other postoperative or anesthesia complications. Patient was told that the likelihood that her condition and symptoms will be treated effectively with this surgical management was very high; the postoperative expectations were also discussed in detail. Also discussed opportunistic salpingectomy for risk reduction of peritoneal/tubal/ovarian cancers. Will have ovarian preservation. All questions were answered. Patient desires procedure to be done as soon as possible. Will schedule for TAH/bilateral salpingectomy for 01/09/2015.  2. Pathology without endometrial tissue from Hysteroscopy D&C.  Patient with risk factor of obesity for abnormal bleeding.  Hysteroscopy with no abnormalities noted.  Advised on repeating D&C just prior to hysterectomy to assess endometrial tissue and r/o malignancy.  3.H/o hypothyroidism - TSH elevated but T4 wnl. Notes compliance with  medications.  3. H/o seizure disorder (vs pseudoseizures). No h/o recent seizures in past 2 years.  Notes compliance with seizure medications.    Rubie Maid, MD [facility]

## 2015-01-04 NOTE — Patient Instructions (Signed)
She was told that she will be contacted by our surgical scheduler regarding the time and date of her surgery; routine preoperative instructions of having nothing to eat or drink after midnight on the day prior to surgery and also coming to the hospital 1.5 hours prior to her time of surgery were also emphasized.  She was told she may be called for a preoperative appointment about a week prior to surgery and will be given further preoperative instructions at that visit. Printed patient education handouts about the procedure were given to the patient to review at home.   Abdominal Hysterectomy Abdominal hysterectomy is a surgical procedure to remove your womb (uterus). Your uterus is the muscular organ that contains a developing baby. This surgery is done for many reasons. You may need an abdominal hysterectomy if you have cancer, growths (tumors), long-term pain, or bleeding. You may also have this procedure if your uterus has slipped down into your vagina (uterine prolapse). Depending on why you need an abdominal hysterectomy, you may also have other reproductive organs removed. These could include the part of your vagina that connects with your uterus (cervix), the organs that make eggs (ovaries), and the tubes that connect the ovaries to the uterus (fallopian tubes). LET [facility] CARE PROVIDER KNOW ABOUT:   Any allergies you have.  All medicines you are taking, including vitamins, herbs, eye drops, creams, and over-the-counter medicines.  Previous problems you or members of your family have had with the use of anesthetics.  Any blood disorders you have.  Previous surgeries you have had.  Medical conditions you have. RISKS AND COMPLICATIONS Generally, this is a safe procedure. However, as with any procedure, problems can occur. Infection is the most common problem after an abdominal hysterectomy. Other possible problems include:  Bleeding.  Formation of blood clots that may break free and  travel to your lungs.  Injury to other organs near your uterus.  Nerve injury causing nerve pain.  Decreased interest in sex or pain during sexual intercourse. BEFORE THE PROCEDURE  Abdominal hysterectomy is a major surgical procedure. It can affect the way you feel about yourself. Talk to your health care provider about the physical and emotional changes hysterectomy may cause.  You may need to have blood work and X-rays done before surgery.  Quit smoking if you smoke. Ask your health care provider for help if you are struggling to quit.  Stop taking medicines that thin your blood as directed by your health care provider.  You may be instructed to take antibiotic medicines or laxatives before surgery.  Do not eat or drink anything for 6-8 hours before surgery.  Take your regular medicines with a small sip of water.  Bathe or shower the night or morning before surgery. PROCEDURE  Abdominal hysterectomy is done in the operating room at the hospital.  In most cases, you will be given a medicine that makes you go to sleep (general anesthetic).  The surgeon will make a cut (incision) through the skin in your lower belly.  The incision may be about 5-7 inches long. It may go side-to-side or up-and-down.  The surgeon will move aside the body tissue that covers your uterus. The surgeon will then carefully take out your uterus along with any of your other reproductive organs that need to be removed.  Bleeding will be controlled with clamps or sutures.  The surgeon will close your incision with sutures or metal clips. AFTER THE PROCEDURE  You will have some pain  immediately after the procedure.  You will be given pain medicine in the recovery room.  You will be taken to your hospital room when you have recovered from the anesthesia.  You may need to stay in the hospital for 2-5 days.  You will be given instructions for recovery at home.   This information is not intended to  replace advice given to you by your health care provider. Make sure you discuss any questions you have with your health care provider.   Document Released: 03/23/2013 Document Reviewed: 03/23/2013 Elsevier Interactive Patient Education [employer].

## 2015-01-05 NOTE — Telephone Encounter (Signed)
pts mom (DPR signed) request status of omeprazole refill; spoke with Danae Chen at [facility] and has rx on hold but will get ready for pick up. Mrs Aina advised and voiced understanding.

## 2015-01-06 ENCOUNTER — Other Ambulatory Visit: Payer: Self-pay | Admitting: Obstetrics and Gynecology

## 2015-01-06 ENCOUNTER — Encounter
Admission: RE | Admit: 2015-01-06 | Discharge: 2015-01-06 | Disposition: A | Discharge status: Home / Self Care | Payer: Medicare Other | Source: Ambulatory Visit | Attending: Obstetrics and Gynecology | Admitting: Obstetrics and Gynecology

## 2015-01-06 HISTORY — DX: Developmental disorder of scholastic skills, unspecified: F81.9

## 2015-01-06 LAB — CBC
HCT: 37.3 % (ref 35.0–47.0)
HEMOGLOBIN: 12.4 g/dL (ref 12.0–16.0)
MCH: 32.1 pg (ref 26.0–34.0)
MCHC: 33.2 g/dL (ref 32.0–36.0)
MCV: 96.8 fL (ref 80.0–100.0)
Platelets: 164 10*3/uL (ref 150–440)
RBC: 3.86 MIL/uL (ref 3.80–5.20)
RDW: 13.6 % (ref 11.5–14.5)
WBC: 6.2 10*3/uL (ref 3.6–11.0)

## 2015-01-06 LAB — BASIC METABOLIC PANEL
ANION GAP: 7 (ref 5–15)
BUN: 9 mg/dL (ref 6–20)
CALCIUM: 8.7 mg/dL — AB (ref 8.9–10.3)
CHLORIDE: 110 mmol/L (ref 101–111)
CO2: 23 mmol/L (ref 22–32)
CREATININE: 0.85 mg/dL (ref 0.44–1.00)
GFR calc non Af Amer: >60 mL/min (ref >60)
GLUCOSE: 101 mg/dL — AB (ref 65–99)
Potassium: 4 mmol/L (ref 3.5–5.1)
Sodium: 140 mmol/L (ref 135–145)

## 2015-01-06 LAB — TYPE AND SCREEN
ABO/RH(D): O NEG
Antibody Screen: NEGATIVE

## 2015-01-06 LAB — ABO/RH: ABO/RH(D): O NEG

## 2015-01-06 NOTE — Patient Instructions (Signed)
  Your procedure is scheduled on: January 09, 2015(Monday) Report to Day Surgery. To find out your arrival time please call [phone] between 1PM - 3PM on January 06, 2015 (Friday).  Remember: Instructions that are not followed completely may result in serious medical risk, up to and including death, or upon the discretion of your surgeon and anesthesiologist your surgery may need to be rescheduled.    __x__ 1. Do not eat food or drink liquids after midnight. No gum chewing or hard candies.     ____ 2. No Alcohol for 24 hours before or after surgery.   ____ 3. Bring all medications with you on the day of surgery if instructed.    __x__ 4. Notify your doctor if there is any change in your medical condition     (cold, fever, infections).     Do not wear jewelry, make-up, hairpins, clips or nail polish.  Do not wear lotions, powders, or perfumes. You may wear deodorant.  Do not shave 48 hours prior to surgery. Men may shave face and neck.  Do not bring valuables to the hospital.    [facility] is not responsible for any belongings or valuables.               Contacts, dentures or bridgework may not be worn into surgery.  Leave your suitcase in the car. After surgery it may be brought to your room.  For patients admitted to the hospital, discharge time is determined by your                treatment team.   Patients discharged the day of surgery will not be allowed to drive home.   Please read over the following fact sheets that you were given:   Surgical Site Infection Prevention   ____ Take these medicines the morning of surgery with A SIP OF WATER:    1. Omeprazole  2. Lamotrigine    3Topamax.  ____ Fleet Enema (as directed)   _x__ Use CHG Soap as directed  ____ Use inhalers on the day of surgery  ____ Stop metformin 2 days prior to surgery    ____ Take 1/2 of usual insulin dose the night before surgery and none on the morning of surgery.   ____ Stop  Coumadin/Plavix/aspirin on   __x__ Stop Anti-inflammatories on (Stop Ibuprofen now) Tylenol ok to take for pain if needed   ____ Stop supplements until after surgery.    ____ Bring C-Pap to the hospital.

## 2015-01-08 ENCOUNTER — Other Ambulatory Visit: Payer: Self-pay | Admitting: Family Medicine

## 2015-01-09 ENCOUNTER — Ambulatory Visit: Payer: Medicare Other | Admitting: Gastroenterology

## 2015-01-09 ENCOUNTER — Inpatient Hospital Stay
Admission: RE | Admit: 2015-01-09 | Discharge: 2015-01-11 | DRG: 742 | Disposition: A | Discharge status: Home / Self Care | Payer: Medicare Other | Source: Ambulatory Visit | Attending: Obstetrics and Gynecology | Admitting: Obstetrics and Gynecology

## 2015-01-09 ENCOUNTER — Inpatient Hospital Stay: Payer: Medicare Other | Admitting: Anesthesiology

## 2015-01-09 ENCOUNTER — Encounter: Payer: Self-pay | Admitting: *Deleted

## 2015-01-09 ENCOUNTER — Encounter
Admission: RE | Disposition: A | Discharge status: Home / Self Care | Payer: Self-pay | Source: Ambulatory Visit | Attending: Obstetrics and Gynecology

## 2015-01-09 DIAGNOSIS — F419 Anxiety disorder, unspecified: Secondary | ICD-10-CM | POA: Diagnosis present

## 2015-01-09 DIAGNOSIS — R6250 Unspecified lack of expected normal physiological development in childhood: Secondary | ICD-10-CM | POA: Diagnosis not present

## 2015-01-09 DIAGNOSIS — N939 Abnormal uterine and vaginal bleeding, unspecified: Secondary | ICD-10-CM | POA: Diagnosis not present

## 2015-01-09 DIAGNOSIS — E039 Hypothyroidism, unspecified: Secondary | ICD-10-CM | POA: Diagnosis present

## 2015-01-09 DIAGNOSIS — D259 Leiomyoma of uterus, unspecified: Secondary | ICD-10-CM | POA: Diagnosis not present

## 2015-01-09 DIAGNOSIS — K219 Gastro-esophageal reflux disease without esophagitis: Secondary | ICD-10-CM | POA: Diagnosis present

## 2015-01-09 DIAGNOSIS — N840 Polyp of corpus uteri: Secondary | ICD-10-CM | POA: Diagnosis not present

## 2015-01-09 DIAGNOSIS — D649 Anemia, unspecified: Secondary | ICD-10-CM | POA: Diagnosis present

## 2015-01-09 DIAGNOSIS — F319 Bipolar disorder, unspecified: Secondary | ICD-10-CM | POA: Diagnosis present

## 2015-01-09 DIAGNOSIS — Z9889 Other specified postprocedural states: Secondary | ICD-10-CM

## 2015-01-09 DIAGNOSIS — R0681 Apnea, not elsewhere classified: Secondary | ICD-10-CM | POA: Diagnosis present

## 2015-01-09 DIAGNOSIS — N920 Excessive and frequent menstruation with regular cycle: Secondary | ICD-10-CM | POA: Diagnosis not present

## 2015-01-09 DIAGNOSIS — G4730 Sleep apnea, unspecified: Secondary | ICD-10-CM | POA: Diagnosis not present

## 2015-01-09 DIAGNOSIS — G40909 Epilepsy, unspecified, not intractable, without status epilepticus: Secondary | ICD-10-CM | POA: Diagnosis present

## 2015-01-09 DIAGNOSIS — Z6841 Body Mass Index (BMI) 40.0 and over, adult: Secondary | ICD-10-CM

## 2015-01-09 HISTORY — PX: ABDOMINAL HYSTERECTOMY: SHX81

## 2015-01-09 LAB — POCT PREGNANCY, URINE: PREG TEST UR: NEGATIVE

## 2015-01-09 SURGERY — HYSTERECTOMY, ABDOMINAL
Anesthesia: General

## 2015-01-09 MED ORDER — EPHEDRINE SULFATE 50 MG/ML IJ SOLN
INTRAMUSCULAR | Status: DC | PRN
  Administered 2015-01-09: 10 mg via INTRAVENOUS

## 2015-01-09 MED ORDER — FENTANYL CITRATE (PF) 100 MCG/2ML IJ SOLN
INTRAMUSCULAR | Status: DC | PRN
  Administered 2015-01-09 (×3): 50 ug via INTRAVENOUS
  Administered 2015-01-09: 100 ug via INTRAVENOUS

## 2015-01-09 MED ORDER — HYDROMORPHONE HCL 1 MG/ML IJ SOLN: 0.2000 mg | INTRAMUSCULAR | Status: DC | PRN

## 2015-01-09 MED ORDER — MIDAZOLAM HCL 2 MG/2ML IJ SOLN
INTRAMUSCULAR | Status: DC | PRN
  Administered 2015-01-09 (×2): 1 mg via INTRAVENOUS

## 2015-01-09 MED ORDER — LIDOCAINE HCL (CARDIAC) 20 MG/ML IV SOLN
INTRAVENOUS | Status: DC | PRN
  Administered 2015-01-09: 60 mg via INTRAVENOUS

## 2015-01-09 MED ORDER — DIPHENHYDRAMINE HCL 50 MG/ML IJ SOLN: 25.0000 mg | Freq: Four times a day (QID) | INTRAMUSCULAR | Status: DC | PRN

## 2015-01-09 MED ORDER — ONDANSETRON HCL 4 MG/2ML IJ SOLN
INTRAMUSCULAR | Status: DC | PRN
Start: 2015-01-09 — End: 2015-01-09
  Administered 2015-01-09: 4 mg via INTRAVENOUS

## 2015-01-09 MED ORDER — FERROUS GLUCONATE 324 (38 FE) MG PO TABS
324.0000 mg | ORAL_TABLET | Freq: Every day | ORAL | Status: DC
  Administered 2015-01-09 – 2015-01-11 (×3): 324 mg via ORAL
  Filled 2015-01-09 (×4): qty 1

## 2015-01-09 MED ORDER — DEXTROSE 5 % IV SOLN
INTRAVENOUS | Status: DC
  Filled 2015-01-09: qty 15.5

## 2015-01-09 MED ORDER — LIOTHYRONINE SODIUM 25 MCG PO TABS
25.0000 ug | ORAL_TABLET | Freq: Every day | ORAL | Status: DC
  Administered 2015-01-09 – 2015-01-11 (×3): 25 ug via ORAL
  Filled 2015-01-09 (×4): qty 1

## 2015-01-09 MED ORDER — LIDOCAINE 5 % EX PTCH
MEDICATED_PATCH | CUTANEOUS | Status: DC | PRN
  Administered 2015-01-09: 1 via TRANSDERMAL

## 2015-01-09 MED ORDER — TOPIRAMATE 100 MG PO TABS
100.0000 mg | ORAL_TABLET | Freq: Two times a day (BID) | ORAL | Status: DC
  Administered 2015-01-09 – 2015-01-11 (×4): 100 mg via ORAL
  Filled 2015-01-09 (×5): qty 1

## 2015-01-09 MED ORDER — MENTHOL 3 MG MT LOZG: 1.0000 | LOZENGE | OROMUCOSAL | Status: DC | PRN

## 2015-01-09 MED ORDER — FENTANYL CITRATE (PF) 100 MCG/2ML IJ SOLN
INTRAMUSCULAR | Status: AC
  Administered 2015-01-09: 25 ug via INTRAVENOUS
  Filled 2015-01-09: qty 2

## 2015-01-09 MED ORDER — CITRIC ACID-SODIUM CITRATE 334-500 MG/5ML PO SOLN
30.0000 mL | ORAL | Status: AC
  Administered 2015-01-09: 30 mL via ORAL
  Filled 2015-01-09 (×2): qty 30

## 2015-01-09 MED ORDER — BISACODYL 10 MG RE SUPP: 10.0000 mg | Freq: Every day | RECTAL | Status: DC | PRN

## 2015-01-09 MED ORDER — GENTAMICIN SULFATE 40 MG/ML IJ SOLN
186.4500 mg | INTRAVENOUS | Status: DC | PRN
  Administered 2015-01-09: 621.6 mg via INTRAVENOUS

## 2015-01-09 MED ORDER — FENTANYL CITRATE (PF) 100 MCG/2ML IJ SOLN
25.0000 ug | INTRAMUSCULAR | Status: DC | PRN
  Administered 2015-01-09 (×4): 25 ug via INTRAVENOUS

## 2015-01-09 MED ORDER — IBUPROFEN 800 MG PO TABS
800.0000 mg | ORAL_TABLET | Freq: Four times a day (QID) | ORAL | Status: DC | PRN
  Administered 2015-01-09 – 2015-01-11 (×6): 800 mg via ORAL
  Filled 2015-01-09 (×6): qty 1

## 2015-01-09 MED ORDER — ONDANSETRON HCL 4 MG/2ML IJ SOLN
4.0000 mg | Freq: Four times a day (QID) | INTRAMUSCULAR | Status: DC | PRN
  Administered 2015-01-10: 4 mg via INTRAVENOUS
  Filled 2015-01-09: qty 2

## 2015-01-09 MED ORDER — ALUM & MAG HYDROXIDE-SIMETH 200-200-20 MG/5ML PO SUSP: 30.0000 mL | ORAL | Status: DC | PRN

## 2015-01-09 MED ORDER — ONDANSETRON HCL 4 MG PO TABS: 4.0000 mg | ORAL_TABLET | Freq: Four times a day (QID) | ORAL | Status: DC | PRN

## 2015-01-09 MED ORDER — LACTATED RINGERS IV SOLN
INTRAVENOUS | Status: DC
  Administered 2015-01-09: 12:00:00 via INTRAVENOUS

## 2015-01-09 MED ORDER — LEVOTHYROXINE SODIUM 125 MCG PO TABS
250.0000 ug | ORAL_TABLET | Freq: Every day | ORAL | Status: DC
  Administered 2015-01-10 – 2015-01-11 (×2): 250 ug via ORAL
  Filled 2015-01-09 (×2): qty 2
  Filled 2015-01-09 (×2): qty 5

## 2015-01-09 MED ORDER — GLYCOPYRROLATE 0.2 MG/ML IJ SOLN
INTRAMUSCULAR | Status: DC | PRN
  Administered 2015-01-09: .8 mg via INTRAVENOUS
  Administered 2015-01-09: 0.2 mg via INTRAVENOUS

## 2015-01-09 MED ORDER — NEOSTIGMINE METHYLSULFATE 10 MG/10ML IV SOLN
INTRAVENOUS | Status: DC | PRN
  Administered 2015-01-09: 4 mg via INTRAVENOUS

## 2015-01-09 MED ORDER — PROPOFOL 10 MG/ML IV BOLUS
INTRAVENOUS | Status: DC | PRN
  Administered 2015-01-09: 150 mg via INTRAVENOUS

## 2015-01-09 MED ORDER — DIPHENHYDRAMINE HCL 25 MG PO CAPS: 25.0000 mg | ORAL_CAPSULE | Freq: Four times a day (QID) | ORAL | Status: DC | PRN

## 2015-01-09 MED ORDER — PANTOPRAZOLE SODIUM 40 MG PO TBEC
40.0000 mg | DELAYED_RELEASE_TABLET | Freq: Every day | ORAL | Status: DC
  Administered 2015-01-10 – 2015-01-11 (×2): 40 mg via ORAL
  Filled 2015-01-09 (×2): qty 1

## 2015-01-09 MED ORDER — LIDOCAINE 5 % EX PTCH
MEDICATED_PATCH | CUTANEOUS | Status: AC
  Filled 2015-01-09: qty 1

## 2015-01-09 MED ORDER — ZOLPIDEM TARTRATE 5 MG PO TABS: 5.0000 mg | ORAL_TABLET | Freq: Every evening | ORAL | Status: DC | PRN

## 2015-01-09 MED ORDER — SIMETHICONE 80 MG PO CHEW: 80.0000 mg | CHEWABLE_TABLET | Freq: Four times a day (QID) | ORAL | Status: DC | PRN

## 2015-01-09 MED ORDER — OXYCODONE-ACETAMINOPHEN 5-325 MG PO TABS
1.0000 | ORAL_TABLET | ORAL | Status: DC | PRN
  Administered 2015-01-11: 1 via ORAL
  Filled 2015-01-09: qty 1

## 2015-01-09 MED ORDER — ROCURONIUM BROMIDE 100 MG/10ML IV SOLN
INTRAVENOUS | Status: DC | PRN
  Administered 2015-01-09: 5 mg via INTRAVENOUS
  Administered 2015-01-09: 15 mg via INTRAVENOUS
  Administered 2015-01-09: 10 mg via INTRAVENOUS
  Administered 2015-01-09: 40 mg via INTRAVENOUS

## 2015-01-09 MED ORDER — DOCUSATE SODIUM 100 MG PO CAPS
100.0000 mg | ORAL_CAPSULE | Freq: Two times a day (BID) | ORAL | Status: DC
  Administered 2015-01-09 – 2015-01-11 (×4): 100 mg via ORAL
  Filled 2015-01-09 (×4): qty 1

## 2015-01-09 MED ORDER — ONDANSETRON HCL 4 MG/2ML IJ SOLN: 4.0000 mg | Freq: Once | INTRAMUSCULAR | Status: DC | PRN

## 2015-01-09 MED ORDER — ARIPIPRAZOLE 15 MG PO TABS
15.0000 mg | ORAL_TABLET | Freq: Every day | ORAL | Status: DC
  Administered 2015-01-09 – 2015-01-11 (×3): 15 mg via ORAL
  Filled 2015-01-09 (×4): qty 1

## 2015-01-09 MED ORDER — SERTRALINE HCL 100 MG PO TABS
100.0000 mg | ORAL_TABLET | Freq: Every day | ORAL | Status: DC
  Administered 2015-01-10 – 2015-01-11 (×2): 100 mg via ORAL
  Filled 2015-01-09 (×3): qty 1

## 2015-01-09 MED ORDER — LACTATED RINGERS IV SOLN
INTRAVENOUS | Status: DC
  Administered 2015-01-09: 22:00:00 via INTRAVENOUS

## 2015-01-09 MED ORDER — ACETAMINOPHEN-CODEINE #3 300-30 MG PO TABS
1.0000 | ORAL_TABLET | ORAL | Status: DC | PRN
  Administered 2015-01-09 – 2015-01-10 (×4): 1 via ORAL
  Administered 2015-01-10 (×2): 2 via ORAL
  Administered 2015-01-11: 1 via ORAL
  Filled 2015-01-09 (×2): qty 2
  Filled 2015-01-09 (×5): qty 1

## 2015-01-09 MED ORDER — LEVOTHYROXINE SODIUM 75 MCG PO TABS
75.0000 ug | ORAL_TABLET | Freq: Every day | ORAL | Status: DC
  Administered 2015-01-10 – 2015-01-11 (×2): 75 ug via ORAL
  Filled 2015-01-09: qty 1
  Filled 2015-01-09: qty 1.5
  Filled 2015-01-09: qty 1

## 2015-01-09 MED ORDER — CLINDAMYCIN PHOSPHATE 600 MG/50ML IV SOLN
INTRAVENOUS | Status: DC | PRN
  Administered 2015-01-09: 900 mg via INTRAVENOUS

## 2015-01-09 MED ORDER — LAMOTRIGINE 150 MG PO TABS
150.0000 mg | ORAL_TABLET | Freq: Two times a day (BID) | ORAL | Status: DC
  Administered 2015-01-09 – 2015-01-10 (×3): 150 mg via ORAL
  Filled 2015-01-09 (×6): qty 1

## 2015-01-09 MED ORDER — KETOROLAC TROMETHAMINE 30 MG/ML IJ SOLN
INTRAMUSCULAR | Status: AC
  Administered 2015-01-09: 30 mg via INTRAVENOUS
  Filled 2015-01-09: qty 1

## 2015-01-09 MED ORDER — KETOROLAC TROMETHAMINE 30 MG/ML IJ SOLN
30.0000 mg | Freq: Once | INTRAMUSCULAR | Status: AC
  Administered 2015-01-09: 30 mg via INTRAVENOUS

## 2015-01-09 MED ORDER — LIDOCAINE 5 % EX PTCH
1.0000 | MEDICATED_PATCH | CUTANEOUS | Status: DC
  Administered 2015-01-10: 1 via TRANSDERMAL
  Filled 2015-01-09 (×2): qty 1

## 2015-01-09 SURGICAL SUPPLY — 39 items
BAG COUNTER SPONGE EZ (MISCELLANEOUS) IMPLANT
CANISTER SUCT 1200ML W/VALVE (MISCELLANEOUS) ×3 IMPLANT
CATH FOLEY 2WAY  5CC 16FR (CATHETERS) ×2
CATH TRAY 16F METER LATEX (MISCELLANEOUS) ×3 IMPLANT
CATH URTH 16FR FL 2W BLN LF (CATHETERS) ×1 IMPLANT
CHLORAPREP W/TINT 26ML (MISCELLANEOUS) ×3 IMPLANT
COUNTER SPONGE BAG EZ (MISCELLANEOUS)
DRAPE LAPAROTOMY 100X77 ABD (DRAPES) IMPLANT
DRAPE LAPAROTOMY TRNSV 106X77 (MISCELLANEOUS) ×3 IMPLANT
DRSG OPSITE POSTOP 4X10 (GAUZE/BANDAGES/DRESSINGS) ×3 IMPLANT
DRSG TELFA 3X8 NADH (GAUZE/BANDAGES/DRESSINGS) ×3 IMPLANT
ELECT BLADE 6 FLAT ULTRCLN (ELECTRODE) ×3 IMPLANT
GAUZE SPONGE 4X4 12PLY STRL (GAUZE/BANDAGES/DRESSINGS) IMPLANT
GLOVE BIO SURGEON STRL SZ 6 (GLOVE) ×6 IMPLANT
GLOVE BIOGEL PI IND STRL 6.5 (GLOVE) ×2 IMPLANT
GLOVE BIOGEL PI INDICATOR 6.5 (GLOVE) ×4
GOWN STRL REUS W/ TWL LRG LVL3 (GOWN DISPOSABLE) ×3 IMPLANT
GOWN STRL REUS W/TWL LRG LVL3 (GOWN DISPOSABLE) ×6
HANDLE YANKAUER SUCT BULB TIP (MISCELLANEOUS) ×3 IMPLANT
KIT RM TURNOVER CYSTO AR (KITS) ×3 IMPLANT
LABEL OR SOLS (LABEL) IMPLANT
LIGASURE IMPACT 36 18CM CVD LR (INSTRUMENTS) ×3 IMPLANT
NS IRRIG 1000ML POUR BTL (IV SOLUTION) ×3 IMPLANT
PACK BASIN MAJOR ARMC (MISCELLANEOUS) ×3 IMPLANT
PAD GROUND ADULT SPLIT (MISCELLANEOUS) ×3 IMPLANT
RETRACTOR WND ALEXIS-O 25 LRG (MISCELLANEOUS) ×1 IMPLANT
RTRCTR WOUND ALEXIS O 25CM LRG (MISCELLANEOUS) ×3
SPONGE LAP 18X18 5 PK (GAUZE/BANDAGES/DRESSINGS) ×3 IMPLANT
STAPLER SKIN PROX 35W (STAPLE) ×3 IMPLANT
SUT CHROMIC 0 CT 1 (SUTURE) IMPLANT
SUT MAXON ABS #0 GS21 30IN (SUTURE) ×6 IMPLANT
SUT MNCRL 3 0 RB1 (SUTURE) ×1 IMPLANT
SUT MONOCRYL 3 0 RB1 (SUTURE) ×2
SUT VIC AB 0 CT1 27 (SUTURE) ×4
SUT VIC AB 0 CT1 27XCR 8 STRN (SUTURE) ×2 IMPLANT
SUT VIC AB 3-0 SH 27 (SUTURE) ×2
SUT VIC AB 3-0 SH 27X BRD (SUTURE) ×1 IMPLANT
SYR BULB IRRIG 60ML STRL (SYRINGE) IMPLANT
TRAY PREP VAG/GEN (MISCELLANEOUS) ×3 IMPLANT

## 2015-01-09 NOTE — H&P (Addendum)
UPDATE TO PREVIOUS HISTORY AND PHYSICAL  The patient has been seen and examined.  H&P is up to date, no changes noted.  Nancy Hall is a 44 y.o. G0P0 female scheduled for Total Abdominal Hysterectomy with Bilateral Salpingectomy. Indications for procedure are fibroid uterus, menorrhagia x 2 years, failed medical management (OCPs), declines further medical management and desires definitive surgical management, and mild mental delay.  All questions answered. Patient can proceed to the OR for scheduled procedure when ready.   Rubie Maid, MD 01/09/2015 11:02 AM

## 2015-01-09 NOTE — Op Note (Signed)
Hysterectomy Procedure Note  Indications: 44 y.o. P0 female with menorrhagia x 2 years, fibroid uterus, failed medical management (OCPs), declines further medical management and desires definitive surgical management, and mild mental delay  Pre-operative Diagnosis: Menorrhagia, fibroid uterus, mild mental delay  Post-operative Diagnosis: Same  Operation: Total abdominal hysterectomy with bilateral salpingectomy  Surgeon: Rubie Maid, MD  Assistants: Malachi Paradise, MD  Anesthesia: General endotracheal anesthesia  ASA Class: 3   Findings: EUA: Enlarged, bulky uterus, ~ 14-16 week size, mobile.  Narrow pelvis.  Laparotomy: Enlarged fibroid uterus, normal appearing fallopian tubes and ovaries.  Ureters identified bilaterally. Normal upper abdomen palpated.   Procedure Details  The patient was seen in the Holding Room. The risks, benefits, complications, treatment options, and expected outcomes were discussed with the patient.  The patient concurred with the proposed plan, giving informed consent.  The site of surgery properly noted/marked. The patient was taken to Operating Room # 5, identified as Nancy Hall and the procedure verified as Total abdominal hysterectomy, bilateral salpingectomy. A Time Out was held and the above information confirmed.  After induction of anesthesia, the patient was draped and prepped in the usual sterile manner. Pt was placed in supine position after anesthesia and draped and prepped in the usual sterile manner. Foley catheter was placed.  A Pfannensteil incision was made and carried through the subcutaneous tissue to the fascia. Fascial incision was made and extended laterally using Mayo scissors. The rectus muscles were separated. The peritoneum was identified and entered. Peritoneal incision was extended longitudinally.  The above findings were noted. An Alexis retractor was placed and bowel was packed away from the surgical site.   The round  ligaments were identified, cut, and ligated with the Ligasure device. The anterior peritoneal reflection was incised and the bladder was dissected off the lower uterine segment. The retroperitoneal space was explored and the ureters were identified bilaterally. The right utero-ovarian ligament and proximal fallopian tube were grasped, cut and ligated with the Ligasure. The left utero-ovarian ligament and proximal fallopian tube were grasped, cut and ligated with the Ligasure.  Next the fallopian tubes were grasped with Babcock clamps bilaterally, and transected using the Ligasure.  Hemostasis was observed. The uterine vessels were skeletonized, then clamped, cut and ligated with the Ligasure. Serial pedicles of the cardinal and utero-sacral ligaments were clamped, cut and ligated with the Ligasure. The cervix was amputated above the internal cervical os and the large fibroid uterus was removed for better visualization.  The remainder of the utero-sacral ligaments were clamped, cut and ligated with the Ligasure. Entrance was made into the vagina and the cervical stump was removed. Vaginal cuff angle sutures were placed incorporating the utero-sacral ligaments for support. The vaginal cuff was then closed with a figure of eight stitches of 0- Vicryl. Lavage was carried out until clear. Hemostasis was observed.  Retractor and all packing was removed from the abdomen. The fascia was approximated with running sutures of 0-Maxon. Lavage was again carried out.  The subcutaneous fat layer was approximated using a running suture of 3-0 Vicryl. Hemostasis was observed. The skin was approximated with 4-0 Monocryl.  Instrument, sponge, and needle counts were correct prior to abdominal closure and at the conclusion of the case.   Estimated Blood Loss:  150 ml         Drains: Foley catheter with 550 ml of clear urine at end of procedure.          Total IV Fluids: 1000 ml  Specimens: Uterus, cervix, bilateral  fallopian tubes         Implants: None         Complications:  None; patient tolerated the procedure well.         Disposition: PACU - hemodynamically stable.         Condition: stable   SIGNED:  Rubie Maid, MD [facility]

## 2015-01-09 NOTE — Transfer of Care (Signed)
Immediate Anesthesia Transfer of Care Note  Patient: [name]  Procedure(s) Performed: Procedure(s): HYSTERECTOMY ABDOMINAL/BILATERAL SALPINGECTOMY (N/A)  Patient Location: PACU  Anesthesia Type:General  Level of Consciousness: awake, alert  and oriented  Airway & Oxygen Therapy: Patient Spontanous Breathing and Patient connected to face mask oxygen  Post-op Assessment: Report given to RN and Post -op Vital signs reviewed and stable  Post vital signs: Reviewed and stable  Last Vitals:  Filed Vitals:   01/09/15 1357  BP: 133/60  Pulse: 86  Temp: 37.3 C  Resp: 20    Complications: No apparent anesthesia complications

## 2015-01-09 NOTE — Anesthesia Procedure Notes (Signed)
Procedure Name: Intubation Date/Time: 01/09/2015 11:52 AM Performed by: [facility] Pre-anesthesia Checklist: Patient identified, Emergency Drugs available, Suction available, Patient being monitored and Timeout performed Patient Re-evaluated:Patient Re-evaluated prior to inductionOxygen Delivery Method: Circle system utilized Preoxygenation: Pre-oxygenation with 100% oxygen Intubation Type: IV induction Ventilation: Mask ventilation without difficulty Laryngoscope Size: Mac and 3 Grade View: Grade I Tube type: Oral Tube size: 7.0 mm Number of attempts: 1 Airway Equipment and Method: Stylet and Patient positioned with wedge pillow Placement Confirmation: ETT inserted through vocal cords under direct vision,  positive ETCO2,  CO2 detector and breath sounds checked- equal and bilateral Secured at: 21 cm Tube secured with: Tape Dental Injury: Teeth and Oropharynx as per pre-operative assessment

## 2015-01-09 NOTE — Anesthesia Preprocedure Evaluation (Addendum)
Anesthesia Evaluation  Patient identified by MRN, date of birth, ID band Patient awake    Reviewed: Allergy & Precautions, H&P , NPO status , Patient's Chart, lab work & pertinent test results, reviewed documented beta blocker date and time   History of Anesthesia Complications Negative for: history of anesthetic complications  Airway Mallampati: III  TM Distance: >3 FB Neck ROM: full    Dental no notable dental hx. (+) Partial Upper, Poor Dentition   Pulmonary neg shortness of breath, sleep apnea , neg COPD, Recent URI , Resolved,    Pulmonary exam normal breath sounds clear to auscultation       Cardiovascular Exercise Tolerance: Good negative cardio ROS Normal cardiovascular exam Rhythm:regular Rate:Normal     Neuro/Psych Seizures -, Well Controlled,  PSYCHIATRIC DISORDERS (Depression, anxiety) Anxiety Depression Bipolar Disorder    GI/Hepatic Neg liver ROS, hiatal hernia, GERD  Medicated and Controlled,  Endo/Other  neg diabetesHypothyroidism Morbid obesity  Renal/GU negative Renal ROS  negative genitourinary   Musculoskeletal negative musculoskeletal ROS (+)   Abdominal   Peds negative pediatric ROS (+)  Hematology negative hematology ROS (+) anemia ,   Anesthesia Other Findings Past Medical History:   Depression                                                   Thyroid disease                                              Anxiety                                                      Sleep apnea                                                  Esophagitis                                                    Comment:LA Class A   Hiatal hernia                                                Pseudoseizures                                               Obesity  Bipolar disorder                                             GERD (gastroesophageal reflux disease)                        Hypothyroidism                                               Anemia                                          2016         Seizures                                                       Comment:according to echart- seizures vs pseudoseizures   Seizure                                         2014           Comment:last seizure 2(two) years ago   Reproductive/Obstetrics negative OB ROS                             Anesthesia Physical Anesthesia Plan  ASA: III  Anesthesia Plan: General   Post-op Pain Management:    Induction: Intravenous  Airway Management Planned: Oral ETT  Additional Equipment:   Intra-op Plan:   Post-operative Plan: Extubation in OR  Informed Consent: I have reviewed the patients History and Physical, chart, labs and discussed the procedure including the risks, benefits and alternatives for the proposed anesthesia with the patient or authorized representative who has indicated his/her understanding and acceptance.   Dental advisory given  Plan Discussed with: CRNA and Surgeon  Anesthesia Plan Comments:        Anesthesia Quick Evaluation

## 2015-01-10 LAB — CBC
HCT: 32.8 % — ABNORMAL LOW (ref 35.0–47.0)
HEMOGLOBIN: 11 g/dL — AB (ref 12.0–16.0)
MCH: 32.5 pg (ref 26.0–34.0)
MCHC: 33.5 g/dL (ref 32.0–36.0)
MCV: 97.2 fL (ref 80.0–100.0)
PLATELETS: 137 10*3/uL — AB (ref 150–440)
RBC: 3.38 MIL/uL — AB (ref 3.80–5.20)
RDW: 13.6 % (ref 11.5–14.5)
WBC: 6.7 10*3/uL (ref 3.6–11.0)

## 2015-01-10 LAB — CREATININE, SERUM: CREATININE: 0.88 mg/dL (ref 0.44–1.00)

## 2015-01-10 NOTE — Anesthesia Postprocedure Evaluation (Signed)
  Anesthesia Post-op Note  Patient: Nancy Hall  Procedure(s) Performed: Procedure(s): HYSTERECTOMY ABDOMINAL/BILATERAL SALPINGECTOMY (N/A)  Anesthesia type:General  Patient location: PACU  Post pain: Pain level controlled  Post assessment: Post-op Vital signs reviewed, Patient's Cardiovascular Status Stable, Respiratory Function Stable, Patent Airway and No signs of Nausea or vomiting  Post vital signs: Reviewed and stable  Last Vitals:  Filed Vitals:   01/10/15 1614  BP: 130/81  Pulse: 86  Temp: 36.7 C  Resp: 18    Level of consciousness: awake, alert  and patient cooperative  Complications: No apparent anesthesia complications

## 2015-01-10 NOTE — Care Management (Deleted)
Admitted to [facility] with the diagnosis of sepsis. Lives with girlfriend, Garfield Cornea [phone]). Primary care physician is Dr. Ronnald Ramp in [place], not been in her office in over a year. Last seen Dr. Jeb Levering 01/02/15. Lymphoma and chemotherapy. No home health. No skilled facility. No home oxygen. Takes care of both basic and instrumental activities of daily living, drives. No falls. Fair appetite.  Girlfriend will transport. Shelbie Ammons RN MSN Care Management [phone]

## 2015-01-10 NOTE — Care Management Important Message (Signed)
Important Message  Patient Details  Name: Nancy Hall MRN: [id number] Date of Birth: 09-03-1970   Medicare Important Message Given:  Yes-second notification given    Shelbie Ammons, RN 01/10/2015, 11:39 AM

## 2015-01-10 NOTE — Progress Notes (Signed)
1 Day Post-Op Procedure(s) (LRB): HYSTERECTOMY ABDOMINAL/BILATERAL SALPINGECTOMY (N/A)  Subjective: Patient denies complaints.  Notes pain is controlled with PO pain meds.  Has not yet ambulated.  Is tolerating liquid diet.   Objective: I have reviewed patient's vital signs, intake and output, medications and labs.  Blood pressure 108/53, pulse 80, temperature 98.5 F (36.9 C), temperature source Oral, resp. rate 18, height 5\' 6"  (1.676 m), weight 274 lb (124.286 kg), last menstrual period 12/19/2014, SpO2 97 %.  General: alert and no distress Resp: clear to auscultation bilaterally Cardio: regular rate and rhythm, S1, S2 normal, no murmur, click, rub or gallop GI: normal findings: bowel sounds normal and soft, appropriately tender and nondistended.  Incision: honeycomb dressing with dried blood.  Incision currently dry, no drainiage or erythema.  Extremities: extremities normal, atraumatic, no cyanosis or edema and Homans sign is negative, no sign of DVT Vaginal Bleeding: minimal   Lab Review:  CBC Latest Ref Rng 01/10/2015 01/06/2015  WBC 3.6 - 11.0 K/uL 6.7 6.2  Hemoglobin 12.0 - 16.0 g/dL 11.0(L) 12.4  Hematocrit 35.0 - 47.0 % 32.8(L) 37.3  Platelets 150 - 440 K/uL 137(L) 164    Lab Results  Component Value Date   CREATININE 0.88 01/10/2015   CREATININE 0.85 01/06/2015    Assessment: s/p Procedure(s): HYSTERECTOMY ABDOMINAL/BILATERAL SALPINGECTOMY (N/A): stable, progressing well and tolerating diet  Plan: Advance diet Encourage ambulation Continue PO medication Encourage use of incentive spirometer. Discontinue IV fluids and foley catheter. Dispo: d/c home in 1-2 days.    LOS: 1 day    Rubie Maid 01/10/2015, 8:31 AM

## 2015-01-11 ENCOUNTER — Telehealth: Payer: Self-pay | Admitting: Obstetrics and Gynecology

## 2015-01-11 LAB — SURGICAL PATHOLOGY

## 2015-01-11 MED ORDER — OXYCODONE-ACETAMINOPHEN 5-325 MG PO TABS: 1.0000 | ORAL_TABLET | Freq: Four times a day (QID) | ORAL | Status: DC | PRN

## 2015-01-11 MED ORDER — IBUPROFEN 800 MG PO TABS: 800.0000 mg | ORAL_TABLET | Freq: Three times a day (TID) | ORAL | Status: DC | PRN

## 2015-01-11 MED ORDER — DOCUSATE SODIUM 100 MG PO CAPS: 100.0000 mg | ORAL_CAPSULE | Freq: Two times a day (BID) | ORAL | Status: DC | PRN

## 2015-01-11 NOTE — Discharge Instructions (Signed)
General Gynecological Post-Operative Instructions You may expect to feel dizzy, weak, and drowsy for as long as 24 hours after receiving the medicine that made you sleep (anesthetic).  Do not drive a car, ride a bicycle, participate in physical activities, or take public transportation until you are done taking narcotic pain medicines or as directed by your doctor.  Do not drink alcohol or take tranquilizers.  Do not take medicine that has not been prescribed by your doctor.  Do not sign important papers or make important decisions while on narcotic pain medicines.  Have a responsible person with you.  CARE OF INCISION  Keep incision clean and dry. Take showers instead of baths until your doctor gives you permission to take baths.  Avoid heavy lifting (more than 10 pounds/4.5 kilograms), pushing, or pulling.  Avoid activities that may risk injury to your surgical site.  No sexual intercourse or placement of anything in the vagina for 6 weeks or as instructed by your doctor. Only take prescription or over-the-counter medicines  for pain, discomfort, or fever as directed by your doctor. Do not take aspirin. It can make you bleed. Take medicines (antibiotics) that kill germs if they are prescribed for you.  Call the office or go to the Emergency Room if:  You feel sick to your stomach (nauseous).  You start to throw up (vomit).  You have trouble eating or drinking.  You have an oral temperature above 101.  You have constipation that is not helped by adjusting diet or increasing fluid intake. Pain medicines are a common cause of constipation.  You have any other concerns. SEEK IMMEDIATE MEDICAL CARE IF:  You have persistent dizziness.  You have difficulty breathing or a congested sounding (croupy) cough.  You have an oral temperature above 102.5, not controlled by medicine.  There is increasing pain or tenderness near or in the surgical site.

## 2015-01-11 NOTE — Progress Notes (Signed)
2 Days Post-Op Procedure(s) (LRB): HYSTERECTOMY ABDOMINAL/BILATERAL SALPINGECTOMY (N/A)  Subjective: Patient denies complaints.  Notes pain is controlled with PO pain meds.  Is ambulating and tolerating regular diet.  Voiding without difficulty.  Passing flatus.   Objective: I have reviewed patient's vital signs, intake and output, medications and labs.  Blood pressure 108/60, pulse 91, temperature 98.3 F (36.8 C), temperature source Oral, resp. rate 18, height 5\' 6"  (1.676 m), weight 274 lb (124.286 kg), last menstrual period 12/19/2014, SpO2 94 %.  General: alert and no distress Resp: clear to auscultation bilaterally Cardio: regular rate and rhythm, S1, S2 normal, no murmur, click, rub or gallop GI: normal findings: bowel sounds normal and soft, appropriately tender and nondistended.  Incision: honeycomb dressing with old dried blood.  Incision currently dry, no drainiage or erythema.  Extremities: extremities normal, atraumatic, no cyanosis or edema and Homans sign is negative, no sign of DVT Vaginal Bleeding: minimal   Lab Review:  CBC Latest Ref Rng 01/10/2015 01/06/2015  WBC 3.6 - 11.0 K/uL 6.7 6.2  Hemoglobin 12.0 - 16.0 g/dL 11.0(L) 12.4  Hematocrit 35.0 - 47.0 % 32.8(L) 37.3  Platelets 150 - 440 K/uL 137(L) 164    Lab Results  Component Value Date   CREATININE 0.88 01/10/2015   CREATININE 0.85 01/06/2015    Assessment: s/p Procedure(s): HYSTERECTOMY ABDOMINAL/BILATERAL SALPINGECTOMY (N/A): stable, progressing well and tolerating diet  Plan: Continue routine post-op care.   Patient desires to go home today.  Can d/c home.  To f/u in 1 week for incision check.     LOS: 2 days    Rubie Maid 01/11/2015, 9:43 AM

## 2015-01-11 NOTE — Discharge Summary (Signed)
Gynecology Physician Postoperative Discharge Summary  Patient ID: Nancy Hall MRN: [id number] DOB/AGE: Sep 17, 1970 44 y.o.  Admit Date: 01/09/2015 Discharge Date: 01/11/2015  Preoperative Diagnoses: Menorrhagia, fibroid uterus, mild mental delay  Procedures: Procedure(s) (LRB): HYSTERECTOMY ABDOMINAL/BILATERAL SALPINGECTOMY (N/A)  Hospital Course:  Nancy Hall is a 44 y.o. G0P0000  admitted for scheduled surgery.  She underwent the procedures as mentioned above, her operation was uncomplicated. For further details about surgery, please refer to the operative report. Patient had an uncomplicated postoperative course. By time of discharge on POD#2, her pain was controlled on oral pain medications; she was ambulating, voiding without difficulty, tolerating regular diet and passing flatus. She was deemed stable for discharge to home.   Significant Labs: CBC Latest Ref Rng 01/10/2015 01/06/2015 12/07/2014  WBC 3.6 - 11.0 K/uL 6.7 6.2 6.0  Hemoglobin 12.0 - 16.0 g/dL 11.0(L) 12.4 13.0  Hematocrit 35.0 - 47.0 % 32.8(L) 37.3 39.1  Platelets 150 - 440 K/uL 137(L) 164 145(L)    Discharge Exam: Blood pressure 108/60, pulse 91, temperature 98.3 F (36.8 C), temperature source Oral, resp. rate 18, height 5\' 6"  (1.676 m), weight 274 lb (124.286 kg), last menstrual period 12/19/2014, SpO2 94 %. General appearance: alert and no distress  Resp: clear to auscultation bilaterally  Cardio: regular rate and rhythm  GI: soft, non-tender; bowel sounds normal; no masses, no organomegaly.  Incision: C/D/I, no erythema, no drainage noted Pelvic: scant blood on pad  Extremities: extremities normal, atraumatic, no cyanosis or edema and Homans sign is negative, no sign of DVT  Discharged Condition: Stable  Disposition: 01-Home or Self Care     Medication List    STOP taking these medications        acetaminophen-codeine 300-30 MG tablet  Commonly known as:  TYLENOL #3     ferrous gluconate 240  (27 FE) MG tablet  Commonly known as:  FERGON      TAKE these medications        ARIPiprazole 15 MG tablet  Commonly known as:  ABILIFY  TAKE 1 TABLET BY MOUTH EVERY DAY     docusate sodium 100 MG capsule  Commonly known as:  COLACE  Take 1 capsule (100 mg total) by mouth 2 (two) times daily as needed for mild constipation.     ibuprofen 800 MG tablet  Commonly known as:  ADVIL,MOTRIN  Take 1 tablet (800 mg total) by mouth every 8 (eight) hours as needed.     lamoTRIgine 150 MG tablet  Commonly known as:  LAMICTAL  TAKE 1 TABLET BY MOUTH 2 TIMES DAILY.     levothyroxine 125 MCG tablet  Commonly known as:  SYNTHROID, LEVOTHROID  TAKE 2 TABLETS BY MOUTH EVERY DAY     levothyroxine 75 MCG tablet  Commonly known as:  SYNTHROID, LEVOTHROID  TAKE 1 TABLET BY MOUTH EVERY DAY     liothyronine 25 MCG tablet  Commonly known as:  CYTOMEL  TAKE 1 TABLET BY MOUTH EVERY DAY     omeprazole 40 MG capsule  Commonly known as:  PRILOSEC  TAKE 1 CAPSULE (40 MG TOTAL) BY MOUTH DAILY.     oxyCODONE-acetaminophen 5-325 MG tablet  Commonly known as:  PERCOCET/ROXICET  Take 1-2 tablets by mouth every 6 (six) hours as needed for severe pain (moderate to severe pain (when tolerating fluids)).     sertraline 100 MG tablet  Commonly known as:  ZOLOFT  TAKE ONE TABLET BY MOUTH DAILY     topiramate 100 MG tablet  Commonly known as:  TOPAMAX  TAKE 1 TABLET IN THE MORNING AND 1 & 1/2 TABLETS AT NIGHT           Follow-up Information    Follow up with Rubie Maid, MD In 1 week.   Specialties:  Obstetrics and Gynecology, Radiology   Why:  For wound re-check   Contact information:   [place] [address] [phone]       Signed: Rubie Maid, MD [facility] 01/11/2015 9:52 AM

## 2015-01-11 NOTE — Telephone Encounter (Signed)
[name] mom called to make a 1 wk post op appt but there is nothing available next wk right now. I wanted to let you know and if something cancels I will call her. She has a 2 wk post op on the 10/26 already.

## 2015-01-11 NOTE — Progress Notes (Signed)
Discharge instructions complete. Prescriptions given to patient. Patient discharged to home at 1140.

## 2015-01-12 ENCOUNTER — Other Ambulatory Visit: Payer: Self-pay | Admitting: Family Medicine

## 2015-01-12 NOTE — Telephone Encounter (Signed)
Ok.  Yes, I would like for her to have a 1 week, so if anyone cancels we can move her.  Otherwise, she can keep her 10/26 appointment.

## 2015-01-12 NOTE — Telephone Encounter (Signed)
Yes Ma'am. °

## 2015-01-25 ENCOUNTER — Ambulatory Visit (INDEPENDENT_AMBULATORY_CARE_PROVIDER_SITE_OTHER): Payer: Medicare Other | Admitting: Obstetrics and Gynecology

## 2015-01-25 ENCOUNTER — Encounter: Payer: Self-pay | Admitting: Obstetrics and Gynecology

## 2015-01-25 VITALS — BP 115/81 | HR 80 | Resp 14 | Ht 66.0 in | Wt 271.2 lb

## 2015-01-25 DIAGNOSIS — Z9889 Other specified postprocedural states: Secondary | ICD-10-CM

## 2015-01-25 DIAGNOSIS — Z90710 Acquired absence of both cervix and uterus: Secondary | ICD-10-CM

## 2015-01-25 NOTE — Progress Notes (Signed)
GYNECOLOGY CLINIC PROGRESS NOTE  Subjective:     Nancy Hall is a 44 y.o. P0 female who presents to the clinic 2 weeks status post total abdominal hysterectomy  With bilateral salpingectomy for abnormal uterine bleeding and fibroids. Eating a regular diet without difficulty. Bowel movements are abnormal with occasional episodes of mild constipation. Pain is controlled with current analgesics. Medications being used: prescription NSAID's including ibuprofen (Motrin) and narcotic analgesics including oxycodone/acetaminophen (Percocet, Tylox).  The following portions of the patient's history were reviewed and updated as appropriate: allergies, current medications, past family history, past medical history, past social history, past surgical history and problem list.  Review of Systems Pertinent items noted in HPI and remainder of comprehensive ROS otherwise negative.    Objective:    BP 115/81 mmHg  Pulse 80  Resp 14  Ht 5\' 6"  (1.676 m)  Wt 271 lb 3.2 oz (123.016 kg)  BMI 43.79 kg/m2  LMP 12/19/2014 (Exact Date) General:  alert and no distress  Abdomen: soft, bowel sounds active, non-tender  Incision:   healing well, no drainage, no erythema, no hernia, no seroma, no swelling, no dehiscence, incision well approximated     Assessment:    Doing well postoperatively.   Plan:    1. Continue any current medications. 2. Wound care discussed. 3. Activity restrictions: no bending, stooping, or squatting, no lifting more than 15 pounds and pelvic rest x 4 weeks 4. Anticipated return to work: not applicable. 5. Follow up: 4 weeks for final post-op check and discussion of pathology.   Rubie Maid, MD [facility]

## 2015-01-28 ENCOUNTER — Other Ambulatory Visit: Payer: Self-pay | Admitting: Family Medicine

## 2015-02-10 ENCOUNTER — Ambulatory Visit: Payer: Medicare Other | Admitting: Gastroenterology

## 2015-02-12 ENCOUNTER — Other Ambulatory Visit: Payer: Self-pay | Admitting: Family Medicine

## 2015-02-12 IMAGING — MG MM SCREENING BREAST TOMO BILATERAL
8 series · 8 of 24 positions shown · non-contrast
Comparison: None.

CLINICAL DATA: Screening.

EXAM:
DIGITAL SCREENING BILATERAL MAMMOGRAM WITH 3D TOMO WITH CAD

[L CC]
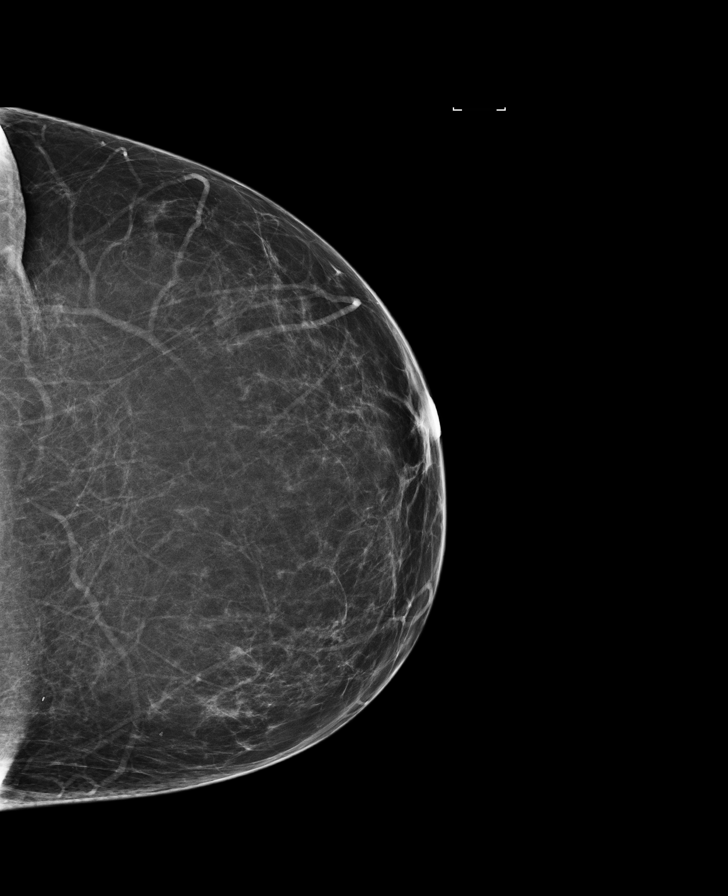

[R CC]
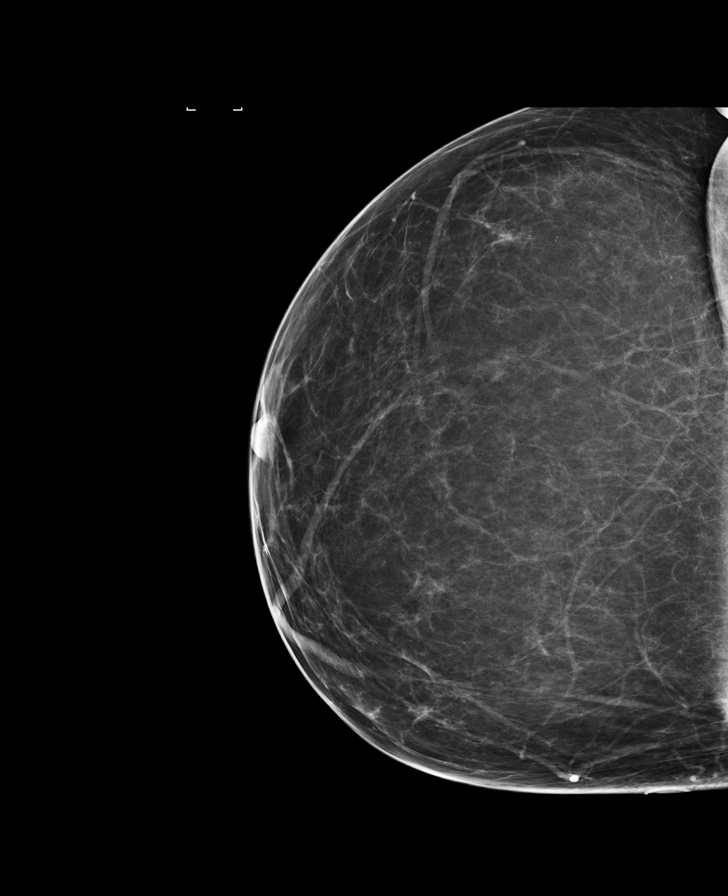

[R MLO]
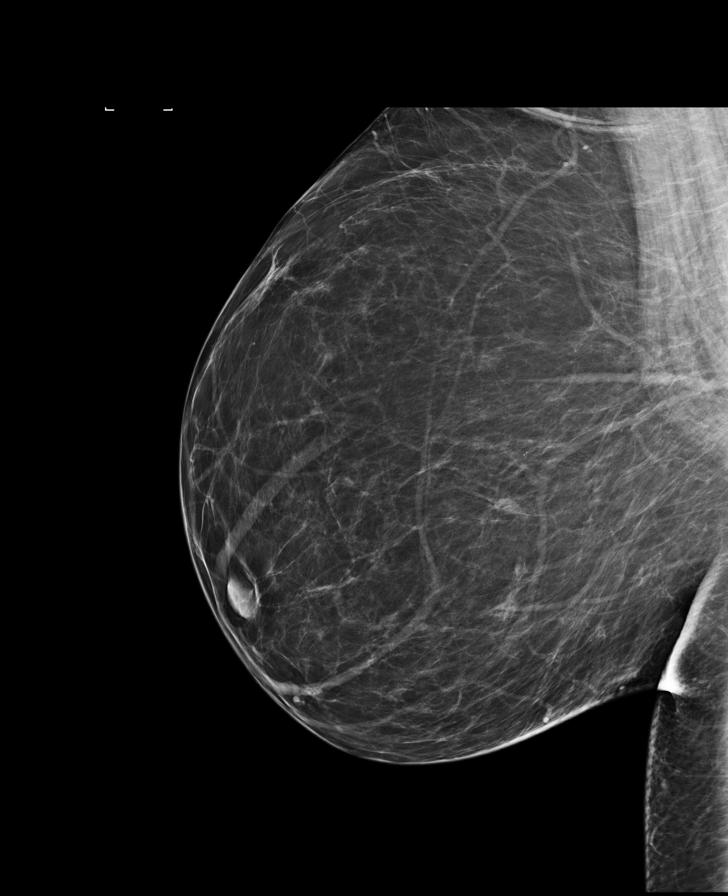

[L MLO]
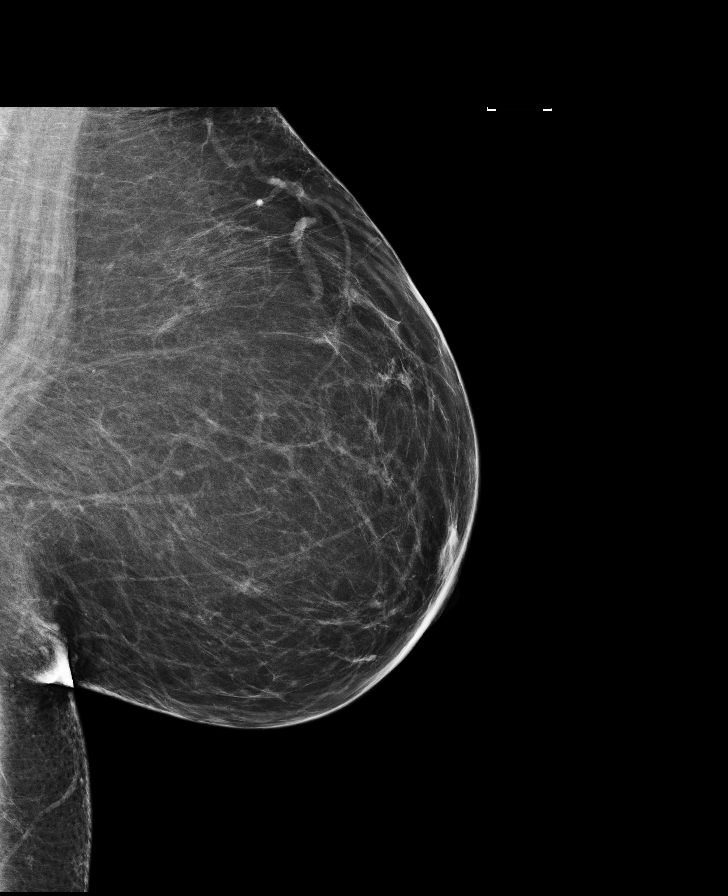

[R MLO tomo · tomo slice 49/97.0]
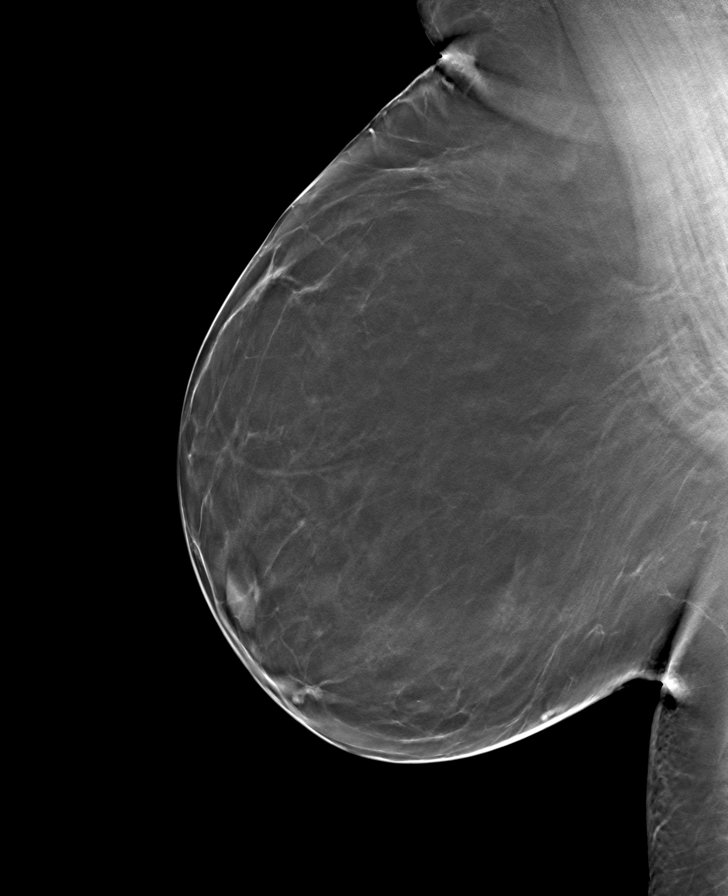

[L MLO tomo · tomo slice 48/95.0]
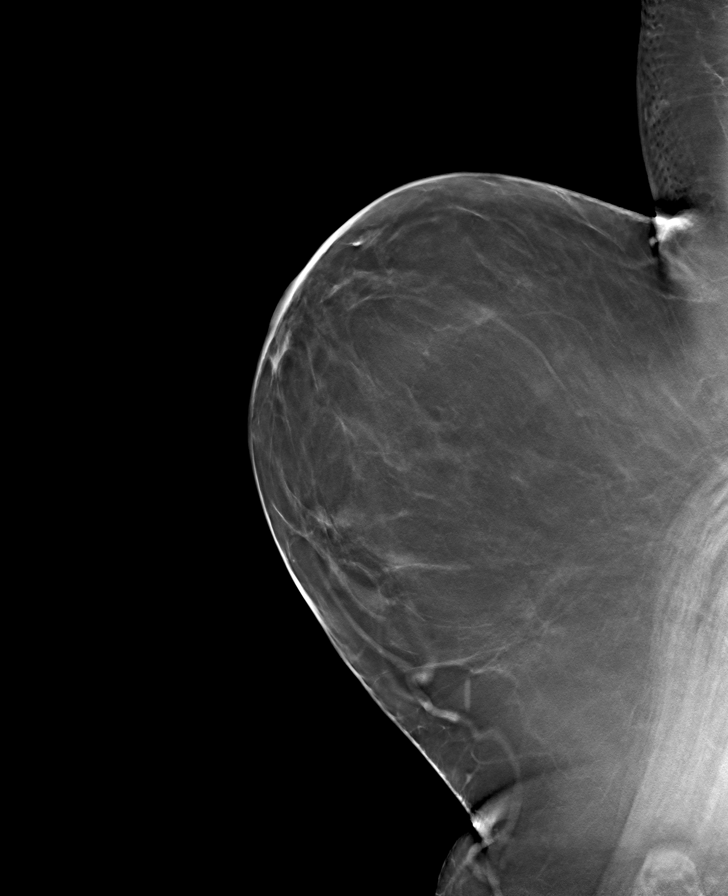

[L CC tomo · tomo slice 41/81.0]
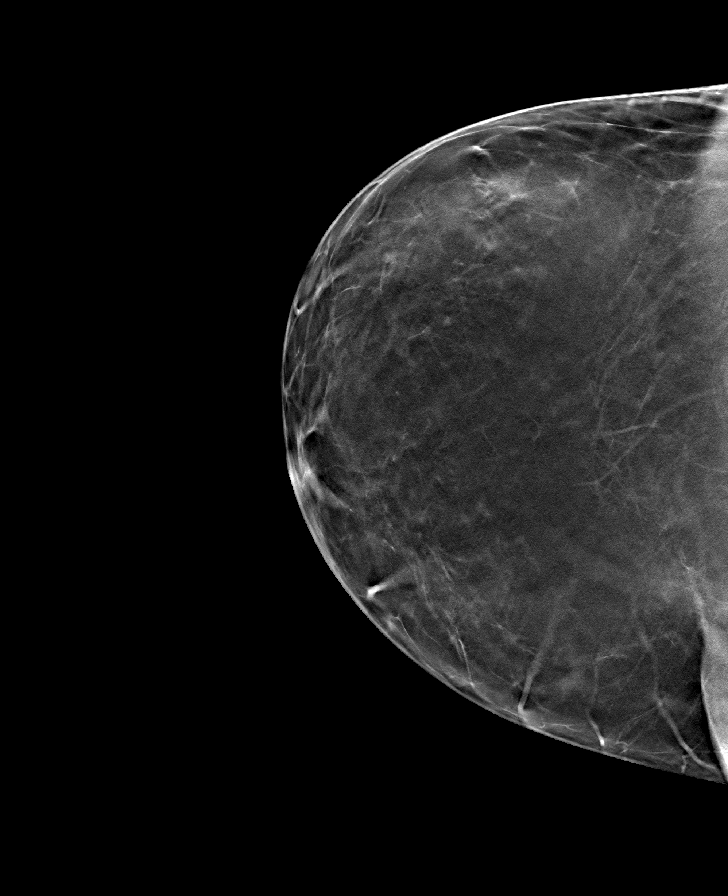

[R CC tomo · tomo slice 43/86.0]
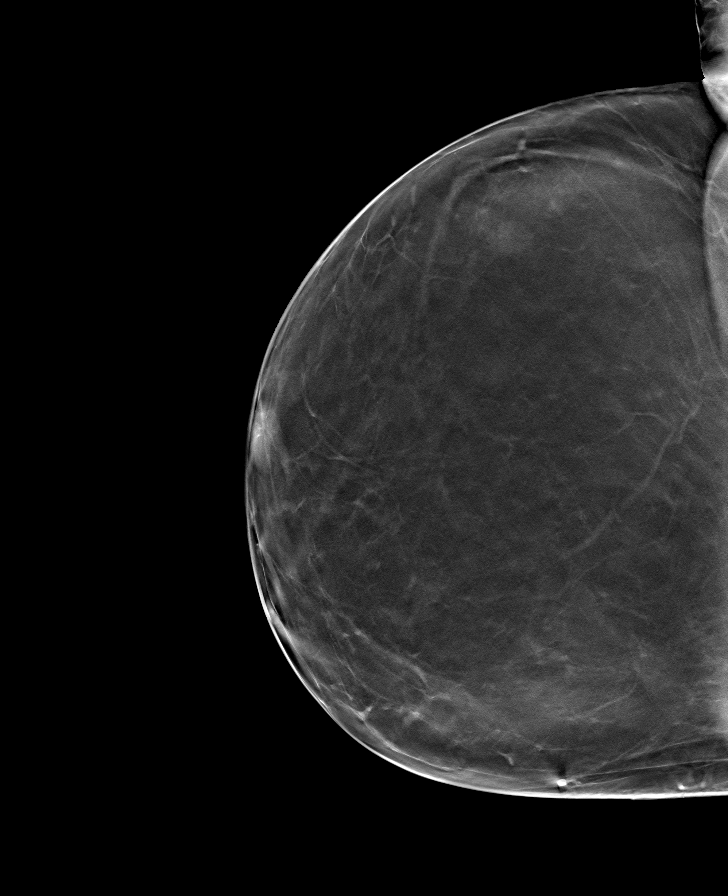

[8 of 24 positions shown; findings below may reference images not displayed]

ACR Breast Density Category b: There are scattered areas of
fibroglandular density.
FINDINGS: There are no findings suspicious for malignancy. Images were
processed with CAD.
IMPRESSION: No mammographic evidence of malignancy. A result letter of this
screening mammogram will be mailed directly to the patient.

RECOMMENDATION:
Screening mammogram in one year. (Code:2K-[id number])

BI-RADS CATEGORY  1: Negative.

## 2015-02-21 ENCOUNTER — Encounter: Payer: Self-pay | Admitting: Obstetrics and Gynecology

## 2015-02-21 ENCOUNTER — Ambulatory Visit (INDEPENDENT_AMBULATORY_CARE_PROVIDER_SITE_OTHER): Payer: Medicare Other | Admitting: Obstetrics and Gynecology

## 2015-02-21 VITALS — BP 106/74 | HR 82 | Ht 66.0 in | Wt 274.6 lb

## 2015-02-21 DIAGNOSIS — L989 Disorder of the skin and subcutaneous tissue, unspecified: Secondary | ICD-10-CM

## 2015-02-21 DIAGNOSIS — L293 Anogenital pruritus, unspecified: Secondary | ICD-10-CM

## 2015-02-21 MED ORDER — HYDROCORTISONE 0.5 % EX CREA: 1.0000 "application " | TOPICAL_CREAM | Freq: Two times a day (BID) | CUTANEOUS | Status: DC

## 2015-02-21 NOTE — Progress Notes (Signed)
GYNECOLOGY CLINIC PROGRESS NOTE  Subjective:     Nancy Hall is a 44 y.o. P0 female who presents to the clinic 4 weeks status post total abdominal hysterectomy  With bilateral salpingectomy for abnormal uterine bleeding and fibroids. Eating a regular diet without difficulty. Bowel movements are abnormal with occasional episodes of mild constipation. Pain is controlled with current analgesics. Medications being used: prescription NSAID's including ibuprofen (Motrin) and narcotic analgesics including oxycodone/acetaminophen (Percocet, Tylox).  The following portions of the patient's history were reviewed and updated as appropriate: allergies, current medications, past family history, past medical history, past social history, past surgical history and problem list.  Review of Systems A comprehensive review of systems was negative except for: Genitourinary: positive for vulvar itching , and does report vaginal discharge without internal itching or odor.    Objective:    BP 106/74 mmHg  Pulse 82  Ht 5\' 6"  (1.676 m)  Wt 274 lb 9.6 oz (124.558 kg)  BMI 44.34 kg/m2  LMP 12/19/2014 (Exact Date) General:  alert and no distress  Abdomen: soft, bowel sounds active, non-tender  Incision:   healing well, no drainage, no erythema, no hernia, no seroma, no swelling, no dehiscence, incision well approximated  Pelvis:  External genitalia normal without lesions.  Vagina with small amount of white thindischarge. Uterus and cervix surgically absent. Vaginal cuff well healed, nontender. Bimanual exam not done. Vulva and perineum with excoriations present, erythematous skin with patches of white thickened patches noted.        Microscopic wet-mount exam shows negative for pathogens, normal epithelial cells.  Few clue cells present.    01/09/2015 Pathology:  DIAGNOSIS:  A. UTERUS WITH CERVIX AND BILATERAL FALLOPIAN TUBES; TOTAL HYSTERECTOMY  WITH BILATERAL SALPINGECTOMY:  - CERVIX WITH CERVICITIS AND  SQUAMOUS METAPLASIA.  - ENDOMETRIAL POLYP, 2.0 CM.  - SECRETORY ENDOMETRIUM.  - MYOMETRIUM WITH INTRAMURAL LEIOMYOMATA, LARGEST MEASURING 6.3 CM.  - BILATERAL FALLOPIAN TUBES WITH BENIGN PARATUBAL CYSTS.  - NEGATIVE FOR ATYPIA AND MALIGNANCY.   Assessment:    Doing well postoperatively.  Vulvar/perineal  irititation   Plan:    1. Continue any current medications. 2. Wound care discussed. 3. Discussed pathology with patient.  4. Anticipated return to work: not applicable. 5. Hydrocortisone cream prescribed for vulvar irritation, apply BID x 1-2 weeks.  Follow up: 1 year, or sooner as needed.   Rubie Maid, MD [facility]

## 2015-03-01 ENCOUNTER — Other Ambulatory Visit: Payer: Self-pay | Admitting: Family Medicine

## 2015-04-05 ENCOUNTER — Other Ambulatory Visit: Payer: Self-pay | Admitting: *Deleted

## 2015-04-05 MED ORDER — OMEPRAZOLE 40 MG PO CPDR: DELAYED_RELEASE_CAPSULE | ORAL | Status: DC

## 2015-04-06 ENCOUNTER — Ambulatory Visit: Payer: Medicare Other | Admitting: Adult Health

## 2015-04-06 ENCOUNTER — Ambulatory Visit: Payer: Medicare Other | Admitting: Nurse Practitioner

## 2015-04-13 ENCOUNTER — Other Ambulatory Visit: Payer: Self-pay | Admitting: *Deleted

## 2015-04-13 MED ORDER — LEVOTHYROXINE SODIUM 75 MCG PO TABS: 75.0000 ug | ORAL_TABLET | Freq: Every day | ORAL | Status: DC

## 2015-04-13 MED ORDER — ARIPIPRAZOLE 15 MG PO TABS: 15.0000 mg | ORAL_TABLET | Freq: Every day | ORAL | Status: DC

## 2015-04-13 NOTE — Telephone Encounter (Signed)
Lm on pts mother's vm and informed her OV required for additional refills

## 2015-04-24 ENCOUNTER — Ambulatory Visit (INDEPENDENT_AMBULATORY_CARE_PROVIDER_SITE_OTHER): Payer: Medicare Other | Admitting: Family Medicine

## 2015-04-24 ENCOUNTER — Encounter: Payer: Self-pay | Admitting: Family Medicine

## 2015-04-24 VITALS — BP 132/72 | HR 70 | Temp 98.0°F | Wt 277.8 lb

## 2015-04-24 DIAGNOSIS — E039 Hypothyroidism, unspecified: Secondary | ICD-10-CM

## 2015-04-24 DIAGNOSIS — Z23 Encounter for immunization: Secondary | ICD-10-CM

## 2015-04-24 LAB — T3, FREE: T3 FREE: 3.1 pg/mL (ref 2.3–4.2)

## 2015-04-24 LAB — T4, FREE: FREE T4: 0.5 ng/dL — AB (ref 0.60–1.60)

## 2015-04-24 LAB — TSH: TSH: 7.12 u[IU]/mL — ABNORMAL HIGH (ref 0.35–4.50)

## 2015-04-24 MED ORDER — LEVOTHYROXINE SODIUM 75 MCG PO TABS: 75.0000 ug | ORAL_TABLET | Freq: Every day | ORAL | Status: DC

## 2015-04-24 NOTE — Progress Notes (Signed)
Pre visit review using our clinic review tool, if applicable. No additional management support is needed unless otherwise documented below in the visit note. 

## 2015-04-24 NOTE — Progress Notes (Signed)
Subjective:   Patient ID: Nancy Hall, female    DOB: Oct 25, 1970, 45 y.o.   MRN: [id number]  Nancy Hall is a pleasant 45 y.o. year old female who presents to clinic today with her sister for Follow-up  on 04/24/2015  HPI:  Hypothyroidism- currently taking synthroid to 325 mcg daily in addition to Cytomel 25 mcg daily.  Energy improved- walking more.  Has lost 5 pounds.   Has noticed no worsening of her occasional tremor, insomnia, anxiety or diarrhea.  Lab Results  Component Value Date   TSH 11.180* 11/03/2014   Current Outpatient Prescriptions on File Prior to Visit  Medication Sig Dispense Refill  . ARIPiprazole (ABILIFY) 15 MG tablet Take 1 tablet (15 mg total) by mouth daily. OFFICE VISIT REQUIRED FOR ADDITIONAL REFILLS 30 tablet 0  . docusate sodium (COLACE) 100 MG capsule Take 1 capsule (100 mg total) by mouth 2 (two) times daily as needed for mild constipation. 60 capsule 1  . hydrocortisone cream 0.5 % Apply 1 application topically 2 (two) times daily. 30 g 0  . ibuprofen (ADVIL,MOTRIN) 800 MG tablet Take 1 tablet (800 mg total) by mouth every 8 (eight) hours as needed. 60 tablet 1  . lamoTRIgine (LAMICTAL) 150 MG tablet TAKE 1 TABLET BY MOUTH 2 TIMES DAILY. 60 tablet 3  . liothyronine (CYTOMEL) 25 MCG tablet TAKE 1 TABLET BY MOUTH EVERY DAY 30 tablet 3  . omeprazole (PRILOSEC) 40 MG capsule TAKE 1 CAPSULE (40 MG TOTAL) BY MOUTH DAILY. 90 capsule 1  . oxyCODONE-acetaminophen (PERCOCET/ROXICET) 5-325 MG tablet Take 1-2 tablets by mouth every 6 (six) hours as needed for severe pain (moderate to severe pain (when tolerating fluids)). 30 tablet 0  . sertraline (ZOLOFT) 100 MG tablet TAKE ONE TABLET BY MOUTH DAILY 30 tablet 2  . topiramate (TOPAMAX) 100 MG tablet TAKE 1 TABLET IN THE MORNING AND 1 & 1/2 TABLETS AT NIGHT 225 tablet 2   No current facility-administered medications on file prior to visit.    Allergies  Allergen Reactions  . Penicillins Rash    Past Medical  History  Diagnosis Date  . Depression   . Thyroid disease   . Anxiety   . Sleep apnea   . Esophagitis     LA Class A  . Hiatal hernia   . Pseudoseizures   . Obesity   . Bipolar disorder ([place])   . GERD (gastroesophageal reflux disease)   . Hypothyroidism   . Anemia 2016  . Seizures ([place])     according to echart- seizures vs pseudoseizures  . Seizure ([place]) 2014    last seizure 2(two) years ago  . Learning disability     Past Surgical History  Procedure Laterality Date  . Inner ear surgery Bilateral 1994    poor historian  . Tonsillectomy    . Hysteroscopy w/d&c N/A 12/19/2014    Procedure: DILATATION AND CURETTAGE /HYSTEROSCOPY;  Surgeon: Rubie Maid, MD;  Location: [facility] ORS;  Service: Gynecology;  Laterality: N/A;  . Dilation and curettage of uterus    . Abdominal hysterectomy N/A 01/09/2015    Procedure: HYSTERECTOMY ABDOMINAL/BILATERAL SALPINGECTOMY;  Surgeon: Rubie Maid, MD;  Location: [facility] ORS;  Service: Gynecology;  Laterality: N/A;    Family History  Problem Relation Age of Onset  . Colon polyps Mother   . Celiac disease Mother   . Diabetes Paternal Grandmother   . Heart disease Paternal Grandfather   . Colon polyps Father   . Colon polyps Paternal Aunt   .  Celiac disease Brother   . Diabetes Maternal Aunt   . Heart disease Maternal Grandfather   . Heart disease Maternal Uncle   . Heart disease Maternal Aunt   . Diabetes Sister   . Seizures Neg Hx     Social History   Social History  . Marital Status: Single    Spouse Name: N/A  . Number of Children: 0  . Years of Education: 10   Occupational History  . Not on file.   Social History Main Topics  . Smoking status: Never Smoker   . Smokeless tobacco: Never Used  . Alcohol Use: No  . Drug Use: No  . Sexual Activity: No   Other Topics Concern  . Not on file   Social History Narrative   Patient lives at home with mom.    Patient does not have any children.    Patient has a 10th grade  education.    Patient is single.    Patient is left handed.    Does not have a living will or HPOA- full code.   The PMH, PSH, Social History, Family History, Medications, and allergies have been reviewed in [facility], and have been updated if relevant.  Review of Systems  Constitutional: Negative.   HENT: Negative.   Gastrointestinal: Negative.   Endocrine: Negative.   Musculoskeletal: Negative.   Neurological: Negative.   Psychiatric/Behavioral: Negative.   All other systems reviewed and are negative.      Objective:    BP 132/72 mmHg  Pulse 70  Temp(Src) 98 F (36.7 C) (Oral)  Wt 277 lb 12 oz (125.987 kg)  SpO2 98%  LMP 12/19/2014 (Exact Date)   Physical Exam   General: Well-developed,well-nourished,in no acute distress; alert,appropriate and cooperative throughout examination Head: normocephalic and atraumatic.  Mild thyromegaly, no nodules or TTP Eyes: vision grossly intact, pupils equal, pupils round, and pupils reactive to light.  Lungs: Normal respiratory effort, chest expands symmetrically. Lungs are clear to auscultation, no crackles or wheezes. Heart: Normal rate and regular rhythm. S1 and S2 normal without gallop, murmur, click, rub or other extra sounds. Abdomen: Bowel sounds positive,abdomen soft and non-tender without masses, organomegaly or hernias noted. Msk: No deformity or scoliosis noted of thoracic or lumbar spine.  Extremities:  Trace edema bilaterally No clubbing, cyanosis, e or deformity noted with normal full range of motion of all joints.  Psych: Good eye contact, conversant but shy     Assessment & Plan:   Hypothyroidism, unspecified hypothyroidism type - Plan: TSH, T4, Free, T3, Free  Need for influenza vaccination - Plan: Flu Vaccine QUAD 36+ mos PF IM (Fluarix & Fluzone Quad PF) No Follow-up on file.

## 2015-04-24 NOTE — Assessment & Plan Note (Signed)
Continue current rx. Labs today. The patient indicates understanding of these issues and agrees with the plan.  Orders Placed This Encounter  Procedures  . Flu Vaccine QUAD 36+ mos PF IM (Fluarix & Fluzone Quad PF)  . TSH  . T4, Free  . T3, Free

## 2015-04-25 ENCOUNTER — Other Ambulatory Visit: Payer: Self-pay | Admitting: Family Medicine

## 2015-04-25 DIAGNOSIS — E038 Other specified hypothyroidism: Secondary | ICD-10-CM

## 2015-05-03 ENCOUNTER — Ambulatory Visit: Payer: Medicare Other | Admitting: Nurse Practitioner

## 2015-05-04 ENCOUNTER — Encounter: Payer: Self-pay | Admitting: Nurse Practitioner

## 2015-05-11 ENCOUNTER — Other Ambulatory Visit: Payer: Self-pay | Admitting: Family Medicine

## 2015-05-16 ENCOUNTER — Encounter: Payer: Self-pay | Admitting: Internal Medicine

## 2015-05-16 ENCOUNTER — Ambulatory Visit (INDEPENDENT_AMBULATORY_CARE_PROVIDER_SITE_OTHER): Payer: Medicare Other | Admitting: Internal Medicine

## 2015-05-16 VITALS — BP 110/62 | HR 77 | Temp 97.9°F | Resp 12 | Ht 66.0 in | Wt 280.0 lb

## 2015-05-16 DIAGNOSIS — E039 Hypothyroidism, unspecified: Secondary | ICD-10-CM

## 2015-05-16 MED ORDER — LIOTHYRONINE SODIUM 25 MCG PO TABS: 12.5000 ug | ORAL_TABLET | Freq: Every day | ORAL | Status: DC

## 2015-05-16 MED ORDER — LEVOTHYROXINE SODIUM 100 MCG PO TABS: 100.0000 ug | ORAL_TABLET | Freq: Every day | ORAL | Status: DC

## 2015-05-16 NOTE — Patient Instructions (Signed)
Please move Protonix at least 4h after the thyroid medication.  Take the thyroid hormone every day, with water, at least 30 minutes before breakfast, separated by at least 4 hours from: - acid reflux medications - calcium - iron - multivitamins  Please increase Levothyroxine to 100 mcg daily. Please decrease Cytomel to 12.5 mcg daily.  Please return in 4 months for a visit, but in 5 weeks for labs.

## 2015-05-16 NOTE — Progress Notes (Signed)
Patient ID: Nancy Hall, female   DOB: Sep 05, 1970, 45 y.o.   MRN: [id number]   HPI  Nancy Hall is a 45 y.o.-year-old female, referred by her PCP, Dr. Deborra Medina, for management of uncontrolled hypothyroidism.  Pt. has been dx with hypothyroidism in 5433 (45 years old); she is on Levothyroxine 75 mcg + Cytomel 25 mcg daily, taken: - fasting - with water - separated by >30 min from b'fast  - no calcium, iron, multivitamins  - + PPI at the same time with LT4 and LT3!  I reviewed pt's thyroid tests: Lab Results  Component Value Date   TSH 7.12* 04/24/2015   TSH 11.180* 11/03/2014   TSH 1.11 10/27/2013   TSH 13.11* 09/14/2013   TSH 0.06* 05/06/2012   TSH 0.04* 05/30/2011   FREET4 0.50* 04/24/2015   FREET4 0.51* 10/27/2013   FREET4 0.46* 09/14/2013   FREET4 1.00 05/06/2012   FREET4 0.97 05/30/2011    Pt describes: - + weight gain: 6 lbs in last 3 mo - + Increased appetite - + Intermittent fatigue - no cold intolerance; + heat intolerance - + depression, + anxiety - no palpitations - no constipation, no diarrhea - no dry skin - no  hair loss  Pt denies feeling nodules in neck, hoarseness, dysphagia/odynophagia, SOB with lying down.  She has + FH of thyroid disorders in: MGM. No FH of thyroid cancer.  No h/o radiation tx to head or neck. No recent use of iodine supplements.  I reviewed her chart and she also has a history of Bipolar disease, depression, GERD, h/o fibroids, h/o hysterectomy, h/o ear infections, h/o seizures - no sz in last 3 years.  ROS: Constitutional: See history of present illness Eyes: no blurry vision, no xerophthalmia ENT: no sore throat, no nodules palpated in throat, no dysphagia/odynophagia, no hoarseness Cardiovascular: no CP/SOB/palpitations/leg swelling Respiratory: no cough/SOB Gastrointestinal: no N/V/D/C Musculoskeletal: no muscle/joint aches Skin: no rashes Neurological: no tremors/numbness/tingling/dizziness Psychiatric: + Both:  depression/anxiety  Past Medical History  Diagnosis Date  . Depression   . Thyroid disease   . Anxiety   . Sleep apnea   . Esophagitis     LA Class A  . Hiatal hernia   . Pseudoseizures   . Obesity   . Bipolar disorder ([place])   . GERD (gastroesophageal reflux disease)   . Hypothyroidism   . Anemia 2016  . Seizures ([place])     according to echart- seizures vs pseudoseizures  . Seizure ([place]) 2014    last seizure 2(two) years ago  . Learning disability    Past Surgical History  Procedure Laterality Date  . Inner ear surgery Bilateral 1994    poor historian  . Tonsillectomy    . Hysteroscopy w/d&c N/A 12/19/2014    Procedure: DILATATION AND CURETTAGE /HYSTEROSCOPY;  Surgeon: Rubie Maid, MD;  Location: [facility] ORS;  Service: Gynecology;  Laterality: N/A;  . Dilation and curettage of uterus    . Abdominal hysterectomy N/A 01/09/2015    Procedure: HYSTERECTOMY ABDOMINAL/BILATERAL SALPINGECTOMY;  Surgeon: Rubie Maid, MD;  Location: [facility] ORS;  Service: Gynecology;  Laterality: N/A;   Social History   Social History  . Marital Status: Single    Spouse Name: N/A  . Number of Children: 0  . Years of Education: 10   Social History Main Topics  . Smoking status: Never Smoker   . Smokeless tobacco: Never Used  . Alcohol Use: No  . Drug Use: No   Social History Narrative   Patient  lives at home with mom.    Patient does not have any children.    Patient has a 10th grade education.    Patient is single.    Patient is left handed.    Does not have a living will or HPOA- full code.   Current Outpatient Prescriptions on File Prior to Visit  Medication Sig Dispense Refill  . ARIPiprazole (ABILIFY) 15 MG tablet Take 1 tablet (15 mg total) by mouth daily. OFFICE VISIT REQUIRED FOR ADDITIONAL REFILLS 30 tablet 0  . docusate sodium (COLACE) 100 MG capsule Take 1 capsule (100 mg total) by mouth 2 (two) times daily as needed for mild constipation. 60 capsule 1  . hydrocortisone cream  0.5 % Apply 1 application topically 2 (two) times daily. 30 g 0  . ibuprofen (ADVIL,MOTRIN) 800 MG tablet Take 1 tablet (800 mg total) by mouth every 8 (eight) hours as needed. 60 tablet 1  . lamoTRIgine (LAMICTAL) 150 MG tablet TAKE 1 TABLET BY MOUTH 2 TIMES DAILY. 60 tablet 3  . omeprazole (PRILOSEC) 40 MG capsule TAKE 1 CAPSULE (40 MG TOTAL) BY MOUTH DAILY. 90 capsule 1  . sertraline (ZOLOFT) 100 MG tablet TAKE ONE TABLET BY MOUTH DAILY 30 tablet 2  . topiramate (TOPAMAX) 100 MG tablet TAKE 1 TABLET IN THE MORNING AND 1 & 1/2 TABLETS AT NIGHT 225 tablet 2   No current facility-administered medications on file prior to visit.   Allergies  Allergen Reactions  . Penicillins Rash   Family History  Problem Relation Age of Onset  . Colon polyps Mother   . Celiac disease Mother   . Diabetes Paternal Grandmother   . Heart disease Paternal Grandfather   . Colon polyps Father   . Colon polyps Paternal Aunt   . Celiac disease Brother   . Diabetes Maternal Aunt   . Heart disease Maternal Grandfather   . Heart disease Maternal Uncle   . Heart disease Maternal Aunt   . Diabetes Sister   . Seizures Neg Hx    PE: BP 110/62 mmHg  Pulse 77  Temp(Src) 97.9 F (36.6 C) (Oral)  Resp 12  Ht 5\' 6"  (1.676 m)  Wt 280 lb (127.007 kg)  BMI 45.21 kg/m2  SpO2 98%  LMP 12/19/2014 (Exact Date) Wt Readings from Last 3 Encounters:  05/16/15 280 lb (127.007 kg)  04/24/15 277 lb 12 oz (125.987 kg)  02/21/15 274 lb 9.6 oz (124.558 kg)   Constitutional: Obese, in NAD Eyes: PERRLA, EOMI, no exophthalmos ENT: moist mucous membranes, no thyromegaly, no cervical lymphadenopathy Cardiovascular: RRR, No MRG Respiratory: CTA B Gastrointestinal: abdomen soft, NT, ND, BS+ Musculoskeletal: no deformities, strength intact in all 4 Skin: moist, warm, no rashes Neurological: no tremor with outstretched hands but has chin tremors, DTR normal in all 4  ASSESSMENT: 1. Hypothyroidism  PLAN:  1. Patient with  long-standing hypothyroidism, on levothyroxine (LT4) + liothyronine (LT3) therapy. She appears euthyroid, but it is difficult to extract information from the patient as she is mostly nonverbal, and the answers I usually provided by her mother. She does not appear to have a goiter, thyroid nodules, or neck compression symptoms. - We discussed about the fact that she is on a higher dose of LT3 (25 g, equivalent to 100 g of LT4) compared to LT4 (75 g). This is unphysiologic >> the regular ratio is approximately 1:16 in the favor of LT4. We will, therefore, decrease the dose of Cytomel to 12.5 g daily and increase the dose  of levothyroxine to 100 g daily. - We discussed about correct intake of levothyroxine, fasting, with water, separated by at least 30 minutes from breakfast, and separated by more than 4 hours from calcium, iron, multivitamins, acid reflux medications (PPIs). She is taking her PPIs along with thyroid medication in the morning, and I advised her to move Protonix at least 4 hours later. I explained that if she is not taking the thyroid hormones correctly, this can induce variability in the thyroid tests. She will start doing so and then we will check her thyroid tests: TSH, free T4, free T3 - in 4-5 weeks from now to check the dose. - If these are abnormal, she will need to return in 6-8 weeks for repeat labs - If these are normal, I will see her back in 4 months  Orders Placed This Encounter  Procedures  . T3, free  . T4, free  . TSH

## 2015-05-18 ENCOUNTER — Other Ambulatory Visit: Payer: Self-pay | Admitting: Family Medicine

## 2015-05-26 ENCOUNTER — Other Ambulatory Visit: Payer: Self-pay | Admitting: Family Medicine

## 2015-05-26 DIAGNOSIS — Z0000 Encounter for general adult medical examination without abnormal findings: Secondary | ICD-10-CM

## 2015-05-28 ENCOUNTER — Other Ambulatory Visit: Payer: Self-pay | Admitting: Family Medicine

## 2015-05-31 ENCOUNTER — Encounter: Payer: Medicare Other | Admitting: Family Medicine

## 2015-06-05 ENCOUNTER — Other Ambulatory Visit (INDEPENDENT_AMBULATORY_CARE_PROVIDER_SITE_OTHER): Payer: Medicare Other

## 2015-06-05 DIAGNOSIS — Z0000 Encounter for general adult medical examination without abnormal findings: Secondary | ICD-10-CM

## 2015-06-05 DIAGNOSIS — E039 Hypothyroidism, unspecified: Secondary | ICD-10-CM | POA: Diagnosis not present

## 2015-06-05 LAB — CBC WITH DIFFERENTIAL/PLATELET
BASOS PCT: 0.7 % (ref 0.0–3.0)
Basophils Absolute: 0 10*3/uL (ref 0.0–0.1)
Eosinophils Absolute: 0.4 10*3/uL (ref 0.0–0.7)
Eosinophils Relative: 7 % — ABNORMAL HIGH (ref 0.0–5.0)
HEMATOCRIT: 35.3 % — AB (ref 36.0–46.0)
Hemoglobin: 11.7 g/dL — ABNORMAL LOW (ref 12.0–15.0)
LYMPHS PCT: 18.8 % (ref 12.0–46.0)
Lymphs Abs: 1 10*3/uL (ref 0.7–4.0)
MCHC: 33.2 g/dL (ref 30.0–36.0)
MCV: 93 fl (ref 78.0–100.0)
Monocytes Absolute: 0.3 10*3/uL (ref 0.1–1.0)
Monocytes Relative: 6.4 % (ref 3.0–12.0)
NEUTROS ABS: 3.6 10*3/uL (ref 1.4–7.7)
Neutrophils Relative %: 67.1 % (ref 43.0–77.0)
PLATELETS: 152 10*3/uL (ref 150.0–400.0)
RBC: 3.79 Mil/uL — ABNORMAL LOW (ref 3.87–5.11)
RDW: 13.9 % (ref 11.5–15.5)
WBC: 5.3 10*3/uL (ref 4.0–10.5)

## 2015-06-05 LAB — LIPID PANEL
CHOL/HDL RATIO: 5
Cholesterol: 180 mg/dL (ref 0–200)
HDL: 39.3 mg/dL (ref >39.00)
LDL CALC: 116 mg/dL — AB (ref 0–99)
NonHDL: 140.62
TRIGLYCERIDES: 124 mg/dL (ref 0.0–149.0)
VLDL: 24.8 mg/dL (ref 0.0–40.0)

## 2015-06-05 LAB — COMPREHENSIVE METABOLIC PANEL
ALT: 12 U/L (ref 0–35)
AST: 12 U/L (ref 0–37)
Albumin: 3.8 g/dL (ref 3.5–5.2)
Alkaline Phosphatase: 80 U/L (ref 39–117)
BUN: 11 mg/dL (ref 6–23)
CALCIUM: 8.6 mg/dL (ref 8.4–10.5)
CHLORIDE: 109 meq/L (ref 96–112)
CO2: 24 meq/L (ref 19–32)
Creatinine, Ser: 0.91 mg/dL (ref 0.40–1.20)
GFR: 71.83 mL/min (ref >60.00)
Glucose, Bld: 114 mg/dL — ABNORMAL HIGH (ref 70–99)
POTASSIUM: 3.8 meq/L (ref 3.5–5.1)
Sodium: 139 mEq/L (ref 135–145)
Total Bilirubin: 0.5 mg/dL (ref 0.2–1.2)
Total Protein: 6.2 g/dL (ref 6.0–8.3)

## 2015-06-05 LAB — T4, FREE: Free T4: 0.76 ng/dL (ref 0.60–1.60)

## 2015-06-05 LAB — TSH: TSH: 2.43 u[IU]/mL (ref 0.35–4.50)

## 2015-06-05 LAB — T3, FREE: T3 FREE: 3 pg/mL (ref 2.3–4.2)

## 2015-06-08 ENCOUNTER — Encounter: Payer: Medicare Other | Admitting: Family Medicine

## 2015-06-27 ENCOUNTER — Ambulatory Visit (INDEPENDENT_AMBULATORY_CARE_PROVIDER_SITE_OTHER): Payer: Medicare Other | Admitting: Family Medicine

## 2015-06-27 ENCOUNTER — Encounter: Payer: Self-pay | Admitting: Family Medicine

## 2015-06-27 VITALS — BP 122/74 | HR 68 | Temp 97.6°F | Ht 65.25 in | Wt 275.5 lb

## 2015-06-27 DIAGNOSIS — E785 Hyperlipidemia, unspecified: Secondary | ICD-10-CM | POA: Diagnosis not present

## 2015-06-27 DIAGNOSIS — E039 Hypothyroidism, unspecified: Secondary | ICD-10-CM

## 2015-06-27 DIAGNOSIS — F333 Major depressive disorder, recurrent, severe with psychotic symptoms: Secondary | ICD-10-CM

## 2015-06-27 DIAGNOSIS — R569 Unspecified convulsions: Secondary | ICD-10-CM | POA: Diagnosis not present

## 2015-06-27 DIAGNOSIS — F3170 Bipolar disorder, currently in remission, most recent episode unspecified: Secondary | ICD-10-CM

## 2015-06-27 DIAGNOSIS — G40309 Generalized idiopathic epilepsy and epileptic syndromes, not intractable, without status epilepticus: Secondary | ICD-10-CM

## 2015-06-27 DIAGNOSIS — F79 Unspecified intellectual disabilities: Secondary | ICD-10-CM

## 2015-06-27 DIAGNOSIS — Z0000 Encounter for general adult medical examination without abnormal findings: Secondary | ICD-10-CM

## 2015-06-27 MED ORDER — HYDROCORTISONE 0.5 % EX CREA: 1.0000 "application " | TOPICAL_CREAM | Freq: Two times a day (BID) | CUTANEOUS | Status: DC

## 2015-06-27 NOTE — Assessment & Plan Note (Signed)
Reasonable control. No changes made to rxs today. 

## 2015-06-27 NOTE — Assessment & Plan Note (Addendum)
Deteriorated. Psychiatrist no longer in network. Need referral to a new psychiatrist- referral placed.

## 2015-06-27 NOTE — Assessment & Plan Note (Signed)
Followed by Dr. Cruzita Lederer. Clinically euthyroid. No changes made to rxs today.

## 2015-06-27 NOTE — Progress Notes (Addendum)
Subjective:    Patient ID: [name], female    DOB: 1970-04-09, 45 y.o.   MRN: [id number]  HPI  45 yo female with h/o MR, seizures, depression with psychosis and pseudoseizures, hypothyroidism here with her mom for medicare wellness visit with several concerns.  I have personally reviewed the Medicare Annual Wellness questionnaire and have noted 1. The patient's medical and social history 2. Their use of alcohol, tobacco or illicit drugs 3. Their current medications and supplements 4. The patient's functional ability including ADL's, fall risks, home safety risks and hearing or visual             impairment. 5. Diet and physical activities 6. Evidence for depression or mood disorders  End of life wishes discussed and updated in Social History.  The roster of all physicians providing medical care to patient - is listed in the CareTeams section of the chart.  S/p hysterectomy Not sexually active.   No family h/o cervical CA. Mammogram 12/07/13     Lab Results  Component Value Date   WBC 5.3 06/05/2015   HGB 11.7* 06/05/2015   HCT 35.3* 06/05/2015   MCV 93.0 06/05/2015   PLT 152.0 06/05/2015      Seizure d/o - first grandmal seziure at 84 yo.  Has "emotional seizures/pseudoseizures," had a few more around her father's death in January 25, 2013.  Followed by neuro- last saw Dr Jannifer Franklin on 04/05/14- note reviewed.  Advised continuing Topamax 100 mg twice dailyand set up for sleep study.  Bipolar disorder-was  followed by Pattricia Boss every 2- 3 weeks but she stopped taking medicare. She is endorsing more symptoms of depression.      Hypothyroidism- now followed by Dr. Cruzita Lederer- last seen by her on 05/16/15- note reviewed. Cytomel dose decreased and levothyroxine dose increased.   Lab Results  Component Value Date   TSH 2.43 06/05/2015   Wt Readings from Last 3 Encounters:  06/27/15 275 lb 8 oz (124.966 kg)  05/16/15 280 lb (127.007 kg)  04/24/15 277 lb 12 oz (125.987 kg)    Hyperglycemia- Fasting glucose 111 on CMET.  Lab Results  Component Value Date   ALT 12 06/05/2015   AST 12 06/05/2015   ALKPHOS 80 06/05/2015   BILITOT 0.5 06/05/2015    Lab Results  Component Value Date   CHOL 180 06/05/2015   HDL 39.30 06/05/2015   LDLCALC 116* 06/05/2015   LDLDIRECT 86.7 05/06/2012   TRIG 124.0 06/05/2015   CHOLHDL 5 06/05/2015     Patient Active Problem List   Diagnosis Date Noted  . Medicare annual wellness visit, subsequent 06/27/2015  . Morbid obesity ([place]) 04/05/2014  . Medicare annual wellness visit, initial 09/21/2013  . Hyperglycemia 09/21/2013  . Dyslipidemia 09/14/2013  . Urinary incontinence 06/15/2013  . 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 Gynecological examination 06/24/2011  . Hypothyroidism 05/30/2011  . 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 MR (mental retardation) 05/30/2011  . Bipolar disorder ([place]) 05/30/2011  . Sleep apnea 05/30/2011  . Menorrhagia 05/30/2011  . Seizures ([place])    Past Medical History  Diagnosis Date  . Depression   . Thyroid disease   . Anxiety   . Sleep apnea   . Esophagitis     LA Class A  . Hiatal hernia   . Pseudoseizures   . Obesity   . Bipolar disorder ([place])   . GERD (gastroesophageal reflux disease)   . Hypothyroidism   . Anemia  2016  . Seizures ([place])     according to echart- seizures vs pseudoseizures  . Seizure ([place]) 2014    last seizure 2(two) years ago  . Learning disability    Past Surgical History  Procedure Laterality Date  . Inner ear surgery Bilateral 1994    poor historian  . Tonsillectomy    . Hysteroscopy w/d&c N/A 12/19/2014    Procedure: DILATATION AND CURETTAGE /HYSTEROSCOPY;  Surgeon: Rubie Maid, MD;  Location: [facility] ORS;  Service: Gynecology;  Laterality: N/A;  . Dilation and curettage of uterus    . Abdominal hysterectomy N/A 01/09/2015    Procedure: HYSTERECTOMY ABDOMINAL/BILATERAL  SALPINGECTOMY;  Surgeon: Rubie Maid, MD;  Location: [facility] ORS;  Service: Gynecology;  Laterality: N/A;   Social History  Substance Use Topics  . Smoking status: Never Smoker   . Smokeless tobacco: Never Used  . Alcohol Use: No   Family History  Problem Relation Age of Onset  . Colon polyps Mother   . Celiac disease Mother   . Diabetes Paternal Grandmother   . Heart disease Paternal Grandfather   . Colon polyps Father   . Colon polyps Paternal Aunt   . Celiac disease Brother   . Diabetes Maternal Aunt   . Heart disease Maternal Grandfather   . Heart disease Maternal Uncle   . Heart disease Maternal Aunt   . Diabetes Sister   . Seizures Neg Hx    Allergies  Allergen Reactions  . Penicillins Rash   Current Outpatient Prescriptions on File Prior to Visit  Medication Sig Dispense Refill  . ARIPiprazole (ABILIFY) 15 MG tablet TAKE 1 TABLET (15 MG TOTAL) BY MOUTH DAILY. OFFICE VISIT REQUIRED FOR ADDITIONAL REFILLS 30 tablet 0  . docusate sodium (COLACE) 100 MG capsule Take 1 capsule (100 mg total) by mouth 2 (two) times daily as needed for mild constipation. 60 capsule 1  . hydrocortisone cream 0.5 % Apply 1 application topically 2 (two) times daily. 30 g 0  . lamoTRIgine (LAMICTAL) 150 MG tablet TAKE 1 TABLET BY MOUTH 2 TIMES DAILY. 60 tablet 3  . levothyroxine (SYNTHROID, LEVOTHROID) 100 MCG tablet Take 1 tablet (100 mcg total) by mouth daily. 45 tablet 1  . liothyronine (CYTOMEL) 25 MCG tablet Take 0.5 tablets (12.5 mcg total) by mouth daily. 30 tablet 1  . omeprazole (PRILOSEC) 40 MG capsule TAKE 1 CAPSULE (40 MG TOTAL) BY MOUTH DAILY. 90 capsule 1  . omeprazole (PRILOSEC) 40 MG capsule TAKE 1 CAPSULE (40 MG TOTAL) BY MOUTH DAILY. 90 capsule 0  . sertraline (ZOLOFT) 100 MG tablet TAKE ONE TABLET BY MOUTH DAILY 30 tablet 2  . topiramate (TOPAMAX) 100 MG tablet TAKE 1 TABLET IN THE MORNING AND 1 & 1/2 TABLETS AT NIGHT 225 tablet 2   No current facility-administered medications on  file prior to visit.   The PMH, PSH, Social History, Family History, Medications, and allergies have been reviewed in [facility], and have been updated if relevant.  Review of Systems  Constitutional: Negative.   HENT: Negative.   Eyes: Negative.   Respiratory: Negative.   Cardiovascular: Negative.   Gastrointestinal: Negative.   Endocrine: Negative.   Genitourinary: Negative.   Musculoskeletal: Negative.   Skin: Negative.   Allergic/Immunologic: Negative.   Neurological: Negative.   Hematological: Negative.   Psychiatric/Behavioral: Positive for dysphoric mood. Negative for suicidal ideas and sleep disturbance. The patient is not nervous/anxious.   All other systems reviewed and are negative.    Objective:   Physical Exam BP  122/74 mmHg  Pulse 68  Temp(Src) 97.6 F (36.4 C) (Oral)  Ht 5' 5.25" (1.657 m)  Wt 275 lb 8 oz (124.966 kg)  BMI 45.51 kg/m2  SpO2 99%  LMP 12/19/2014 (Exact Date)  Wt Readings from Last 3 Encounters:  06/27/15 275 lb 8 oz (124.966 kg)  05/16/15 280 lb (127.007 kg)  04/24/15 277 lb 12 oz (125.987 kg)    General:  Well-developed,well-nourished,in no acute distress; alert,appropriate and cooperative throughout examination Head:  normocephalic and atraumatic.   Mild thyromegaly, no nodules or TTP Eyes:  vision grossly intact, pupils equal, pupils round, and pupils reactive to light.   Lungs:  Normal respiratory effort, chest expands symmetrically. Lungs are clear to auscultation, no crackles or wheezes. Heart:  Normal rate and regular rhythm. S1 and S2 normal without gallop, murmur, click, rub or other extra sounds. Abdomen:  Bowel sounds positive,abdomen soft and non-tender without masses, organomegaly or hernias noted. Msk:  No deformity or scoliosis noted of thoracic or lumbar spine.   Extremities:  Trace edema bilaterally  No clubbing, cyanosis, e or deformity noted with normal full range of motion of all joints.   Psych:  Good eye contact,  conversant but shy Assessment & Plan:

## 2015-06-27 NOTE — Progress Notes (Signed)
Pre visit review using our clinic review tool, if applicable. No additional management support is needed unless otherwise documented below in the visit note. 

## 2015-06-27 NOTE — Patient Instructions (Signed)
Good to see you. We are referring you to a new psychiatrist.  We will call you with more information.

## 2015-06-27 NOTE — Addendum Note (Signed)
Addended by: Lucille Passy on: 06/27/2015 09:38 AM   Modules accepted: Miquel Dunn

## 2015-06-27 NOTE — Assessment & Plan Note (Signed)
The patients weight, height, BMI and visual acuity have been recorded in the chart.  Cognitive function assessed.   I have made referrals, counseling and provided education to the patient based review of the above and I have provided the pt with a written personalized care plan for preventive services.  

## 2015-06-27 NOTE — Assessment & Plan Note (Signed)
Followed by neuro. No changes made to rxs today.

## 2015-06-28 ENCOUNTER — Other Ambulatory Visit: Payer: Self-pay | Admitting: Family Medicine

## 2015-07-12 ENCOUNTER — Ambulatory Visit: Payer: Medicare Other | Admitting: Licensed Clinical Social Worker

## 2015-07-19 ENCOUNTER — Ambulatory Visit: Payer: Medicare Other | Admitting: Family Medicine

## 2015-07-20 ENCOUNTER — Ambulatory Visit (INDEPENDENT_AMBULATORY_CARE_PROVIDER_SITE_OTHER): Payer: Medicare Other | Admitting: Family Medicine

## 2015-07-20 ENCOUNTER — Ambulatory Visit (INDEPENDENT_AMBULATORY_CARE_PROVIDER_SITE_OTHER)
Admission: RE | Admit: 2015-07-20 | Discharge: 2015-07-20 | Disposition: A | Discharge status: Home / Self Care | Payer: Medicare Other | Source: Ambulatory Visit | Attending: Family Medicine | Admitting: Family Medicine

## 2015-07-20 VITALS — BP 112/70 | HR 68 | Temp 97.8°F | Wt 277.5 lb

## 2015-07-20 DIAGNOSIS — M545 Low back pain, unspecified: Secondary | ICD-10-CM | POA: Insufficient documentation

## 2015-07-20 DIAGNOSIS — M79604 Pain in right leg: Secondary | ICD-10-CM

## 2015-07-20 DIAGNOSIS — M79605 Pain in left leg: Secondary | ICD-10-CM | POA: Diagnosis not present

## 2015-07-20 DIAGNOSIS — M5440 Lumbago with sciatica, unspecified side: Secondary | ICD-10-CM

## 2015-07-20 DIAGNOSIS — M47816 Spondylosis without myelopathy or radiculopathy, lumbar region: Secondary | ICD-10-CM | POA: Diagnosis not present

## 2015-07-20 NOTE — Patient Instructions (Signed)
You have musculoskeletal low back pain Take tylenol for baseline pain relief (1-2 extra strength tabs 3x/day) Ok to still take Ibuprofen.  Stay as active as possible.  We will call you with your xray results and your PT appointment.

## 2015-07-20 NOTE — Progress Notes (Signed)
SUBJECTIVE:  Nancy Hall is a 45 y.o. female who complains of low back pain for 3 week(s), positional with bending or lifting, with radiation down the legs. Precipitating factors: none recalled by the patient. Prior history of back problems: recurrent self limited episodes of low back pain in the past. There is no numbness in the legs.  Current Outpatient Prescriptions on File Prior to Visit  Medication Sig Dispense Refill  . ARIPiprazole (ABILIFY) 15 MG tablet TAKE 1 TABLET (15 MG TOTAL) BY MOUTH DAILY. OFFICE VISIT REQUIRED FOR ADDITIONAL REFILLS 30 tablet 0  . docusate sodium (COLACE) 100 MG capsule Take 1 capsule (100 mg total) by mouth 2 (two) times daily as needed for mild constipation. 60 capsule 1  . hydrocortisone cream 0.5 % Apply 1 application topically 2 (two) times daily. 30 g 0  . lamoTRIgine (LAMICTAL) 150 MG tablet TAKE 1 TABLET BY MOUTH 2 TIMES DAILY. 60 tablet 3  . levothyroxine (SYNTHROID, LEVOTHROID) 100 MCG tablet Take 1 tablet (100 mcg total) by mouth daily. 45 tablet 1  . liothyronine (CYTOMEL) 25 MCG tablet Take 0.5 tablets (12.5 mcg total) by mouth daily. 30 tablet 1  . omeprazole (PRILOSEC) 40 MG capsule TAKE 1 CAPSULE (40 MG TOTAL) BY MOUTH DAILY. 90 capsule 0  . sertraline (ZOLOFT) 100 MG tablet TAKE ONE TABLET BY MOUTH DAILY 30 tablet 2  . topiramate (TOPAMAX) 100 MG tablet TAKE 1 TABLET IN THE MORNING AND 1 & 1/2 TABLETS AT NIGHT 225 tablet 2   No current facility-administered medications on file prior to visit.    Allergies  Allergen Reactions  . Penicillins Rash    Past Medical History  Diagnosis Date  . Depression   . Thyroid disease   . Anxiety   . Sleep apnea   . Esophagitis     LA Class A  . Hiatal hernia   . Pseudoseizures   . Obesity   . Bipolar disorder ([place])   . GERD (gastroesophageal reflux disease)   . Hypothyroidism   . Anemia 2016  . Seizures ([place])     according to echart- seizures vs pseudoseizures  . Seizure ([place]) 2014    last  seizure 2(two) years ago  . Learning disability     Past Surgical History  Procedure Laterality Date  . Inner ear surgery Bilateral 1994    poor historian  . Tonsillectomy    . Hysteroscopy w/d&c N/A 12/19/2014    Procedure: DILATATION AND CURETTAGE /HYSTEROSCOPY;  Surgeon: Rubie Maid, MD;  Location: [facility] ORS;  Service: Gynecology;  Laterality: N/A;  . Dilation and curettage of uterus    . Abdominal hysterectomy N/A 01/09/2015    Procedure: HYSTERECTOMY ABDOMINAL/BILATERAL SALPINGECTOMY;  Surgeon: Rubie Maid, MD;  Location: [facility] ORS;  Service: Gynecology;  Laterality: N/A;    Family History  Problem Relation Age of Onset  . Colon polyps Mother   . Celiac disease Mother   . Diabetes Paternal Grandmother   . Heart disease Paternal Grandfather   . Colon polyps Father   . Colon polyps Paternal Aunt   . Celiac disease Brother   . Diabetes Maternal Aunt   . Heart disease Maternal Grandfather   . Heart disease Maternal Uncle   . Heart disease Maternal Aunt   . Diabetes Sister   . Seizures Neg Hx     Social History   Social History  . Marital Status: Single    Spouse Name: N/A  . Number of Children: 0  . Years of  Education: 10   Occupational History  . Not on file.   Social History Main Topics  . Smoking status: Never Smoker   . Smokeless tobacco: Never Used  . Alcohol Use: No  . Drug Use: No  . Sexual Activity: No   Other Topics Concern  . Not on file   Social History Narrative   Patient lives at home with mom.    Patient does not have any children.    Patient has a 10th grade education.    Patient is single.    Patient is left handed.    Does not have a living will or HPOA- full code.   The PMH, PSH, Social History, Family History, Medications, and allergies have been reviewed in [facility], and have been updated if relevant.  OBJECTIVE: BP 112/70 mmHg  Pulse 68  Temp(Src) 97.8 F (36.6 C) (Oral)  Wt 277 lb 8 oz (125.873 kg)  SpO2 99%  LMP 12/19/2014  (Exact Date)  Patient appears to be in mild to moderate pain, antalgic gait noted. Lumbosacral spine area reveals no local tenderness or mass.  Painful and reduced LS ROM noted. Straight leg raise is positive at 90 degrees on bilateral. DTR's, motor strength and sensation normal, including heel and toe gait.  Peripheral pulses are palpable. X-Ray: ordered, but results not yet available.  ASSESSMENT:  lumbar strain  PLAN: For acute pain, rest, intermittent application of heat (do not sleep on heating pad), analgesics and muscle relaxants are recommended. Xray and PT referral placed today.

## 2015-07-20 NOTE — Progress Notes (Signed)
Pre visit review using our clinic review tool, if applicable. No additional management support is needed unless otherwise documented below in the visit note. 

## 2015-07-21 ENCOUNTER — Telehealth: Payer: Self-pay | Admitting: Family Medicine

## 2015-07-21 NOTE — Telephone Encounter (Signed)
Mom called wanting to get a call from [place] Pt didn't understand information about x ray results  bestnumber [phone]

## 2015-07-25 DIAGNOSIS — H60399 Other infective otitis externa, unspecified ear: Secondary | ICD-10-CM | POA: Diagnosis not present

## 2015-07-25 DIAGNOSIS — H6983 Other specified disorders of Eustachian tube, bilateral: Secondary | ICD-10-CM | POA: Diagnosis not present

## 2015-07-25 DIAGNOSIS — H66001 Acute suppurative otitis media without spontaneous rupture of ear drum, right ear: Secondary | ICD-10-CM | POA: Diagnosis not present

## 2015-07-25 DIAGNOSIS — H60331 Swimmer's ear, right ear: Secondary | ICD-10-CM | POA: Diagnosis not present

## 2015-07-25 DIAGNOSIS — H903 Sensorineural hearing loss, bilateral: Secondary | ICD-10-CM | POA: Diagnosis not present

## 2015-07-25 NOTE — Telephone Encounter (Signed)
Spoke to pts mother and informed her of results.

## 2015-07-26 ENCOUNTER — Ambulatory Visit: Payer: Medicare Other | Admitting: Psychiatry

## 2015-07-27 DIAGNOSIS — M545 Low back pain: Secondary | ICD-10-CM | POA: Diagnosis not present

## 2015-07-31 DIAGNOSIS — M545 Low back pain: Secondary | ICD-10-CM | POA: Diagnosis not present

## 2015-08-02 DIAGNOSIS — M545 Low back pain: Secondary | ICD-10-CM | POA: Diagnosis not present

## 2015-08-03 ENCOUNTER — Ambulatory Visit: Payer: Medicare Other | Admitting: Licensed Clinical Social Worker

## 2015-08-08 ENCOUNTER — Ambulatory Visit: Payer: Medicare Other | Admitting: Psychiatry

## 2015-08-08 ENCOUNTER — Other Ambulatory Visit: Payer: Self-pay | Admitting: Family Medicine

## 2015-08-08 DIAGNOSIS — M545 Low back pain: Secondary | ICD-10-CM | POA: Diagnosis not present

## 2015-08-11 ENCOUNTER — Ambulatory Visit: Payer: Medicare Other | Admitting: Psychiatry

## 2015-08-11 ENCOUNTER — Other Ambulatory Visit: Payer: Self-pay | Admitting: Internal Medicine

## 2015-08-14 DIAGNOSIS — M545 Low back pain: Secondary | ICD-10-CM | POA: Diagnosis not present

## 2015-08-16 DIAGNOSIS — H903 Sensorineural hearing loss, bilateral: Secondary | ICD-10-CM | POA: Diagnosis not present

## 2015-08-16 DIAGNOSIS — H6983 Other specified disorders of Eustachian tube, bilateral: Secondary | ICD-10-CM | POA: Diagnosis not present

## 2015-08-17 DIAGNOSIS — M545 Low back pain: Secondary | ICD-10-CM | POA: Diagnosis not present

## 2015-09-05 ENCOUNTER — Encounter: Payer: Self-pay | Admitting: Nurse Practitioner

## 2015-09-05 ENCOUNTER — Ambulatory Visit (INDEPENDENT_AMBULATORY_CARE_PROVIDER_SITE_OTHER): Payer: Medicare Other | Admitting: Nurse Practitioner

## 2015-09-05 ENCOUNTER — Other Ambulatory Visit: Payer: Self-pay | Admitting: Family Medicine

## 2015-09-05 ENCOUNTER — Telehealth: Payer: Self-pay

## 2015-09-05 VITALS — BP 110/70 | HR 72 | Resp 20 | Ht 66.0 in | Wt 273.0 lb

## 2015-09-05 DIAGNOSIS — R0683 Snoring: Secondary | ICD-10-CM | POA: Diagnosis not present

## 2015-09-05 DIAGNOSIS — F445 Conversion disorder with seizures or convulsions: Secondary | ICD-10-CM

## 2015-09-05 DIAGNOSIS — R569 Unspecified convulsions: Secondary | ICD-10-CM

## 2015-09-05 DIAGNOSIS — G40309 Generalized idiopathic epilepsy and epileptic syndromes, not intractable, without status epilepticus: Secondary | ICD-10-CM

## 2015-09-05 MED ORDER — LAMOTRIGINE 150 MG PO TABS: ORAL_TABLET | ORAL | Status: DC

## 2015-09-05 MED ORDER — TOPIRAMATE 100 MG PO TABS: ORAL_TABLET | ORAL | Status: DC

## 2015-09-05 NOTE — Progress Notes (Signed)
[facility]  PATIENT: Nancy Hall DOB: 09-May-1970   REASON FOR VISIT: Follow-up for seizure disorder pseudoseizures, hypersomnia with possible sleep apnea and morbid obesity HISTORY FROM: Patient    HISTORY OF PRESENT ILLNESS:Nancy Hall is a 45 year old left-handed white female with a history of morbid obesity and a history of seizures. She has significant psychiatric disease, and she has had well-documented pseudoseizures. The patient has done quite well with her seizures and pseudoseizures since last seen, without any recurrence since February 2015. The patient does not operate a motor vehicle, and she never has. She lives at home with her mother, and she does not engage in any gainful employment. She has not had any new medical issues since last seen, but she has had some increasing problems with fatigue and excessive daytime drowsiness. The patient snores quite a bit at night, and a sleep study was ordered after her last visit in January of last year however I do not see any results and the patient does not use CPAP. She however denies excessive daytime drowsiness but she does snore. She returns for reevaluation    REVIEW OF SYSTEMS: Full 14 system review of systems performed and notable only for those listed, all others are neg:  Constitutional: neg  Cardiovascular: neg Ear/Nose/Throat:  hearing loss Skin: neg Eyes: neg Respiratory: neg Gastroitestinal: neg  Hematology/Lymphatic: neg  Endocrine: neg MusculoskeletalBack pain Neurological: History of seizure disorder and pseudoseizures Psychiatric:  Depression anxiety bipolar disorder Sleep Snoring  ALLERGIES: Allergies  Allergen Reactions  . Penicillins Rash    HOME MEDICATIONS: Outpatient Prescriptions Prior to Visit  Medication Sig Dispense Refill  . ARIPiprazole (ABILIFY) 15 MG tablet TAKE 1 TABLET (15 MG TOTAL) BY MOUTH DAILY. OFFICE VISIT REQUIRED FOR ADDITIONAL REFILLS 30 tablet 0  . docusate  sodium (COLACE) 100 MG capsule Take 1 capsule (100 mg total) by mouth 2 (two) times daily as needed for mild constipation. 60 capsule 1  . hydrocortisone cream 0.5 % Apply 1 application topically 2 (two) times daily. 30 g 0  . lamoTRIgine (LAMICTAL) 150 MG tablet TAKE 1 TABLET BY MOUTH 2 TIMES DAILY. 60 tablet 3  . levothyroxine (SYNTHROID, LEVOTHROID) 100 MCG tablet TAKE 1 TABLET (100 MCG TOTAL) BY MOUTH DAILY. 45 tablet 2  . liothyronine (CYTOMEL) 25 MCG tablet Take 0.5 tablets (12.5 mcg total) by mouth daily. 30 tablet 1  . omeprazole (PRILOSEC) 40 MG capsule TAKE 1 CAPSULE (40 MG TOTAL) BY MOUTH DAILY. 90 capsule 0  . sertraline (ZOLOFT) 100 MG tablet TAKE ONE TABLET BY MOUTH DAILY 30 tablet 2  . topiramate (TOPAMAX) 100 MG tablet TAKE 1 TABLET IN THE MORNING AND 1 & 1/2 TABLETS AT NIGHT 225 tablet 2   No facility-administered medications prior to visit.    PAST MEDICAL HISTORY: Past Medical History  Diagnosis Date  . Depression   . Thyroid disease   . Anxiety   . Sleep apnea   . Esophagitis     LA Class A  . Hiatal hernia   . Pseudoseizures   . Obesity   . Bipolar disorder ([place])   . GERD (gastroesophageal reflux disease)   . Hypothyroidism   . Anemia 2016  . Seizures ([place])     according to echart- seizures vs pseudoseizures  . Seizure ([place]) 2014    last seizure 2(two) years ago  . Learning disability     PAST SURGICAL HISTORY: Past Surgical History  Procedure Laterality Date  . Inner ear surgery Bilateral 1994  poor historian  . Tonsillectomy    . Hysteroscopy w/d&c N/A 12/19/2014    Procedure: DILATATION AND CURETTAGE /HYSTEROSCOPY;  Surgeon: Rubie Maid, MD;  Location: [facility] ORS;  Service: Gynecology;  Laterality: N/A;  . Dilation and curettage of uterus    . Abdominal hysterectomy N/A 01/09/2015    Procedure: HYSTERECTOMY ABDOMINAL/BILATERAL SALPINGECTOMY;  Surgeon: Rubie Maid, MD;  Location: [facility] ORS;  Service: Gynecology;  Laterality: N/A;    FAMILY  HISTORY: Family History  Problem Relation Age of Onset  . Colon polyps Mother   . Celiac disease Mother   . Diabetes Paternal Grandmother   . Heart disease Paternal Grandfather   . Colon polyps Father   . Colon polyps Paternal Aunt   . Celiac disease Brother   . Diabetes Maternal Aunt   . Heart disease Maternal Grandfather   . Heart disease Maternal Uncle   . Heart disease Maternal Aunt   . Diabetes Sister   . Seizures Neg Hx     SOCIAL HISTORY: Social History   Social History  . Marital Status: Single    Spouse Name: N/A  . Number of Children: 0  . Years of Education: 10   Occupational History  . Not on file.   Social History Main Topics  . Smoking status: Never Smoker   . Smokeless tobacco: Never Used  . Alcohol Use: No  . Drug Use: No  . Sexual Activity: No   Other Topics Concern  . Not on file   Social History Narrative   Patient lives at home with mom.    Patient does not have any children.    Patient has a 10th grade education.    Patient is single.    Patient is left handed.    Does not have a living will or HPOA- full code.     PHYSICAL EXAM  Filed Vitals:   09/05/15 1105  BP: 110/70  Pulse: 72  Resp: 20  Height: 5\' 6"  (1.676 m)  Weight: 273 lb (123.832 kg)   Body mass index is 44.08 kg/(m^2).  Generalized: Well developed, Morbidly obese femalein no acute distress  Head: normocephalic and atraumatic,. Oropharynx benign  Neck: Supple, no carotid bruits  Cardiac: Regular rate rhythm, no murmur  Musculoskeletal: No deformity   Neurological examination   Mentation: Alert oriented to time, place, history taking. Attention span and concentration appropriate. Recent and remote memory intact.  Follows all commands speech and language fluent.   Cranial nerve II-XII: Pupils were equal round reactive to light extraocular movements were full, visual field were full on confrontational test. Facial sensation and strength were normal. hearing was  intact to finger rubbing bilaterally. Uvula tongue midline. head turning and shoulder shrug were normal and symmetric.Tongue protrusion into cheek strength was normal. Motor: normal bulk and tone, full strength in the BUE, BLE, fine finger movements normal, no pronator drift.  Sensory: normal and symmetric to light touch, pinprick, and  Vibration,  in the upper and lower extremities Coordination: finger-nose-finger, heel-to-shin bilaterally, no dysmetria Reflexes1+ upper lower and symmetric  plantar responses were flexor bilaterally. Gait and Station: Rising up from seated position without assistance, normal stance,  moderate stride, good arm swing, smooth turning, able to perform tiptoe, and heel walking without difficulty. Tandem gait is steady  DIAGNOSTIC DATA (LABS, IMAGING, TESTING) - I reviewed patient records, labs, notes, testing and imaging myself where available.  Lab Results  Component Value Date   WBC 5.3 06/05/2015   HGB 11.7* 06/05/2015  HCT 35.3* 06/05/2015   MCV 93.0 06/05/2015   PLT 152.0 06/05/2015      Component Value Date/Time   NA 139 06/05/2015 0815   K 3.8 06/05/2015 0815   CL 109 06/05/2015 0815   CO2 24 06/05/2015 0815   GLUCOSE 114* 06/05/2015 0815   BUN 11 06/05/2015 0815   CREATININE 0.91 06/05/2015 0815   CALCIUM 8.6 06/05/2015 0815   PROT 6.2 06/05/2015 0815   ALBUMIN 3.8 06/05/2015 0815   AST 12 06/05/2015 0815   ALT 12 06/05/2015 0815   ALKPHOS 80 06/05/2015 0815   BILITOT 0.5 06/05/2015 0815   GFRNONAA >60 01/10/2015 0711   GFRAA >60 01/10/2015 0711   Lab Results  Component Value Date   CHOL 180 06/05/2015   HDL 39.30 06/05/2015   LDLCALC 116* 06/05/2015   LDLDIRECT 86.7 05/06/2012   TRIG 124.0 06/05/2015   CHOLHDL 5 06/05/2015    Lab Results  Component Value Date   TSH 2.43 06/05/2015      ASSESSMENT AND PLAN  45 y.o. year old female  has a past medical history of Depression;  Anxiety; Sleep apnea;  Pseudoseizures; Obesity;  Bipolar disorder ([place]); Anemia (2016); Seizures ([place]); Seizure ([place]) (2014); and Learning disability. here to follow-up. Last seizure was 2015.   PLAN: Continue Lamictal at current dose, will refill Continue Topamax at current dose will refill Call for seizure activity Sleep study evaluation Dr. Brett Fairy I explained in particular the risks and ramifications of untreated moderate to severe OSA, especially with respect to cardiovascular disease   difficult to treat hypertension, cardiac arrhythmias, or stroke. Even type 2 diabetes has, in part, been linked to untreated OSA. Symptoms of untreated OSA include daytime sleepiness, memory problems, mood irritability and mood disorder such as depression and anxiety, lack of energy, as well as recurrent headaches, especially morning headaches. We talked about trying to maintain a healthy lifestyle in general, as well as the importance of weight control. I encouraged the patient to eat healthy, exercise daily and keep well hydrated, to keep a scheduled bedtime and wake time routine, to not skip any meals and eat healthy snacks in between meals Follow up in 1 year Dennie Bible, [facility], [facility], [facility] [address] [place], [place] [address] [phone]

## 2015-09-05 NOTE — Progress Notes (Signed)
I have read the note, and I agree with the clinical assessment and plan.  WILLIS,CHARLES KEITH   

## 2015-09-05 NOTE — Telephone Encounter (Signed)
I called pt to discuss sleep consult with Dr. Brett Fairy per Hoyle Sauer, NP request. No answer. Left message on both home and cell numbers asking pt to call me back.

## 2015-09-05 NOTE — Patient Instructions (Signed)
Continue Lamictal at current dose, will refill Continue Topamax at current dose will refill Call for seizure activity Follow up in 1 year

## 2015-09-08 ENCOUNTER — Ambulatory Visit: Payer: Medicare Other | Admitting: Psychiatry

## 2015-09-18 ENCOUNTER — Ambulatory Visit: Payer: Medicare Other | Admitting: Internal Medicine

## 2015-09-20 ENCOUNTER — Other Ambulatory Visit: Payer: Self-pay | Admitting: Family Medicine

## 2015-09-27 ENCOUNTER — Other Ambulatory Visit: Payer: Self-pay | Admitting: Internal Medicine

## 2015-10-09 ENCOUNTER — Other Ambulatory Visit: Payer: Self-pay | Admitting: Neurology

## 2015-10-21 ENCOUNTER — Other Ambulatory Visit: Payer: Self-pay | Admitting: Family Medicine

## 2015-10-27 ENCOUNTER — Ambulatory Visit: Payer: Medicare Other | Admitting: Internal Medicine

## 2015-11-05 ENCOUNTER — Other Ambulatory Visit: Payer: Self-pay | Admitting: Internal Medicine

## 2015-12-05 ENCOUNTER — Other Ambulatory Visit: Payer: Self-pay

## 2015-12-05 ENCOUNTER — Ambulatory Visit: Payer: Medicare Other | Admitting: Internal Medicine

## 2015-12-05 MED ORDER — OMEPRAZOLE 40 MG PO CPDR: DELAYED_RELEASE_CAPSULE | ORAL | 0 refills | Status: DC

## 2015-12-05 NOTE — Telephone Encounter (Signed)
Pt request refill omeprazole to [facility]; pt last annual 06/27/15. Refill done per protocol and pt voiced understanding.

## 2015-12-21 ENCOUNTER — Encounter: Payer: Self-pay | Admitting: Internal Medicine

## 2015-12-21 ENCOUNTER — Ambulatory Visit (INDEPENDENT_AMBULATORY_CARE_PROVIDER_SITE_OTHER): Payer: Medicare Other | Admitting: Internal Medicine

## 2015-12-21 VITALS — BP 124/84 | HR 75 | Wt 288.0 lb

## 2015-12-21 DIAGNOSIS — E039 Hypothyroidism, unspecified: Secondary | ICD-10-CM

## 2015-12-21 LAB — T3, FREE: T3 FREE: 2.8 pg/mL (ref 2.3–4.2)

## 2015-12-21 LAB — TSH: TSH: 2.89 u[IU]/mL (ref 0.35–4.50)

## 2015-12-21 LAB — T4, FREE: FREE T4: 0.69 ng/dL (ref 0.60–1.60)

## 2015-12-21 MED ORDER — LEVOTHYROXINE SODIUM 125 MCG PO TABS: 125.0000 ug | ORAL_TABLET | Freq: Every day | ORAL | 1 refills | Status: DC

## 2015-12-21 MED ORDER — LIOTHYRONINE SODIUM 5 MCG PO TABS: 5.0000 ug | ORAL_TABLET | Freq: Every day | ORAL | 1 refills | Status: DC

## 2015-12-21 NOTE — Patient Instructions (Signed)
Please stop at the lab.  We will change the thyroid hormone doses depending on the lab results.   Please come back in 6 weeks for repeat labs.  Please come back for a follow-up appointment in 6 months.

## 2015-12-21 NOTE — Progress Notes (Signed)
Patient ID: Nancy Hall, female   DOB: [birth year], 45 y.o.   MRN: [id number]   HPI  Nancy Hall is a 45 y.o.-year-old female, initially referred by her PCP, Dr. Deborra Medina, returning for f/u for uncontrolled hypothyroidism. Last visit 6 mo ago.  Pt. has been dx with hypothyroidism in 3382 (45 years old); she is on Levothyroxine 75 >> 100 mcg + Cytomel 25 >> 12.5 mcg daily, taken: - fasting - with water - separated by >30 min from b'fast  - no calcium, iron, multivitamins  - she moved PPI at night  I reviewed pt's thyroid tests: Lab Results  Component Value Date   TSH 2.43 06/05/2015   TSH 7.12 (H) 04/24/2015   TSH 11.180 (H) 11/03/2014   TSH 1.11 10/27/2013   TSH 13.11 (H) 09/14/2013   TSH 0.06 (L) 05/06/2012   TSH 0.04 (L) 05/30/2011   FREET4 0.76 06/05/2015   FREET4 0.50 (L) 04/24/2015   FREET4 0.51 (L) 10/27/2013   FREET4 0.46 (L) 09/14/2013   FREET4 1.00 05/06/2012   FREET4 0.97 05/30/2011    Pt describes: - + weight gain - no fatigue - no cold intolerance; + heat intolerance - + depression, + anxiety - no palpitations - no constipation, no diarrhea - no dry skin - no  hair loss  Pt denies feeling nodules in neck, hoarseness, dysphagia/odynophagia, SOB with lying down.  She has + FH of thyroid disorders in: MGM. No FH of thyroid cancer.  No h/o radiation tx to head or neck. No recent use of iodine supplements.  I reviewed her chart and she also has a history of Bipolar disease, depression, GERD, h/o fibroids, h/o hysterectomy, h/o ear infections, h/o seizures - no sz in last few years.  ROS: Constitutional: See history of present illness Eyes: no blurry vision, no xerophthalmia ENT: no sore throat, no nodules palpated in throat, no dysphagia/odynophagia, no hoarseness Cardiovascular: no CP/SOB/palpitations/leg swelling Respiratory: no cough/SOB Gastrointestinal: no N/V/D/C Musculoskeletal: no muscle/joint aches Skin: no rashes Neurological: no  tremors/numbness/tingling/dizziness   I reviewed pt's medications, allergies, PMH, social hx, family hx, and changes were documented in the history of present illness. Otherwise, unchanged from my initial visit note.  Past Medical History:  Diagnosis Date  . Anemia 2016  . Anxiety   . Bipolar disorder ([place])   . Depression   . Esophagitis    LA Class A  . GERD (gastroesophageal reflux disease)   . Hiatal hernia   . Hypothyroidism   . Learning disability   . Obesity   . Pseudoseizures   . Seizure ([place]) 2014   last seizure 2(two) years ago  . Seizures ([place])    according to echart- seizures vs pseudoseizures  . Sleep apnea   . Thyroid disease    Past Surgical History:  Procedure Laterality Date  . ABDOMINAL HYSTERECTOMY N/A 01/09/2015   Procedure: HYSTERECTOMY ABDOMINAL/BILATERAL SALPINGECTOMY;  Surgeon: Rubie Maid, MD;  Location: [facility] ORS;  Service: Gynecology;  Laterality: N/A;  . DILATION AND CURETTAGE OF UTERUS    . HYSTEROSCOPY W/D&C N/A 12/19/2014   Procedure: DILATATION AND CURETTAGE /HYSTEROSCOPY;  Surgeon: Rubie Maid, MD;  Location: [facility] ORS;  Service: Gynecology;  Laterality: N/A;  . INNER EAR SURGERY Bilateral 1994   poor historian  . TONSILLECTOMY     Social History   Social History  . Marital Status: Single    Spouse Name: N/A  . Number of Children: 0  . Years of Education: 10   Social History Main  Topics  . Smoking status: Never Smoker   . Smokeless tobacco: Never Used  . Alcohol Use: No  . Drug Use: No   Social History Narrative   Patient lives at home with mom.    Patient does not have any children.    Patient has a 10th grade education.    Patient is single.    Patient is left handed.    Does not have a living will or HPOA- full code.   Current Outpatient Prescriptions on File Prior to Visit  Medication Sig Dispense Refill  . ARIPiprazole (ABILIFY) 15 MG tablet Take 1 tablet (15 mg total) by mouth daily. 90 tablet 1  . docusate sodium  (COLACE) 100 MG capsule Take 1 capsule (100 mg total) by mouth 2 (two) times daily as needed for mild constipation. 60 capsule 1  . hydrocortisone cream 0.5 % Apply 1 application topically 2 (two) times daily. 30 g 0  . lamoTRIgine (LAMICTAL) 150 MG tablet TAKE 1 TABLET BY MOUTH 2 TIMES DAILY. 60 tablet 3  . levothyroxine (SYNTHROID, LEVOTHROID) 100 MCG tablet TAKE 1 TABLET (100 MCG TOTAL) BY MOUTH DAILY. 45 tablet 2  . liothyronine (CYTOMEL) 25 MCG tablet TAKE 0.5 TABLETS (12.5 MCG TOTAL) BY MOUTH DAILY. 30 tablet 1  . omeprazole (PRILOSEC) 40 MG capsule TAKE 1 CAPSULE (40 MG TOTAL) BY MOUTH DAILY. 90 capsule 0  . sertraline (ZOLOFT) 100 MG tablet TAKE ONE TABLET BY MOUTH DAILY 30 tablet 2  . topiramate (TOPAMAX) 100 MG tablet TAKE 1 TABLET IN THE MORNING AND 1 & 1/2 TABLETS AT NIGHT 225 tablet 3   No current facility-administered medications on file prior to visit.    Allergies  Allergen Reactions  . Penicillins Rash   Family History  Problem Relation Age of Onset  . Colon polyps Mother   . Celiac disease Mother   . Diabetes Paternal Grandmother   . Heart disease Paternal Grandfather   . Colon polyps Father   . Colon polyps Paternal Aunt   . Celiac disease Brother   . Diabetes Maternal Aunt   . Heart disease Maternal Grandfather   . Heart disease Maternal Uncle   . Heart disease Maternal Aunt   . Diabetes Sister   . Seizures Neg Hx    PE: BP 124/84 (BP Location: Left Arm, Patient Position: Sitting)   Pulse 75   Wt 288 lb (130.6 kg)   LMP 12/19/2014 (Exact Date)   SpO2 95%   BMI 46.48 kg/m  Wt Readings from Last 3 Encounters:  12/21/15 288 lb (130.6 kg)  09/05/15 273 lb (123.8 kg)  07/20/15 277 lb 8 oz (125.9 kg)   Constitutional: Obese, in NAD Eyes: PERRLA, EOMI, no exophthalmos ENT: moist mucous membranes, no thyromegaly, no cervical lymphadenopathy Cardiovascular: RRR, No MRG Respiratory: CTA B Gastrointestinal: abdomen soft, NT, ND, BS+ Musculoskeletal: no  deformities, strength intact in all 4 Skin: moist, warm, no rashes Neurological: no tremor with outstretched hands, no chin tremors, DTR normal in all 4  ASSESSMENT: 1. Hypothyroidism  PLAN:  1. Patient with long-standing hypothyroidism, on levothyroxine (LT4) + liothyronine (LT3) therapy. She appears euthyroid. She does not appear to have a goiter, thyroid nodules, or neck compression symptoms. - at last visit, we decreased the dose of Cytomel to 12.5 g daily and increased the dose of levothyroxine to 100 g daily. Subsequent labs were normal. At this visit, we discussed to try to decrease the Cytomel even more, to aim for a more physiologic regimen.  Her head tremor is resolved after we decreased the Cytomel dose at last visit. However, she gained >10 lbs >> advised to stay active and watch her diet. She agrees to try to decrease the Cytomel >> will change to 5 mcg LT3 + 125 mcg LT4 daily. She will need to come back in 6 weeks for repeat labs. - We discussed about correct intake of levothyroxine, fasting, with water, separated by at least 30 minutes from breakfast, and separated by more than 4 hours from calcium, iron, multivitamins, acid reflux medications (PPIs). She was taking her PPIs along with thyroid medication in the morning, and I advised her to move Protonix at least 4 hours later. She is now taking the thyroid hh correctly. - If these are normal, I will see her back in 6 months  Component     Latest Ref Rng & Units 12/21/2015  T4,Free(Direct)     0.60 - 1.60 ng/dL 0.69  TSH     0.35 - 4.50 uIU/mL 2.89  Triiodothyronine,Free,Serum     2.3 - 4.2 pg/mL 2.8   Thyroid tests are normal. We'll go ahead with the above plan to decrease the dose of liothyronine and increase the dose of levothyroxine. We'll repeat her TFTs in 5-6 weeks.  Philemon Kingdom, MD PhD [facility] Endocrinology

## 2015-12-22 ENCOUNTER — Telehealth: Payer: Self-pay

## 2015-12-22 NOTE — Telephone Encounter (Signed)
Called and left message. Advising of medication changes, lab results and advised of need for lab appointment and gave call back number.

## 2015-12-27 ENCOUNTER — Other Ambulatory Visit: Payer: Self-pay | Admitting: Internal Medicine

## 2016-01-01 ENCOUNTER — Telehealth: Payer: Self-pay

## 2016-01-01 ENCOUNTER — Encounter: Payer: Self-pay | Admitting: Family Medicine

## 2016-01-01 ENCOUNTER — Ambulatory Visit (INDEPENDENT_AMBULATORY_CARE_PROVIDER_SITE_OTHER): Payer: Medicare Other | Admitting: Family Medicine

## 2016-01-01 DIAGNOSIS — R739 Hyperglycemia, unspecified: Secondary | ICD-10-CM

## 2016-01-01 DIAGNOSIS — Z23 Encounter for immunization: Secondary | ICD-10-CM

## 2016-01-01 LAB — HEMOGLOBIN A1C: Hgb A1c MFr Bld: 5.7 % (ref 4.6–6.5)

## 2016-01-01 NOTE — Progress Notes (Signed)
Pre visit review using our clinic review tool, if applicable. No additional management support is needed unless otherwise documented below in the visit note. 

## 2016-01-01 NOTE — Patient Instructions (Signed)
Great to see you. We will call you with your results from today. 

## 2016-01-01 NOTE — Telephone Encounter (Signed)
Her thyroid tests are great, so I doubt that her weight gain is due to the thyroid.  The Cytomel is supposed to help her lose weight, not gain.  Let's wait until the next set of labs - we can recheck them in about a month and adjust the levothyroxine and Cytomel at that time. For now I would not suggest changing them.

## 2016-01-01 NOTE — Telephone Encounter (Signed)
Pt states that her mom wants to know if her liothyronine can be changed to something else since it's making her gain weight.

## 2016-01-01 NOTE — Addendum Note (Signed)
Addended by: Modena Nunnery on: 01/01/2016 08:27 AM   Modules accepted: Orders

## 2016-01-01 NOTE — Assessment & Plan Note (Signed)
>  15 minutes spent in face to face time with patient, >50% spent in counselling or coordination of care Discussing starting a walking program. Agreed to check a1c today . The patient indicates understanding of these issues and agrees with the plan. Orders Placed This Encounter  Procedures  . Hemoglobin A1c

## 2016-01-01 NOTE — Progress Notes (Signed)
Subjective:   Patient ID: Nancy Hall, female    DOB: [birth year], 45 y.o.   MRN: [id number]  Nancy Hall is a pleasant 45 y.o. year old female who presents to clinic today with her sister Follow-up and Weight Gain  on 01/01/2016  HPI:  Weight gain- she is concerned about her weight. Saw Dr. Cruzita Lederer on 12/21/15- note reviewed. They have been adjusting her thyroid medication.  She is sedentary but feels nothing has changed.  Asking for her a1c to be checked today.   Wt Readings from Last 3 Encounters:  01/01/16 285 lb (129.3 kg)  12/21/15 288 lb (130.6 kg)  09/05/15 273 lb (123.8 kg)   Lab Results  Component Value Date   HGBA1C 5.5 10/27/2013    Current Outpatient Prescriptions on File Prior to Visit  Medication Sig Dispense Refill  . ARIPiprazole (ABILIFY) 15 MG tablet Take 1 tablet (15 mg total) by mouth daily. 90 tablet 1  . docusate sodium (COLACE) 100 MG capsule Take 1 capsule (100 mg total) by mouth 2 (two) times daily as needed for mild constipation. 60 capsule 1  . hydrocortisone cream 0.5 % Apply 1 application topically 2 (two) times daily. 30 g 0  . lamoTRIgine (LAMICTAL) 150 MG tablet TAKE 1 TABLET BY MOUTH 2 TIMES DAILY. 60 tablet 3  . levothyroxine (SYNTHROID, LEVOTHROID) 100 MCG tablet TAKE 1 TABLET (100 MCG TOTAL) BY MOUTH DAILY. 45 tablet 2  . levothyroxine (SYNTHROID, LEVOTHROID) 125 MCG tablet Take 1 tablet (125 mcg total) by mouth daily before breakfast. 45 tablet 1  . liothyronine (CYTOMEL) 5 MCG tablet Take 1 tablet (5 mcg total) by mouth daily. 45 tablet 1  . omeprazole (PRILOSEC) 40 MG capsule TAKE 1 CAPSULE (40 MG TOTAL) BY MOUTH DAILY. 90 capsule 0  . sertraline (ZOLOFT) 100 MG tablet TAKE ONE TABLET BY MOUTH DAILY 30 tablet 2  . topiramate (TOPAMAX) 100 MG tablet TAKE 1 TABLET IN THE MORNING AND 1 & 1/2 TABLETS AT NIGHT 225 tablet 3   No current facility-administered medications on file prior to visit.     Allergies  Allergen Reactions  .  Penicillins Rash    Past Medical History:  Diagnosis Date  . Anemia 2016  . Anxiety   . Bipolar disorder ([place])   . Depression   . Esophagitis    LA Class A  . GERD (gastroesophageal reflux disease)   . Hiatal hernia   . Hypothyroidism   . Learning disability   . Obesity   . Pseudoseizures   . Seizure ([place]) 2014   last seizure 2(two) years ago  . Seizures ([place])    according to echart- seizures vs pseudoseizures  . Sleep apnea   . Thyroid disease     Past Surgical History:  Procedure Laterality Date  . ABDOMINAL HYSTERECTOMY N/A 01/09/2015   Procedure: HYSTERECTOMY ABDOMINAL/BILATERAL SALPINGECTOMY;  Surgeon: Rubie Maid, MD;  Location: [facility] ORS;  Service: Gynecology;  Laterality: N/A;  . DILATION AND CURETTAGE OF UTERUS    . HYSTEROSCOPY W/D&C N/A 12/19/2014   Procedure: DILATATION AND CURETTAGE /HYSTEROSCOPY;  Surgeon: Rubie Maid, MD;  Location: [facility] ORS;  Service: Gynecology;  Laterality: N/A;  . INNER EAR SURGERY Bilateral 1994   poor historian  . TONSILLECTOMY      Family History  Problem Relation Age of Onset  . Colon polyps Mother   . Celiac disease Mother   . Diabetes Paternal Grandmother   . Heart disease Paternal Grandfather   . Colon  polyps Father   . Colon polyps Paternal Aunt   . Celiac disease Brother   . Diabetes Maternal Aunt   . Heart disease Maternal Grandfather   . Heart disease Maternal Uncle   . Heart disease Maternal Aunt   . Diabetes Sister   . Seizures Neg Hx     Social History   Social History  . Marital status: Single    Spouse name: N/A  . Number of children: 0  . Years of education: 10   Occupational History  . Not on file.   Social History Main Topics  . Smoking status: Never Smoker  . Smokeless tobacco: Never Used  . Alcohol use No  . Drug use: No  . Sexual activity: No   Other Topics Concern  . Not on file   Social History Narrative   Patient lives at home with mom.    Patient does not have any children.     Patient has a 10th grade education.    Patient is single.    Patient is left handed.    Does not have a living will or HPOA- full code.   The PMH, PSH, Social History, Family History, Medications, and allergies have been reviewed in [facility], and have been updated if relevant.   Review of Systems  Constitutional: Negative.   HENT: Negative.   Endocrine: Negative.   Neurological: Negative.   Hematological: Negative.   All other systems reviewed and are negative.      Objective:    BP 116/64   Pulse 74   Temp 97.7 F (36.5 C) (Oral)   Wt 285 lb (129.3 kg)   LMP 12/19/2014 (Exact Date)   SpO2 100%   BMI 46.00 kg/m    Physical Exam  Constitutional: She is oriented to person, place, and time. She appears well-developed and well-nourished. No distress.  HENT:  Head: Normocephalic.  Eyes: Conjunctivae are normal.  Cardiovascular: Normal rate.   Pulmonary/Chest: Effort normal.  Musculoskeletal: Normal range of motion.  Neurological: She is alert and oriented to person, place, and time. No cranial nerve deficit.  Skin: Skin is warm and dry. She is not diaphoretic.  Psychiatric: She has a normal mood and affect. Her behavior is normal. Judgment and thought content normal.  Nursing note and vitals reviewed.         Assessment & Plan:   Morbid obesity ([place])  Hyperglycemia - Plan: Hemoglobin A1c No Follow-up on file.

## 2016-01-01 NOTE — Telephone Encounter (Signed)
Called and got patient scheduled for labs in a month. Notified her that no change was needed for now.

## 2016-01-01 NOTE — Telephone Encounter (Signed)
She is now followed by Dr. Cruzita Lederer for her thyroid.  Will route to her.

## 2016-01-09 ENCOUNTER — Other Ambulatory Visit: Payer: Self-pay | Admitting: *Deleted

## 2016-01-09 DIAGNOSIS — S59901A Unspecified injury of right elbow, initial encounter: Secondary | ICD-10-CM | POA: Diagnosis not present

## 2016-01-09 DIAGNOSIS — M25521 Pain in right elbow: Secondary | ICD-10-CM | POA: Diagnosis not present

## 2016-01-09 DIAGNOSIS — S5001XA Contusion of right elbow, initial encounter: Secondary | ICD-10-CM | POA: Diagnosis not present

## 2016-01-09 MED ORDER — SERTRALINE HCL 100 MG PO TABS
100.0000 mg | ORAL_TABLET | Freq: Every day | ORAL | 2 refills | Status: DC
Start: 2016-01-09 — End: 2016-10-26

## 2016-02-01 ENCOUNTER — Telehealth: Payer: Self-pay

## 2016-02-01 ENCOUNTER — Other Ambulatory Visit (INDEPENDENT_AMBULATORY_CARE_PROVIDER_SITE_OTHER): Payer: Medicare Other

## 2016-02-01 DIAGNOSIS — E039 Hypothyroidism, unspecified: Secondary | ICD-10-CM

## 2016-02-01 LAB — T4, FREE: Free T4: 0.74 ng/dL (ref 0.60–1.60)

## 2016-02-01 LAB — TSH: TSH: 3.98 u[IU]/mL (ref 0.35–4.50)

## 2016-02-01 LAB — T3, FREE: T3, Free: 3.3 pg/mL (ref 2.3–4.2)

## 2016-02-01 NOTE — Telephone Encounter (Signed)
Called patient and gave lab results. Patient had no questions or concerns.  

## 2016-02-02 ENCOUNTER — Other Ambulatory Visit: Payer: Medicare Other

## 2016-02-08 ENCOUNTER — Ambulatory Visit: Payer: Medicare Other | Admitting: Internal Medicine

## 2016-02-14 ENCOUNTER — Ambulatory Visit (INDEPENDENT_AMBULATORY_CARE_PROVIDER_SITE_OTHER): Payer: Medicare Other | Admitting: Family Medicine

## 2016-02-14 ENCOUNTER — Encounter: Payer: Self-pay | Admitting: Family Medicine

## 2016-02-14 VITALS — BP 126/74 | HR 73 | Temp 97.4°F | Wt 290.0 lb

## 2016-02-14 DIAGNOSIS — J069 Acute upper respiratory infection, unspecified: Secondary | ICD-10-CM | POA: Diagnosis not present

## 2016-02-14 MED ORDER — BENZONATATE 100 MG PO CAPS: 100.0000 mg | ORAL_CAPSULE | Freq: Two times a day (BID) | ORAL | 0 refills | Status: DC | PRN

## 2016-02-14 NOTE — Progress Notes (Signed)
Pre visit review using our clinic review tool, if applicable. No additional management support is needed unless otherwise documented below in the visit note. 

## 2016-02-14 NOTE — Progress Notes (Signed)
SUBJECTIVE:  Nancy Hall is a 45 y.o. female who complains of congestion, sore throat, dry cough and headache for 7 days. She denies a history of anorexia and chest pain and denies a history of asthma. Patient denies smoke cigarettes.   Current Outpatient Prescriptions on File Prior to Visit  Medication Sig Dispense Refill  . ARIPiprazole (ABILIFY) 15 MG tablet Take 1 tablet (15 mg total) by mouth daily. 90 tablet 1  . docusate sodium (COLACE) 100 MG capsule Take 1 capsule (100 mg total) by mouth 2 (two) times daily as needed for mild constipation. 60 capsule 1  . hydrocortisone cream 0.5 % Apply 1 application topically 2 (two) times daily. 30 g 0  . lamoTRIgine (LAMICTAL) 150 MG tablet TAKE 1 TABLET BY MOUTH 2 TIMES DAILY. 60 tablet 3  . levothyroxine (SYNTHROID, LEVOTHROID) 100 MCG tablet TAKE 1 TABLET (100 MCG TOTAL) BY MOUTH DAILY. 45 tablet 2  . levothyroxine (SYNTHROID, LEVOTHROID) 125 MCG tablet Take 1 tablet (125 mcg total) by mouth daily before breakfast. 45 tablet 1  . liothyronine (CYTOMEL) 5 MCG tablet Take 1 tablet (5 mcg total) by mouth daily. 45 tablet 1  . omeprazole (PRILOSEC) 40 MG capsule TAKE 1 CAPSULE (40 MG TOTAL) BY MOUTH DAILY. 90 capsule 0  . sertraline (ZOLOFT) 100 MG tablet Take 1 tablet (100 mg total) by mouth daily. 90 tablet 2  . topiramate (TOPAMAX) 100 MG tablet TAKE 1 TABLET IN THE MORNING AND 1 & 1/2 TABLETS AT NIGHT 225 tablet 3   No current facility-administered medications on file prior to visit.     Allergies  Allergen Reactions  . Penicillins Rash    Past Medical History:  Diagnosis Date  . Anemia 2016  . Anxiety   . Bipolar disorder ([place])   . Depression   . Esophagitis    LA Class A  . GERD (gastroesophageal reflux disease)   . Hiatal hernia   . Hypothyroidism   . Learning disability   . Obesity   . Pseudoseizures   . Seizure ([place]) 2014   last seizure 2(two) years ago  . Seizures ([place])    according to echart- seizures vs  pseudoseizures  . Sleep apnea   . Thyroid disease     Past Surgical History:  Procedure Laterality Date  . ABDOMINAL HYSTERECTOMY N/A 01/09/2015   Procedure: HYSTERECTOMY ABDOMINAL/BILATERAL SALPINGECTOMY;  Surgeon: Rubie Maid, MD;  Location: [facility] ORS;  Service: Gynecology;  Laterality: N/A;  . DILATION AND CURETTAGE OF UTERUS    . HYSTEROSCOPY W/D&C N/A 12/19/2014   Procedure: DILATATION AND CURETTAGE /HYSTEROSCOPY;  Surgeon: Rubie Maid, MD;  Location: [facility] ORS;  Service: Gynecology;  Laterality: N/A;  . INNER EAR SURGERY Bilateral 1994   poor historian  . TONSILLECTOMY      Family History  Problem Relation Age of Onset  . Colon polyps Mother   . Celiac disease Mother   . Diabetes Paternal Grandmother   . Heart disease Paternal Grandfather   . Colon polyps Father   . Colon polyps Paternal Aunt   . Celiac disease Brother   . Diabetes Maternal Aunt   . Heart disease Maternal Grandfather   . Heart disease Maternal Uncle   . Heart disease Maternal Aunt   . Diabetes Sister   . Seizures Neg Hx     Social History   Social History  . Marital status: Single    Spouse name: N/A  . Number of children: 0  . Years of education: 10  Occupational History  . Not on file.   Social History Main Topics  . Smoking status: Never Smoker  . Smokeless tobacco: Never Used  . Alcohol use No  . Drug use: No  . Sexual activity: No   Other Topics Concern  . Not on file   Social History Narrative   Patient lives at home with mom.    Patient does not have any children.    Patient has a 10th grade education.    Patient is single.    Patient is left handed.    Does not have a living will or HPOA- full code.   The PMH, PSH, Social History, Family History, Medications, and allergies have been reviewed in [facility], and have been updated if relevant.  OBJECTIVE: BP 126/74   Pulse 73   Temp 97.4 F (36.3 C) (Oral)   Wt 290 lb (131.5 kg)   LMP 12/19/2014 (Exact Date)   SpO2 99%    BMI 46.81 kg/m   She appears well, vital signs are as noted. Ears normal.  Throat and pharynx normal.  Neck supple. No adenopathy in the neck. Nose is congested. Sinuses non tender. The chest is clear, without wheezes or rales.  ASSESSMENT:  viral upper respiratory illness  PLAN: Symptomatic therapy suggested: push fluids, rest and return office visit prn if symptoms persist or worsen. Lack of antibiotic effectiveness discussed with her. Call or return to clinic prn if these symptoms worsen or fail to improve as anticipated.

## 2016-02-14 NOTE — Patient Instructions (Signed)
Great to see you. You likely a cold virus.  Drink fluids, use cough medicine like tessalon as needed.  Keep me updated.

## 2016-02-15 ENCOUNTER — Encounter: Payer: Medicare Other | Admitting: Obstetrics and Gynecology

## 2016-03-02 ENCOUNTER — Other Ambulatory Visit: Payer: Self-pay | Admitting: Internal Medicine

## 2016-03-02 ENCOUNTER — Other Ambulatory Visit: Payer: Self-pay | Admitting: Family Medicine

## 2016-03-04 ENCOUNTER — Other Ambulatory Visit: Payer: Self-pay | Admitting: Family Medicine

## 2016-03-05 ENCOUNTER — Ambulatory Visit (INDEPENDENT_AMBULATORY_CARE_PROVIDER_SITE_OTHER): Payer: Medicare Other | Admitting: Family Medicine

## 2016-03-05 ENCOUNTER — Encounter: Payer: Self-pay | Admitting: Family Medicine

## 2016-03-05 VITALS — BP 124/90 | HR 70 | Temp 97.7°F | Ht 66.0 in | Wt 289.0 lb

## 2016-03-05 DIAGNOSIS — R509 Fever, unspecified: Secondary | ICD-10-CM | POA: Diagnosis not present

## 2016-03-05 DIAGNOSIS — J069 Acute upper respiratory infection, unspecified: Secondary | ICD-10-CM

## 2016-03-05 LAB — POC INFLUENZA A&B (BINAX/QUICKVUE)
INFLUENZA B, POC: NEGATIVE
Influenza A, POC: NEGATIVE

## 2016-03-05 MED ORDER — BENZONATATE 200 MG PO CAPS: 200.0000 mg | ORAL_CAPSULE | Freq: Two times a day (BID) | ORAL | 0 refills | Status: DC | PRN

## 2016-03-05 NOTE — Patient Instructions (Signed)
Can use benzonatate for cough twice daily   Rest, fluids.  Call if not improving in 7-10 days or shortness breath.

## 2016-03-05 NOTE — Assessment & Plan Note (Signed)
Neg flu. Symptomatic care. Benzonatate sent in for cough prn.

## 2016-03-05 NOTE — Progress Notes (Signed)
   Subjective:    Patient ID: Nancy Hall, female    DOB: 1970/11/01, 45 y.o.   MRN: [id number] Pt with MR. Sister helps with history.  Cough  This is a new problem. The current episode started in the past 7 days. The problem has been gradually worsening. The cough is non-productive. Associated symptoms include a fever, myalgias and a sore throat. Pertinent negatives include no shortness of breath or wheezing. Associated symptoms comments: Hoarse voice  low grade lfever 2 days ago 99.4. The symptoms are aggravated by lying down. Risk factors: nonsmoker. She has tried OTC cough suppressant (tylenol) for the symptoms. The treatment provided mild relief. There is no history of asthma, COPD or environmental allergies.  Fever   Associated symptoms include coughing and a sore throat. Pertinent negatives include no wheezing.   Sick contacts: family   Review of Systems  Constitutional: Positive for fever.  HENT: Positive for sore throat.   Respiratory: Positive for cough. Negative for shortness of breath and wheezing.   Musculoskeletal: Positive for myalgias.  Allergic/Immunologic: Negative for environmental allergies.       Objective:   Physical Exam  Constitutional: Vital signs are normal. She appears well-developed and well-nourished. She is cooperative.  Non-toxic appearance. She does not appear ill. No distress.  HENT:  Head: Normocephalic.  Right Ear: Hearing, tympanic membrane, external ear and ear canal normal. Tympanic membrane is not erythematous, not retracted and not bulging.  Left Ear: Hearing, tympanic membrane, external ear and ear canal normal. Tympanic membrane is not erythematous, not retracted and not bulging.  Nose: Mucosal edema and rhinorrhea present. Right sinus exhibits no maxillary sinus tenderness and no frontal sinus tenderness. Left sinus exhibits no maxillary sinus tenderness and no frontal sinus tenderness.  Mouth/Throat: Uvula is midline, oropharynx is clear  and moist and mucous membranes are normal.  Eyes: Conjunctivae, EOM and lids are normal. Pupils are equal, round, and reactive to light. Lids are everted and swept, no foreign bodies found.  Neck: Trachea normal and normal range of motion. Neck supple. Carotid bruit is not present. No thyroid mass and no thyromegaly present.  Cardiovascular: Normal rate, regular rhythm, S1 normal, S2 normal, normal heart sounds, intact distal pulses and normal pulses.  Exam reveals no gallop and no friction rub.   No murmur heard. Pulmonary/Chest: Effort normal and breath sounds normal. No tachypnea. No respiratory distress. She has no decreased breath sounds. She has no wheezes. She has no rhonchi. She has no rales.  Neurological: She is alert.  Skin: Skin is warm, dry and intact. No rash noted.  Psychiatric: Her speech is normal and behavior is normal. Judgment normal. Her mood appears not anxious. Cognition and memory are normal. She does not exhibit a depressed mood.          Assessment & Plan:

## 2016-03-05 NOTE — Addendum Note (Signed)
Addended by: Carter Kitten on: 03/05/2016 11:49 AM   Modules accepted: Orders

## 2016-03-05 NOTE — Progress Notes (Signed)
Pre visit review using our clinic review tool, if applicable. No additional management support is needed unless otherwise documented below in the visit note. 

## 2016-03-15 ENCOUNTER — Other Ambulatory Visit: Payer: Self-pay | Admitting: Internal Medicine

## 2016-03-21 DIAGNOSIS — H903 Sensorineural hearing loss, bilateral: Secondary | ICD-10-CM | POA: Diagnosis not present

## 2016-03-21 DIAGNOSIS — H6983 Other specified disorders of Eustachian tube, bilateral: Secondary | ICD-10-CM | POA: Diagnosis not present

## 2016-05-16 ENCOUNTER — Other Ambulatory Visit: Payer: Self-pay | Admitting: Internal Medicine

## 2016-06-02 ENCOUNTER — Other Ambulatory Visit: Payer: Self-pay | Admitting: Family Medicine

## 2016-06-16 ENCOUNTER — Other Ambulatory Visit: Payer: Self-pay | Admitting: Internal Medicine

## 2016-06-19 ENCOUNTER — Ambulatory Visit: Payer: Medicare Other | Admitting: Internal Medicine

## 2016-07-10 ENCOUNTER — Ambulatory Visit (INDEPENDENT_AMBULATORY_CARE_PROVIDER_SITE_OTHER): Payer: Medicare Other | Admitting: Family Medicine

## 2016-07-10 VITALS — BP 130/84 | HR 80 | Wt 290.0 lb

## 2016-07-10 DIAGNOSIS — M5430 Sciatica, unspecified side: Secondary | ICD-10-CM

## 2016-07-10 DIAGNOSIS — M79605 Pain in left leg: Secondary | ICD-10-CM | POA: Diagnosis not present

## 2016-07-10 DIAGNOSIS — E039 Hypothyroidism, unspecified: Secondary | ICD-10-CM | POA: Diagnosis not present

## 2016-07-10 LAB — COMPREHENSIVE METABOLIC PANEL
ALT: 15 U/L (ref 0–35)
AST: 11 U/L (ref 0–37)
Albumin: 4.3 g/dL (ref 3.5–5.2)
Alkaline Phosphatase: 90 U/L (ref 39–117)
BUN: 12 mg/dL (ref 6–23)
CALCIUM: 9.1 mg/dL (ref 8.4–10.5)
CHLORIDE: 105 meq/L (ref 96–112)
CO2: 24 meq/L (ref 19–32)
CREATININE: 0.97 mg/dL (ref 0.40–1.20)
GFR: 66.38 mL/min (ref >60.00)
Glucose, Bld: 114 mg/dL — ABNORMAL HIGH (ref 70–99)
Potassium: 3.8 mEq/L (ref 3.5–5.1)
Sodium: 137 mEq/L (ref 135–145)
Total Bilirubin: 0.4 mg/dL (ref 0.2–1.2)
Total Protein: 7 g/dL (ref 6.0–8.3)

## 2016-07-10 LAB — HEMOGLOBIN A1C: Hgb A1c MFr Bld: 5.9 % (ref 4.6–6.5)

## 2016-07-10 MED ORDER — MELOXICAM 7.5 MG PO TABS: 7.5000 mg | ORAL_TABLET | Freq: Every day | ORAL | 0 refills | Status: DC

## 2016-07-10 NOTE — Progress Notes (Signed)
Subjective:   Patient ID: [name], female    DOB: November 16, 1970, 46 y.o.   MRN: [id number]  Nancy Hall is a pleasant 46 y.o. year old female who presents to clinic today with Leg Pain ( Left thigh to calf x weeks, hurts to walk)  on 07/10/2016  HPI:  Starts in left lower back.  Worse when she stands up and sits up. Rain radiates to lower leg/sometimes ankle.  No severe pain, more a "dull tooth ache."  No urinary symptoms.  No LE weakness.  No known injury.  Current Outpatient Prescriptions on File Prior to Visit  Medication Sig Dispense Refill  . ARIPiprazole (ABILIFY) 15 MG tablet TAKE 1 TABLET (15 MG TOTAL) BY MOUTH DAILY. 90 tablet 1  . benzonatate (TESSALON) 200 MG capsule Take 1 capsule (200 mg total) by mouth 2 (two) times daily as needed for cough. 20 capsule 0  . docusate sodium (COLACE) 100 MG capsule Take 1 capsule (100 mg total) by mouth 2 (two) times daily as needed for mild constipation. 60 capsule 1  . hydrocortisone cream 0.5 % Apply 1 application topically 2 (two) times daily. 30 g 0  . lamoTRIgine (LAMICTAL) 150 MG tablet TAKE 1 TABLET BY MOUTH 2 TIMES DAILY. 60 tablet 3  . levothyroxine (SYNTHROID, LEVOTHROID) 100 MCG tablet TAKE 1 TABLET (100 MCG TOTAL) BY MOUTH DAILY. 45 tablet 2  . levothyroxine (SYNTHROID, LEVOTHROID) 125 MCG tablet TAKE 1 TABLET (125 MCG TOTAL) BY MOUTH DAILY BEFORE BREAKFAST. 45 tablet 1  . liothyronine (CYTOMEL) 25 MCG tablet TAKE 0.5 TABLETS (12.5 MCG TOTAL) BY MOUTH DAILY. 30 tablet 1  . liothyronine (CYTOMEL) 5 MCG tablet TAKE 1 TABLET (5 MCG TOTAL) BY MOUTH DAILY. 45 tablet 1  . omeprazole (PRILOSEC) 40 MG capsule TAKE 1 CAPSULE (40 MG TOTAL) BY MOUTH DAILY. 90 capsule 0  . sertraline (ZOLOFT) 100 MG tablet Take 1 tablet (100 mg total) by mouth daily. 90 tablet 2  . topiramate (TOPAMAX) 100 MG tablet TAKE 1 TABLET IN THE MORNING AND 1 & 1/2 TABLETS AT NIGHT 225 tablet 3   No current facility-administered medications on file  prior to visit.     Allergies  Allergen Reactions  . Penicillins Rash    Past Medical History:  Diagnosis Date  . Anemia 2016  . Anxiety   . Bipolar disorder ([place])   . Depression   . Esophagitis    LA Class A  . GERD (gastroesophageal reflux disease)   . Hiatal hernia   . Hypothyroidism   . Learning disability   . Obesity   . Pseudoseizures   . Seizure ([place]) 2014   last seizure 2(two) years ago  . Seizures ([place])    according to echart- seizures vs pseudoseizures  . Sleep apnea   . Thyroid disease     Past Surgical History:  Procedure Laterality Date  . ABDOMINAL HYSTERECTOMY N/A 01/09/2015   Procedure: HYSTERECTOMY ABDOMINAL/BILATERAL SALPINGECTOMY;  Surgeon: Rubie Maid, MD;  Location: [facility] ORS;  Service: Gynecology;  Laterality: N/A;  . DILATION AND CURETTAGE OF UTERUS    . HYSTEROSCOPY W/D&C N/A 12/19/2014   Procedure: DILATATION AND CURETTAGE /HYSTEROSCOPY;  Surgeon: Rubie Maid, MD;  Location: [facility] ORS;  Service: Gynecology;  Laterality: N/A;  . INNER EAR SURGERY Bilateral 1994   poor historian  . TONSILLECTOMY      Family History  Problem Relation Age of Onset  . Colon polyps Mother   . Celiac disease Mother   .  Diabetes Paternal Grandmother   . Heart disease Paternal Grandfather   . Colon polyps Father   . Colon polyps Paternal Aunt   . Celiac disease Brother   . Diabetes Maternal Aunt   . Heart disease Maternal Grandfather   . Heart disease Maternal Uncle   . Heart disease Maternal Aunt   . Diabetes Sister   . Seizures Neg Hx     Social History   Social History  . Marital status: Single    Spouse name: N/A  . Number of children: 0  . Years of education: 10   Occupational History  . Not on file.   Social History Main Topics  . Smoking status: Never Smoker  . Smokeless tobacco: Never Used  . Alcohol use No  . Drug use: No  . Sexual activity: No   Other Topics Concern  . Not on file   Social History Narrative   Patient lives at  home with mom.    Patient does not have any children.    Patient has a 10th grade education.    Patient is single.    Patient is left handed.    Does not have a living will or HPOA- full code.   The PMH, PSH, Social History, Family History, Medications, and allergies have been reviewed in [facility], and have been updated if relevant.  Review of Systems  Musculoskeletal: Positive for arthralgias and back pain.  Neurological: Negative for dizziness, weakness and numbness.  All other systems reviewed and are negative.      Objective:    BP 130/84   Pulse 80   Wt 290 lb (131.5 kg)   LMP 12/19/2014 (Exact Date)   SpO2 99%   BMI 46.81 kg/m    Physical Exam  General:  Well-developed,well-nourished,in no acute distress; alert,appropriate and cooperative throughout examination Head:  normocephalic and atraumatic.   Eyes:  vision grossly intact, PERRL Lungs:  Normal respiratory effort, chest expands symmetrically. Lungs are clear to auscultation, no crackles or wheezes. Heart:  Normal rate and regular rhythm. S1 and S2 normal without gallop, murmur, click, rub or other extra sounds. Abdomen:  Bowel sounds positive,abdomen soft and non-tender without masses, organomegaly or hernias noted. Msk:  No deformity or scoliosis noted of thoracic or lumbar spine.   Extremities:  No clubbing, cyanosis, edema, or deformity noted with normal full range of motion of all joints.   SLR mildly positive left Neurologic:  alert & oriented X3 and gait normal.   Axillary Nodes:  No palpable lymphadenopathy Psych:  Cognition and judgment appear intact. Alert and cooperative with normal attention span and concentration. No apparent delusions, illusions, hallucinations        Assessment & Plan:   No diagnosis found. No Follow-up on file.

## 2016-07-10 NOTE — Patient Instructions (Signed)
Great to see you. Please take meloxicam 7. 5 mg daily with food for the next few weeks.  Please keep me updated.

## 2016-07-11 DIAGNOSIS — M79605 Pain in left leg: Secondary | ICD-10-CM | POA: Insufficient documentation

## 2016-07-11 NOTE — Assessment & Plan Note (Signed)
Consistent with sciatica. eRx sent for mobic. Advised to take daily with food for 2 weeks and to update me. The patient indicates understanding of these issues and agrees with the plan.

## 2016-08-06 ENCOUNTER — Other Ambulatory Visit: Payer: Self-pay | Admitting: Family Medicine

## 2016-08-06 ENCOUNTER — Telehealth: Payer: Self-pay

## 2016-08-06 NOTE — Telephone Encounter (Signed)
Spoke to pt. Made appt 08-12-16

## 2016-08-06 NOTE — Telephone Encounter (Signed)
Let's have her come back in to see me.

## 2016-08-06 NOTE — Telephone Encounter (Signed)
Pt having lt leg pain which continues; pt seen 07/10/16; should pt continue to take the meloxicam or what to do? Pt request cb . [facility].

## 2016-08-12 ENCOUNTER — Ambulatory Visit (INDEPENDENT_AMBULATORY_CARE_PROVIDER_SITE_OTHER): Payer: Medicare Other | Admitting: Family Medicine

## 2016-08-12 ENCOUNTER — Encounter: Payer: Self-pay | Admitting: Family Medicine

## 2016-08-12 DIAGNOSIS — L03116 Cellulitis of left lower limb: Secondary | ICD-10-CM | POA: Diagnosis not present

## 2016-08-12 DIAGNOSIS — M79605 Pain in left leg: Secondary | ICD-10-CM

## 2016-08-12 DIAGNOSIS — L0390 Cellulitis, unspecified: Secondary | ICD-10-CM | POA: Insufficient documentation

## 2016-08-12 DIAGNOSIS — M79604 Pain in right leg: Secondary | ICD-10-CM

## 2016-08-12 MED ORDER — GABAPENTIN 300 MG PO CAPS: 300.0000 mg | ORAL_CAPSULE | Freq: Every day | ORAL | 3 refills | Status: DC

## 2016-08-12 MED ORDER — DOXYCYCLINE HYCLATE 100 MG PO TABS: 100.0000 mg | ORAL_TABLET | Freq: Two times a day (BID) | ORAL | 0 refills | Status: DC

## 2016-08-12 NOTE — Assessment & Plan Note (Signed)
Persistent without much relief from NSAID. eRX sent for gabapentin 300 mg qhs. Call or return to clinic prn if these symptoms worsen or fail to improve as anticipated. The patient indicates understanding of these issues and agrees with the plan.

## 2016-08-12 NOTE — Assessment & Plan Note (Signed)
New- treat with 7 day course of abx. Advised to wear shoes that are open and loose until sores heal.  Keep covered while shoes are on. Call or return to clinic prn if these symptoms worsen or fail to improve as anticipated. The patient indicates understanding of these issues and agrees with the plan.

## 2016-08-12 NOTE — Patient Instructions (Signed)
Great to see you.  We are putting you on doxycycline - 1 tablet twice daily for 7 days.  Let's start gabapentin 300 mg at bedtime.  Please update in about a week.

## 2016-08-12 NOTE — Progress Notes (Signed)
Subjective:   Patient ID: Nancy Hall, female    DOB: [birth year], 46 y.o.   MRN: [id number]  Nancy Hall is a pleasant 46 y.o. year old female pleasant 46 y.o. year old female who presents to clinic today with Leg Pain and Foot Pain (left, pain when walking, swollen)  on 08/12/2016  HPI:  Left foot pain- has had dry feet on the top of the foot for months but now she has had pain, redness and pain on the top of her foot, "little ulcers."  No drainage from the wounds, fevers or chills.  Was wearing shoes that were too small this weekend.  Noticed the swelling, pain and sores after she took the shoes off.  Still having some leg pain that didn't really improve with mobic- see last OV- hip and upper leg pain.  Current Outpatient Prescriptions on File Prior to Visit  Medication Sig Dispense Refill  . ARIPiprazole (ABILIFY) 15 MG tablet TAKE 1 TABLET (15 MG TOTAL) BY MOUTH DAILY. 90 tablet 1  . benzonatate (TESSALON) 200 MG capsule Take 1 capsule (200 mg total) by mouth 2 (two) times daily as needed for cough. 20 capsule 0  . docusate sodium (COLACE) 100 MG capsule Take 1 capsule (100 mg total) by mouth 2 (two) times daily as needed for mild constipation. 60 capsule 1  . hydrocortisone cream 0.5 % Apply 1 application topically 2 (two) times daily. 30 g 0  . lamoTRIgine (LAMICTAL) 150 MG tablet TAKE 1 TABLET BY MOUTH 2 TIMES DAILY. 60 tablet 3  . levothyroxine (SYNTHROID, LEVOTHROID) 100 MCG tablet TAKE 1 TABLET (100 MCG TOTAL) BY MOUTH DAILY. 45 tablet 2  . levothyroxine (SYNTHROID, LEVOTHROID) 125 MCG tablet TAKE 1 TABLET (125 MCG TOTAL) BY MOUTH DAILY BEFORE BREAKFAST. 45 tablet 1  . liothyronine (CYTOMEL) 25 MCG tablet TAKE 0.5 TABLETS (12.5 MCG TOTAL) BY MOUTH DAILY. 30 tablet 1  . liothyronine (CYTOMEL) 5 MCG tablet TAKE 1 TABLET (5 MCG TOTAL) BY MOUTH DAILY. 45 tablet 1  . meloxicam (MOBIC) 7.5 MG tablet TAKE 1 TABLET BY MOUTH EVERY DAY 30 tablet 0  . omeprazole (PRILOSEC) 40 MG capsule TAKE 1 CAPSULE (40 MG  TOTAL) BY MOUTH DAILY. 90 capsule 0  . sertraline (ZOLOFT) 100 MG tablet Take 1 tablet (100 mg total) by mouth daily. 90 tablet 2  . topiramate (TOPAMAX) 100 MG tablet TAKE 1 TABLET IN THE MORNING AND 1 & 1/2 TABLETS AT NIGHT 225 tablet 3   No current facility-administered medications on file prior to visit.     Allergies  Allergen Reactions  . Penicillins Rash    Past Medical History:  Diagnosis Date  . Anemia 2016  . Anxiety   . Bipolar disorder ([place])   . Depression   . Esophagitis    LA Class A  . GERD (gastroesophageal reflux disease)   . Hiatal hernia   . Hypothyroidism   . Learning disability   . Obesity   . Pseudoseizures   . Seizure ([place]) 2014   last seizure 2(two) years ago  . Seizures ([place])    according to echart- seizures vs pseudoseizures  . Sleep apnea   . Thyroid disease     Past Surgical History:  Procedure Laterality Date  . ABDOMINAL HYSTERECTOMY N/A 01/09/2015   Procedure: HYSTERECTOMY ABDOMINAL/BILATERAL SALPINGECTOMY;  Surgeon: Rubie Maid, MD;  Location: [facility] ORS;  Service: Gynecology;  Laterality: N/A;  . DILATION AND CURETTAGE OF UTERUS    . HYSTEROSCOPY W/D&C N/A 12/19/2014   Procedure: DILATATION AND  CURETTAGE /HYSTEROSCOPY;  Surgeon: Rubie Maid, MD;  Location: [facility] ORS;  Service: Gynecology;  Laterality: N/A;  . INNER EAR SURGERY Bilateral 1994   poor historian  . TONSILLECTOMY      Family History  Problem Relation Age of Onset  . Colon polyps Mother   . Celiac disease Mother   . Diabetes Paternal Grandmother   . Heart disease Paternal Grandfather   . Colon polyps Father   . Colon polyps Paternal Aunt   . Celiac disease Brother   . Diabetes Maternal Aunt   . Heart disease Maternal Grandfather   . Heart disease Maternal Uncle   . Heart disease Maternal Aunt   . Diabetes Sister   . Seizures Neg Hx     Social History   Social History  . Marital status: Single    Spouse name: N/A  . Number of children: 0  . Years of  education: 10   Occupational History  . Not on file.   Social History Main Topics  . Smoking status: Never Smoker  . Smokeless tobacco: Never Used  . Alcohol use No  . Drug use: No  . Sexual activity: No   Other Topics Concern  . Not on file   Social History Narrative   Patient lives at home with mom.    Patient does not have any children.    Patient has a 10th grade education.    Patient is single.    Patient is left handed.    Does not have a living will or HPOA- full code.   The PMH, PSH, Social History, Family History, Medications, and allergies have been reviewed in [facility], and have been updated if relevant.   Review of Systems  Constitutional: Negative.   Musculoskeletal: Positive for arthralgias.  Skin: Positive for wound. Negative for rash.  All other systems reviewed and are negative.      Objective:    BP 118/82   Pulse 89   Temp 98.1 F (36.7 C)   Wt 293 lb (132.9 kg)   LMP 12/19/2014 (Exact Date)   SpO2 100%   BMI 47.29 kg/m    Physical Exam  Constitutional: She is oriented to person, place, and time. She appears well-developed and well-nourished. No distress.  HENT:  Head: Normocephalic and atraumatic.  Cardiovascular: Normal rate.   Pulmonary/Chest: Effort normal.  Neurological: She is alert and oriented to person, place, and time. No cranial nerve deficit. Coordination normal.  Skin: She is not diaphoretic.     Psychiatric: She has a normal mood and affect. Her behavior is normal. Judgment and thought content normal.  Nursing note and vitals reviewed.         Assessment & Plan:   Cellulitis of left lower extremity No Follow-up on file.

## 2016-08-19 ENCOUNTER — Telehealth: Payer: Self-pay | Admitting: *Deleted

## 2016-08-19 MED ORDER — CLOTRIMAZOLE-BETAMETHASONE 1-0.05 % EX CREA: 1.0000 "application " | TOPICAL_CREAM | Freq: Two times a day (BID) | CUTANEOUS | 0 refills | Status: DC

## 2016-08-19 NOTE — Telephone Encounter (Signed)
Noted.  eRx sent for topical steroid/Antifungal.  Please keep me updated.

## 2016-08-19 NOTE — Telephone Encounter (Signed)
Spoke to pt who states she recently seen in office and advised to contact Dr Deborra Medina with an update today. Pt is still experiencing itching and swelling and is requesting an Rx or suggestions of an OTC cream she may use for itching. pls advise

## 2016-08-19 NOTE — Telephone Encounter (Signed)
Pt was notified, pt verbalized understanding.   

## 2016-08-19 NOTE — Telephone Encounter (Signed)
Itching on her foot?  Are the lesions not healing?

## 2016-08-19 NOTE — Telephone Encounter (Signed)
Spoke with patient she said the rash and itching still present on L foot, no swelling, drainage, chills or fever. The lesions on her her foot "are not healing".

## 2016-08-20 ENCOUNTER — Telehealth: Payer: Self-pay

## 2016-08-20 MED ORDER — CLOTRIMAZOLE 1 % EX CREA: 1.0000 "application " | TOPICAL_CREAM | Freq: Two times a day (BID) | CUTANEOUS | 0 refills | Status: DC

## 2016-08-20 NOTE — Telephone Encounter (Signed)
Pt left v/m; clotrimazole-Betamethasone was not covered by ins and pt request less expensive med to [place] or suggest OTC med. Pt request cb.

## 2016-08-20 NOTE — Telephone Encounter (Signed)
Pt was notified.  

## 2016-08-20 NOTE — Telephone Encounter (Signed)
Noted.  eRx for clortrimazole sent to pharmacy.

## 2016-08-23 ENCOUNTER — Telehealth: Payer: Self-pay

## 2016-08-23 ENCOUNTER — Encounter: Payer: Self-pay | Admitting: Internal Medicine

## 2016-08-23 ENCOUNTER — Ambulatory Visit (INDEPENDENT_AMBULATORY_CARE_PROVIDER_SITE_OTHER): Payer: Medicare Other | Admitting: Internal Medicine

## 2016-08-23 VITALS — BP 122/82 | HR 82 | Wt 292.0 lb

## 2016-08-23 DIAGNOSIS — E039 Hypothyroidism, unspecified: Secondary | ICD-10-CM | POA: Diagnosis not present

## 2016-08-23 LAB — T3, FREE: T3 FREE: 2.8 pg/mL (ref 2.3–4.2)

## 2016-08-23 LAB — T4, FREE: Free T4: 0.92 ng/dL (ref 0.60–1.60)

## 2016-08-23 LAB — TSH: TSH: 0.81 u[IU]/mL (ref 0.35–4.50)

## 2016-08-23 MED ORDER — LEVOTHYROXINE SODIUM 137 MCG PO TABS: 125.0000 ug | ORAL_TABLET | Freq: Every day | ORAL | 5 refills | Status: DC

## 2016-08-23 NOTE — Telephone Encounter (Signed)
Patient called in stated she had the dosages of the medications from her appointment. I noticed that Dr.Gherghe had sent a mychart messge regarding these medications. Advised of results, and changes in medications. Patient understood and had no other questions at this time.

## 2016-08-23 NOTE — Patient Instructions (Signed)
Please stop at the lab.  Continue Levothyroxine 125 mcg + Cytomel 5 mcg daily.  Take the thyroid hormone every day, with water, at least 30 minutes before breakfast, separated by at least 4 hours from: - acid reflux medications - calcium - iron - multivitamins  Look into: [facility] Address: [address] [place], [place], [place] [address]  Phone: [phone]  Please come back for a follow-up appointment in 6 months

## 2016-08-23 NOTE — Progress Notes (Signed)
Patient ID: Nancy Hall, female   DOB: 23-Jul-1970, 46 y.o.   MRN: [id number]   HPI  Nancy Hall is a 46 y.o.-year-old female, returning for f/u for uncontrolled hypothyroidism. Last visit 6 mo ago. He is here with her sister who offers part of the hx.  Pt. has been dx with hypothyroidism in 4861 (46 years old); she is on Levothyroxine 75 >> then started LT5 100 mcg + Cytomel 25  Daily. We have been trying to reduce her Cytomel >> now on LT4 125 mcg + LT3 5 mcg daily  She takes the Levothyroxine: - in am - fasting - at least 30 min from b'fast - no Ca, Fe, MVI - on PPIs at night - not on Biotin  I reviewed pt's thyroid tests: Lab Results  Component Value Date   TSH 3.98 02/01/2016   TSH 2.89 12/21/2015   TSH 2.43 06/05/2015   TSH 7.12 (H) 04/24/2015   TSH 11.180 (H) 11/03/2014   TSH 1.11 10/27/2013   TSH 13.11 (H) 09/14/2013   TSH 0.06 (L) 05/06/2012   TSH 0.04 (L) 05/30/2011   FREET4 0.74 02/01/2016   FREET4 0.69 12/21/2015   FREET4 0.76 06/05/2015   FREET4 0.50 (L) 04/24/2015   FREET4 0.51 (L) 10/27/2013   FREET4 0.46 (L) 09/14/2013   FREET4 1.00 05/06/2012   FREET4 0.97 05/30/2011    Pt denies: - feeling nodules in neck - hoarseness - dysphagia - choking - SOB with lying down  She has + FH of thyroid disorders in: MGM. No FH of thyroid cancer. No h/o radiation tx to head or neck.  No seaweed or kelp. No recent contrast studies. No herbal supplements. No Biotin use. No recent steroids use.   I reviewed her chart and she also has a history of Bipolar disease, depression, GERD, h/o fibroids, h/o hysterectomy, h/o ear infections, h/o seizures.  ROS: Constitutional: + weight gain, no fatigue, no subjective hyperthermia, no subjective hypothermia Eyes: no blurry vision, no xerophthalmia ENT: no sore throat, no nodules palpated in throat, no dysphagia, no odynophagia, no hoarseness Cardiovascular: no CP/no SOB/no palpitations/+ leg swelling Respiratory: no  cough/no SOB/no wheezing Gastrointestinal: no N/no V/no D/no C/no acid reflux Musculoskeletal: no muscle aches/no joint aches Skin: + rash on L foot + onychodystrophy L foot, no hair loss Neurological: no tremors/no numbness/no tingling/no dizziness  I reviewed pt's medications, allergies, PMH, social hx, family hx, and changes were documented in the history of present illness. Otherwise, unchanged from my initial visit note.   Past Medical History:  Diagnosis Date  . Anemia 2016  . Anxiety   . Bipolar disorder ([place])   . Depression   . Esophagitis    LA Class A  . GERD (gastroesophageal reflux disease)   . Hiatal hernia   . Hypothyroidism   . Learning disability   . Obesity   . Pseudoseizures   . Seizure ([place]) 2014   last seizure 2(two) years ago  . Seizures ([place])    according to echart- seizures vs pseudoseizures  . Sleep apnea   . Thyroid disease    Past Surgical History:  Procedure Laterality Date  . ABDOMINAL HYSTERECTOMY N/A 01/09/2015   Procedure: HYSTERECTOMY ABDOMINAL/BILATERAL SALPINGECTOMY;  Surgeon: Rubie Maid, MD;  Location: [facility] ORS;  Service: Gynecology;  Laterality: N/A;  . DILATION AND CURETTAGE OF UTERUS    . HYSTEROSCOPY W/D&C N/A 12/19/2014   Procedure: DILATATION AND CURETTAGE /HYSTEROSCOPY;  Surgeon: Rubie Maid, MD;  Location: [facility] ORS;  Service:  Gynecology;  Laterality: N/A;  . INNER EAR SURGERY Bilateral 1994   poor historian  . TONSILLECTOMY     Social History   Social History  . Marital Status: Single    Spouse Name: N/A  . Number of Children: 0  . Years of Education: 10   Social History Main Topics  . Smoking status: Never Smoker   . Smokeless tobacco: Never Used  . Alcohol Use: No  . Drug Use: No   Social History Narrative   Patient lives at home with mom.    Patient does not have any children.    Patient has a 10th grade education.    Patient is single.    Patient is left handed.    Does not have a living will or HPOA- full  code.   Current Outpatient Prescriptions on File Prior to Visit  Medication Sig Dispense Refill  . ARIPiprazole (ABILIFY) 15 MG tablet TAKE 1 TABLET (15 MG TOTAL) BY MOUTH DAILY. 90 tablet 1  . benzonatate (TESSALON) 200 MG capsule Take 1 capsule (200 mg total) by mouth 2 (two) times daily as needed for cough. 20 capsule 0  . clotrimazole (LOTRIMIN) 1 % cream Apply 1 application topically 2 (two) times daily. 30 g 0  . docusate sodium (COLACE) 100 MG capsule Take 1 capsule (100 mg total) by mouth 2 (two) times daily as needed for mild constipation. 60 capsule 1  . doxycycline (VIBRA-TABS) 100 MG tablet Take 1 tablet (100 mg total) by mouth 2 (two) times daily. 14 tablet 0  . gabapentin (NEURONTIN) 300 MG capsule Take 1 capsule (300 mg total) by mouth at bedtime. 30 capsule 3  . hydrocortisone cream 0.5 % Apply 1 application topically 2 (two) times daily. 30 g 0  . lamoTRIgine (LAMICTAL) 150 MG tablet TAKE 1 TABLET BY MOUTH 2 TIMES DAILY. 60 tablet 3  . levothyroxine (SYNTHROID, LEVOTHROID) 100 MCG tablet TAKE 1 TABLET (100 MCG TOTAL) BY MOUTH DAILY. 45 tablet 2  . levothyroxine (SYNTHROID, LEVOTHROID) 125 MCG tablet TAKE 1 TABLET (125 MCG TOTAL) BY MOUTH DAILY BEFORE BREAKFAST. 45 tablet 1  . liothyronine (CYTOMEL) 25 MCG tablet TAKE 0.5 TABLETS (12.5 MCG TOTAL) BY MOUTH DAILY. 30 tablet 1  . liothyronine (CYTOMEL) 5 MCG tablet TAKE 1 TABLET (5 MCG TOTAL) BY MOUTH DAILY. 45 tablet 1  . meloxicam (MOBIC) 7.5 MG tablet TAKE 1 TABLET BY MOUTH EVERY DAY 30 tablet 0  . omeprazole (PRILOSEC) 40 MG capsule TAKE 1 CAPSULE (40 MG TOTAL) BY MOUTH DAILY. 90 capsule 0  . sertraline (ZOLOFT) 100 MG tablet Take 1 tablet (100 mg total) by mouth daily. 90 tablet 2  . topiramate (TOPAMAX) 100 MG tablet TAKE 1 TABLET IN THE MORNING AND 1 & 1/2 TABLETS AT NIGHT 225 tablet 3   No current facility-administered medications on file prior to visit.    Allergies  Allergen Reactions  . Penicillins Rash   Family  History  Problem Relation Age of Onset  . Colon polyps Mother   . Celiac disease Mother   . Diabetes Paternal Grandmother   . Heart disease Paternal Grandfather   . Colon polyps Father   . Colon polyps Paternal Aunt   . Celiac disease Brother   . Diabetes Maternal Aunt   . Heart disease Maternal Grandfather   . Heart disease Maternal Uncle   . Heart disease Maternal Aunt   . Diabetes Sister   . Seizures Neg Hx    PE: BP 122/82 (BP  Location: Left Arm, Patient Position: Sitting)   Pulse 82   Wt 292 lb (132.5 kg)   LMP 12/19/2014 (Exact Date)   SpO2 98%   BMI 47.13 kg/m  Wt Readings from Last 3 Encounters:  08/23/16 292 lb (132.5 kg)  08/12/16 293 lb (132.9 kg)  07/10/16 290 lb (131.5 kg)   Constitutional: obese, in NAD Eyes: PERRLA, EOMI, no exophthalmos ENT: moist mucous membranes, no thyromegaly, no cervical lymphadenopathy Cardiovascular: RRR, No MRG Respiratory: CTA B Gastrointestinal: abdomen soft, NT, ND, BS+ Musculoskeletal: no deformities, strength intact in all 4 Skin: moist, warm, no rashes Neurological: no tremor with outstretched hands, DTR normal in all 4  ASSESSMENT: 1. Hypothyroidism  2. Obesity  PLAN:  1. Patient with long-standing hypothyroidism, on levothyroxine (LT4) + liothyronine (LT3) therapy. We have been decreasing her LT3 dose, as this was started at a rather high dose in the past. Head tremor improved after decreasing Cytomel. - latest thyroid labs reviewed with pt >> normal  - she continues on LT4 125 mcg + LT3 5 mcg daily - pt feels good on this dose. - we discussed about taking the thyroid hormone every day, with water, >30 minutes before breakfast, separated by >4 hours from acid reflux medications, calcium, iron, multivitamins. Pt. is taking it correctly - will check thyroid tests today: TSH, free T4 and free T3. Plan to stop Cytomel and increase LT4 after results are back. She agrees with this. - If labs are abnormal, she will need to  return for repeat TFTs in 1.5 months - OTW, RTC in 6 mo  2. Obesity - BMI 47 - recommended [facility] - given ref  Component     Latest Ref Rng & Units 08/23/2016  TSH     0.35 - 4.50 uIU/mL 0.81  T4,Free(Direct)     0.60 - 1.60 ng/dL 0.92  Triiodothyronine,Free,Serum     2.3 - 4.2 pg/mL 2.8  Thyroid labs are normal. At this point, I would suggest to increase her levothyroxine to 137 g daily and stop the Cytomel. We will repeat her labs in 2 months.  Philemon Kingdom, MD PhD [facility]

## 2016-08-30 ENCOUNTER — Other Ambulatory Visit: Payer: Self-pay | Admitting: Family Medicine

## 2016-08-31 ENCOUNTER — Other Ambulatory Visit: Payer: Self-pay | Admitting: Family Medicine

## 2016-09-01 ENCOUNTER — Other Ambulatory Visit: Payer: Self-pay | Admitting: Family Medicine

## 2016-09-04 ENCOUNTER — Ambulatory Visit: Payer: Medicare Other | Admitting: Nurse Practitioner

## 2016-09-08 ENCOUNTER — Other Ambulatory Visit: Payer: Self-pay | Admitting: Family Medicine

## 2016-09-09 NOTE — Telephone Encounter (Signed)
Last refill 08/06/16  Last OV 08/12/16 Ok to refill?

## 2016-09-13 ENCOUNTER — Other Ambulatory Visit: Payer: Self-pay | Admitting: Internal Medicine

## 2016-09-13 ENCOUNTER — Other Ambulatory Visit: Payer: Self-pay | Admitting: Nurse Practitioner

## 2016-09-19 ENCOUNTER — Ambulatory Visit: Payer: Medicare Other | Admitting: Nurse Practitioner

## 2016-09-23 ENCOUNTER — Encounter: Payer: Self-pay | Admitting: Nurse Practitioner

## 2016-09-23 ENCOUNTER — Ambulatory Visit (INDEPENDENT_AMBULATORY_CARE_PROVIDER_SITE_OTHER): Payer: Medicare Other | Admitting: Nurse Practitioner

## 2016-09-23 VITALS — BP 103/62 | HR 77 | Ht 66.0 in | Wt 291.4 lb

## 2016-09-23 DIAGNOSIS — F445 Conversion disorder with seizures or convulsions: Secondary | ICD-10-CM

## 2016-09-23 DIAGNOSIS — R569 Unspecified convulsions: Secondary | ICD-10-CM

## 2016-09-23 DIAGNOSIS — F3170 Bipolar disorder, currently in remission, most recent episode unspecified: Secondary | ICD-10-CM | POA: Diagnosis not present

## 2016-09-23 DIAGNOSIS — R0683 Snoring: Secondary | ICD-10-CM

## 2016-09-23 DIAGNOSIS — G40309 Generalized idiopathic epilepsy and epileptic syndromes, not intractable, without status epilepticus: Secondary | ICD-10-CM | POA: Diagnosis not present

## 2016-09-23 MED ORDER — TOPIRAMATE 100 MG PO TABS: ORAL_TABLET | ORAL | 11 refills | Status: DC

## 2016-09-23 MED ORDER — LAMOTRIGINE 150 MG PO TABS: 150.0000 mg | ORAL_TABLET | Freq: Two times a day (BID) | ORAL | 11 refills | Status: DC

## 2016-09-23 NOTE — Progress Notes (Signed)
I have read the note, and I agree with the clinical assessment and plan.  Nancy Hall,Nancy Hall   

## 2016-09-23 NOTE — Patient Instructions (Signed)
Continue Lamictal at current dose, will refill Continue Topamax at current dose will refill Call for seizure activity Get back in to counseling for panic disorder Sleep study evaluation  I explained in particular the risks and ramifications of untreated moderate to severe OSA, especially with respect to cardiovascular disease   difficult to treat hypertension, cardiac arrhythmias, or stroke. Even type 2 diabetes has, in part, been linked to untreated OSA. Symptoms of untreated OSA include daytime sleepiness, memory problems, mood irritability and mood disorder such as depression and anxiety, lack of energy, as well as recurrent headaches, especially morning headaches. We talked about trying to maintain a healthy lifestyle in general, as well as the importance of weight control. I encouraged the patient to eat healthy, exercise daily and keep well hydrated, to keep a scheduled bedtime and wake time routine, to not skip any meals and eat healthy snacks in between meals Follow up in 1 year

## 2016-09-23 NOTE — Progress Notes (Signed)
[facility]  PATIENT: Nancy Hall DOB: April 14, 1970   REASON FOR VISIT: Follow-up for seizure disorder pseudoseizures, hypersomnia with possible sleep apnea and morbid obesity HISTORY FROM: Patient and sister    HISTORY OF PRESENT ILLNESS:Nancy Hall is a 46 year old left-handed white female with a history of morbid obesity and a history of seizures. She has significant psychiatric disease, and she has had well-documented pseudoseizures. The patient has done quite well with her seizures and pseudoseizures since last seen, without any recurrence since February 2015. She has however had 2 panic attacks in the last month and she tells me she has stopped seeing her counselor. The patient does not operate a motor vehicle, and she never has. She lives at home with her mother, and she does not engage in any gainful employment. She has not had any new medical issues since last seen, but she has had some increasing problems with fatigue and excessive daytime drowsiness. The patient snores quite a bit at night, and a sleep study was ordered after her last visit but the patient never followed up for reevaluation . She however denies excessive daytime drowsiness but she does snore. She returns for reevaluation    REVIEW OF SYSTEMS: Full 14 system review of systems performed and notable only for those listed, all others are neg:  Constitutional: neg  Cardiovascular: neg Ear/Nose/Throat:  hearing loss Skin: neg Eyes: neg Respiratory: neg Gastroitestinal: neg  Hematology/Lymphatic: neg  Endocrine: neg MusculoskeletalBack pain Neurological: History of seizure disorder and pseudoseizures Psychiatric:  Depression anxiety bipolar disorder, panic attacks Sleep Snoring  ALLERGIES: Allergies  Allergen Reactions  . Penicillins Rash    HOME MEDICATIONS: Outpatient Medications Prior to Visit  Medication Sig Dispense Refill  . ARIPiprazole (ABILIFY) 15 MG tablet TAKE 1 TABLET EVERY  DAY 90 tablet 1  . benzonatate (TESSALON) 200 MG capsule Take 1 capsule (200 mg total) by mouth 2 (two) times daily as needed for cough. 20 capsule 0  . clotrimazole (LOTRIMIN) 1 % cream Apply 1 application topically 2 (two) times daily. 30 g 0  . docusate sodium (COLACE) 100 MG capsule Take 1 capsule (100 mg total) by mouth 2 (two) times daily as needed for mild constipation. 60 capsule 1  . doxycycline (VIBRA-TABS) 100 MG tablet Take 1 tablet (100 mg total) by mouth 2 (two) times daily. 14 tablet 0  . gabapentin (NEURONTIN) 300 MG capsule Take 1 capsule (300 mg total) by mouth at bedtime. 30 capsule 3  . hydrocortisone cream 0.5 % Apply 1 application topically 2 (two) times daily. 30 g 0  . lamoTRIgine (LAMICTAL) 150 MG tablet TAKE 1 TABLET BY MOUTH 2 TIMES DAILY. 60 tablet 3  . levothyroxine (SYNTHROID, LEVOTHROID) 137 MCG tablet Take 1 tablet (137 mcg total) by mouth daily before breakfast. 60 tablet 5  . meloxicam (MOBIC) 7.5 MG tablet TAKE 1 TABLET BY MOUTH EVERY DAY 30 tablet 0  . omeprazole (PRILOSEC) 40 MG capsule TAKE 1 CAPSULE (40 MG TOTAL) BY MOUTH DAILY. 90 capsule 0  . sertraline (ZOLOFT) 100 MG tablet Take 1 tablet (100 mg total) by mouth daily. 90 tablet 2  . topiramate (TOPAMAX) 100 MG tablet TAKE 1 TABLET IN THE MORNING AND 1 & 1/2 TABLETS AT NIGHT 75 tablet 0  . lamoTRIgine (LAMICTAL) 150 MG tablet TAKE 1 TABLET BY MOUTH 2 TIMES DAILY. 60 tablet 0  . omeprazole (PRILOSEC) 40 MG capsule TAKE 1 CAPSULE (40 MG TOTAL) BY MOUTH DAILY. (Patient not taking: Reported on 09/23/2016)  90 capsule 0   No facility-administered medications prior to visit.     PAST MEDICAL HISTORY: Past Medical History:  Diagnosis Date  . Anemia 2016  . Anxiety   . Bipolar disorder ([place])   . Depression   . Esophagitis    LA Class A  . GERD (gastroesophageal reflux disease)   . Hiatal hernia   . Hypothyroidism   . Learning disability   . Obesity   . Pseudoseizures   . Seizure ([place]) 2014   last  seizure 2(two) years ago  . Seizures ([place])    according to echart- seizures vs pseudoseizures  . Sleep apnea   . Thyroid disease     PAST SURGICAL HISTORY: Past Surgical History:  Procedure Laterality Date  . ABDOMINAL HYSTERECTOMY N/A 01/09/2015   Procedure: HYSTERECTOMY ABDOMINAL/BILATERAL SALPINGECTOMY;  Surgeon: Rubie Maid, MD;  Location: [facility] ORS;  Service: Gynecology;  Laterality: N/A;  . DILATION AND CURETTAGE OF UTERUS    . HYSTEROSCOPY W/D&C N/A 12/19/2014   Procedure: DILATATION AND CURETTAGE /HYSTEROSCOPY;  Surgeon: Rubie Maid, MD;  Location: [facility] ORS;  Service: Gynecology;  Laterality: N/A;  . INNER EAR SURGERY Bilateral 1994   poor historian  . TONSILLECTOMY      FAMILY HISTORY: Family History  Problem Relation Age of Onset  . Colon polyps Mother   . Celiac disease Mother   . Colon polyps Father   . Celiac disease Brother   . Diabetes Sister   . Diabetes Paternal Grandmother   . Heart disease Paternal Grandfather   . Colon polyps Paternal Aunt   . Diabetes Maternal Aunt   . Heart disease Maternal Grandfather   . Heart disease Maternal Uncle   . Heart disease Maternal Aunt   . Seizures Neg Hx     SOCIAL HISTORY: Social History   Social History  . Marital status: Single    Spouse name: N/A  . Number of children: 0  . Years of education: 10   Occupational History  . Not on file.   Social History Main Topics  . Smoking status: Never Smoker  . Smokeless tobacco: Never Used  . Alcohol use No  . Drug use: No  . Sexual activity: No   Other Topics Concern  . Not on file   Social History Narrative   Patient lives at home with mom.    Patient does not have any children.    Patient has a 10th grade education.    Patient is single.    Patient is left handed.    Does not have a living will or HPOA- full code.     PHYSICAL EXAM  Vitals:   09/23/16 1057  BP: 103/62  Pulse: 77  Weight: 291 lb 6.4 oz (132.2 kg)  Height: 5\' 6"  (1.676 m)    Body mass index is 47.03 kg/m.  Generalized: Well developed, Morbidly obese female in no acute distress  Head: normocephalic and atraumatic,. Oropharynx benign  Neck: Supple, no carotid bruits  Cardiac: Regular rate rhythm, no murmur  Musculoskeletal: No deformity   Neurological examination   Mentation: Alert oriented to time, place, history taking. Attention span and concentration appropriate. Recent and remote memory intact.  Follows all commands speech and language fluent.   Cranial nerve II-XII: Pupils were equal round reactive to light extraocular movements were full, visual field were full on confrontational test. Facial sensation and strength were normal. hearing was intact to finger rubbing bilaterally. Uvula tongue midline. head turning and shoulder shrug were  normal and symmetric.Tongue protrusion into cheek strength was normal. Motor: normal bulk and tone, full strength in the BUE, BLE, fine finger movements normal, no pronator drift.  Sensory: normal and symmetric to light touch, pinprick, and  Vibration,  in the upper and lower extremities Coordination: finger-nose-finger, heel-to-shin bilaterally, no dysmetria Reflexes1+ upper lower and symmetric  plantar responses were flexor bilaterally. Gait and Station: Rising up from seated position without assistance, normal stance,  moderate stride, good arm swing, smooth turning, able to perform tiptoe, and heel walking without difficulty. Tandem gait is steady  DIAGNOSTIC DATA (LABS, IMAGING, TESTING) - I reviewed patient records, labs, notes, testing and imaging myself where available.  Lab Results  Component Value Date   WBC 5.3 06/05/2015   HGB 11.7 (L) 06/05/2015   HCT 35.3 (L) 06/05/2015   MCV 93.0 06/05/2015   PLT 152.0 06/05/2015      Component Value Date/Time   NA 137 07/10/2016 1131   K 3.8 07/10/2016 1131   CL 105 07/10/2016 1131   CO2 24 07/10/2016 1131   GLUCOSE 114 (H) 07/10/2016 1131   BUN 12 07/10/2016  1131   CREATININE 0.97 07/10/2016 1131   CALCIUM 9.1 07/10/2016 1131   PROT 7.0 07/10/2016 1131   ALBUMIN 4.3 07/10/2016 1131   AST 11 07/10/2016 1131   ALT 15 07/10/2016 1131   ALKPHOS 90 07/10/2016 1131   BILITOT 0.4 07/10/2016 1131   GFRNONAA >60 01/10/2015 0711   GFRAA >60 01/10/2015 0711   Lab Results  Component Value Date   CHOL 180 06/05/2015   HDL 39.30 06/05/2015   LDLCALC 116 (H) 06/05/2015   LDLDIRECT 86.7 05/06/2012   TRIG 124.0 06/05/2015   CHOLHDL 5 06/05/2015    Lab Results  Component Value Date   TSH 0.81 08/23/2016      ASSESSMENT AND PLAN  46 y.o. year old female  has a past medical history of Depression;  Anxiety; Sleep apnea;  Pseudoseizures; Obesity; Bipolar disorder ([place]); Anemia (2016);  Seizure ([place]) (2014); and Learning disability. here to follow-up. Last seizure was 2015.   PLAN: Continue Lamictal at current dose, will refill Continue Topamax at current dose will refill Call for seizure activity Get back in to counseling for panic disorder Sleep study evaluation  I explained in particular the risks and ramifications of untreated moderate to severe OSA, especially with respect to cardiovascular disease   difficult to treat hypertension, cardiac arrhythmias, or stroke. Even type 2 diabetes has, in part, been linked to untreated OSA. Symptoms of untreated OSA include daytime sleepiness, memory problems, mood irritability and mood disorder such as depression and anxiety, lack of energy, as well as recurrent headaches, especially morning headaches. We talked about trying to maintain a healthy lifestyle in general, as well as the importance of weight control. I encouraged the patient to eat healthy, exercise daily and keep well hydrated, to keep a scheduled bedtime and wake time routine, to not skip any meals and eat healthy snacks in between meals Follow up in 1 year Dennie Bible, [facility], [facility], [facility] [address]  [place], [place] [address] [phone]

## 2016-09-24 IMAGING — DX DG LUMBAR SPINE 2-3V
3 series · 3 of 3 positions shown · non-contrast
Comparison: None in PACs

CLINICAL DATA: Low back pain with radiculopathy for the past 3
weeks, no known trauma

EXAM:
LUMBAR SPINE - 2-3 VIEW

[l-spine ap]
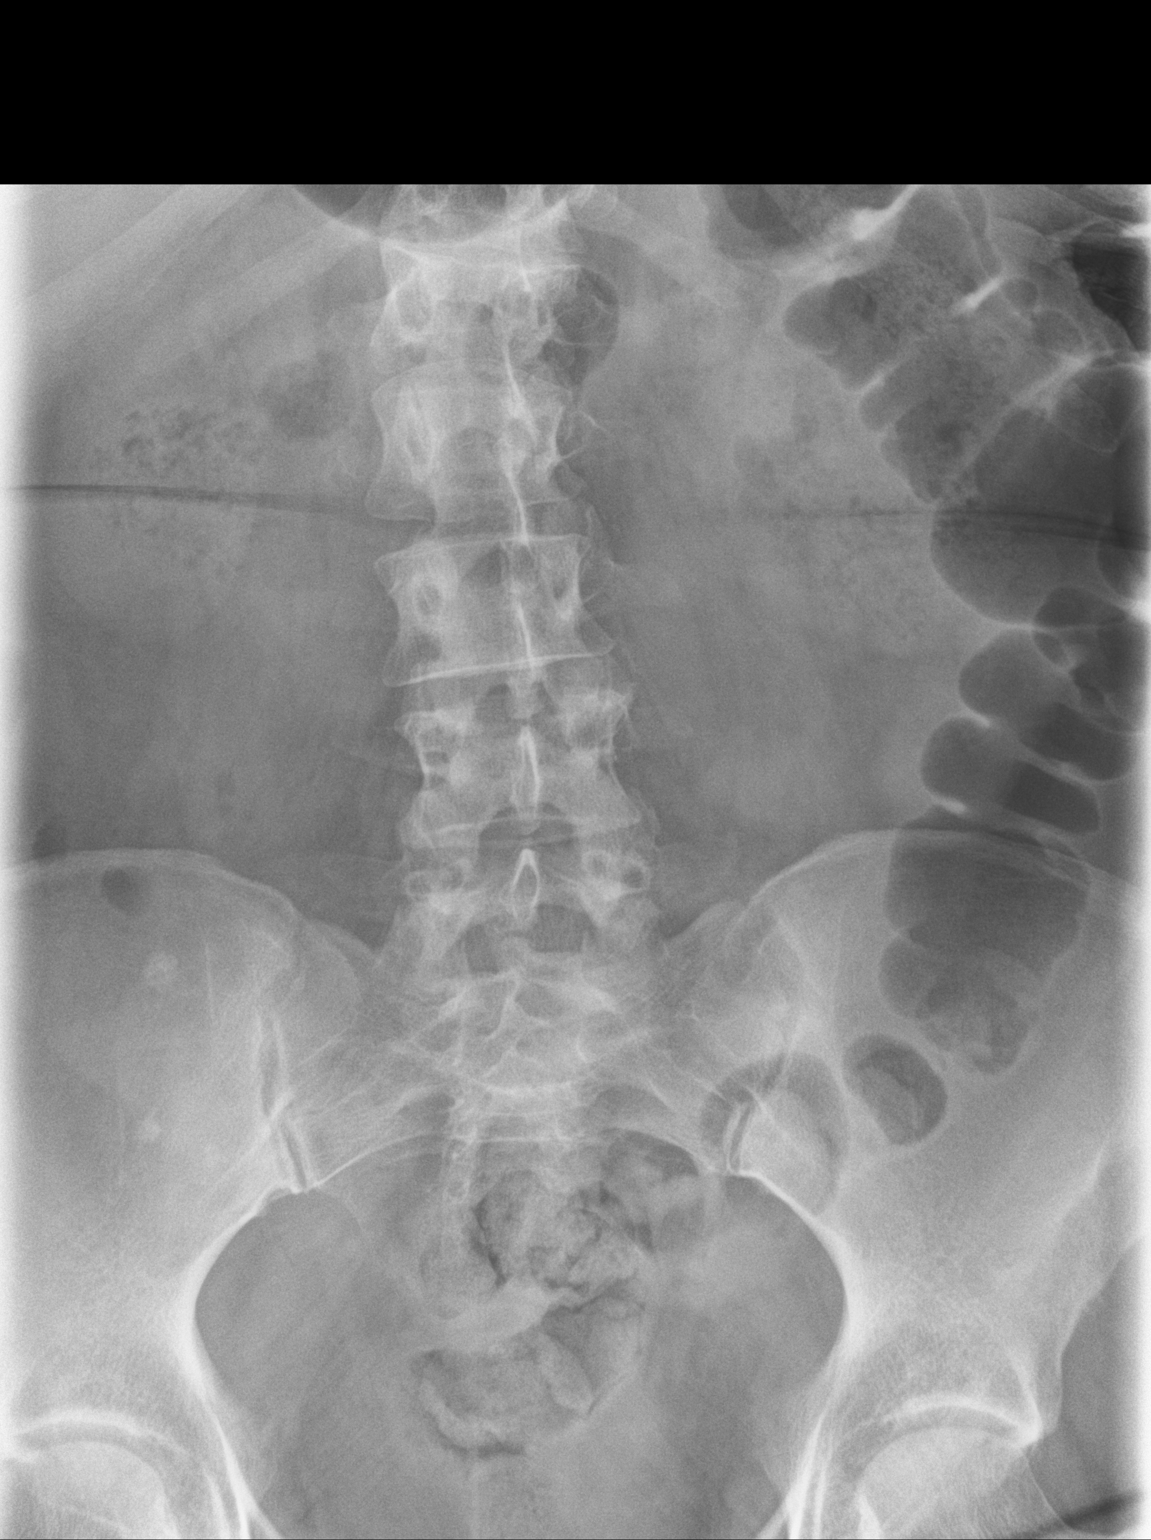

[l-spine lat]
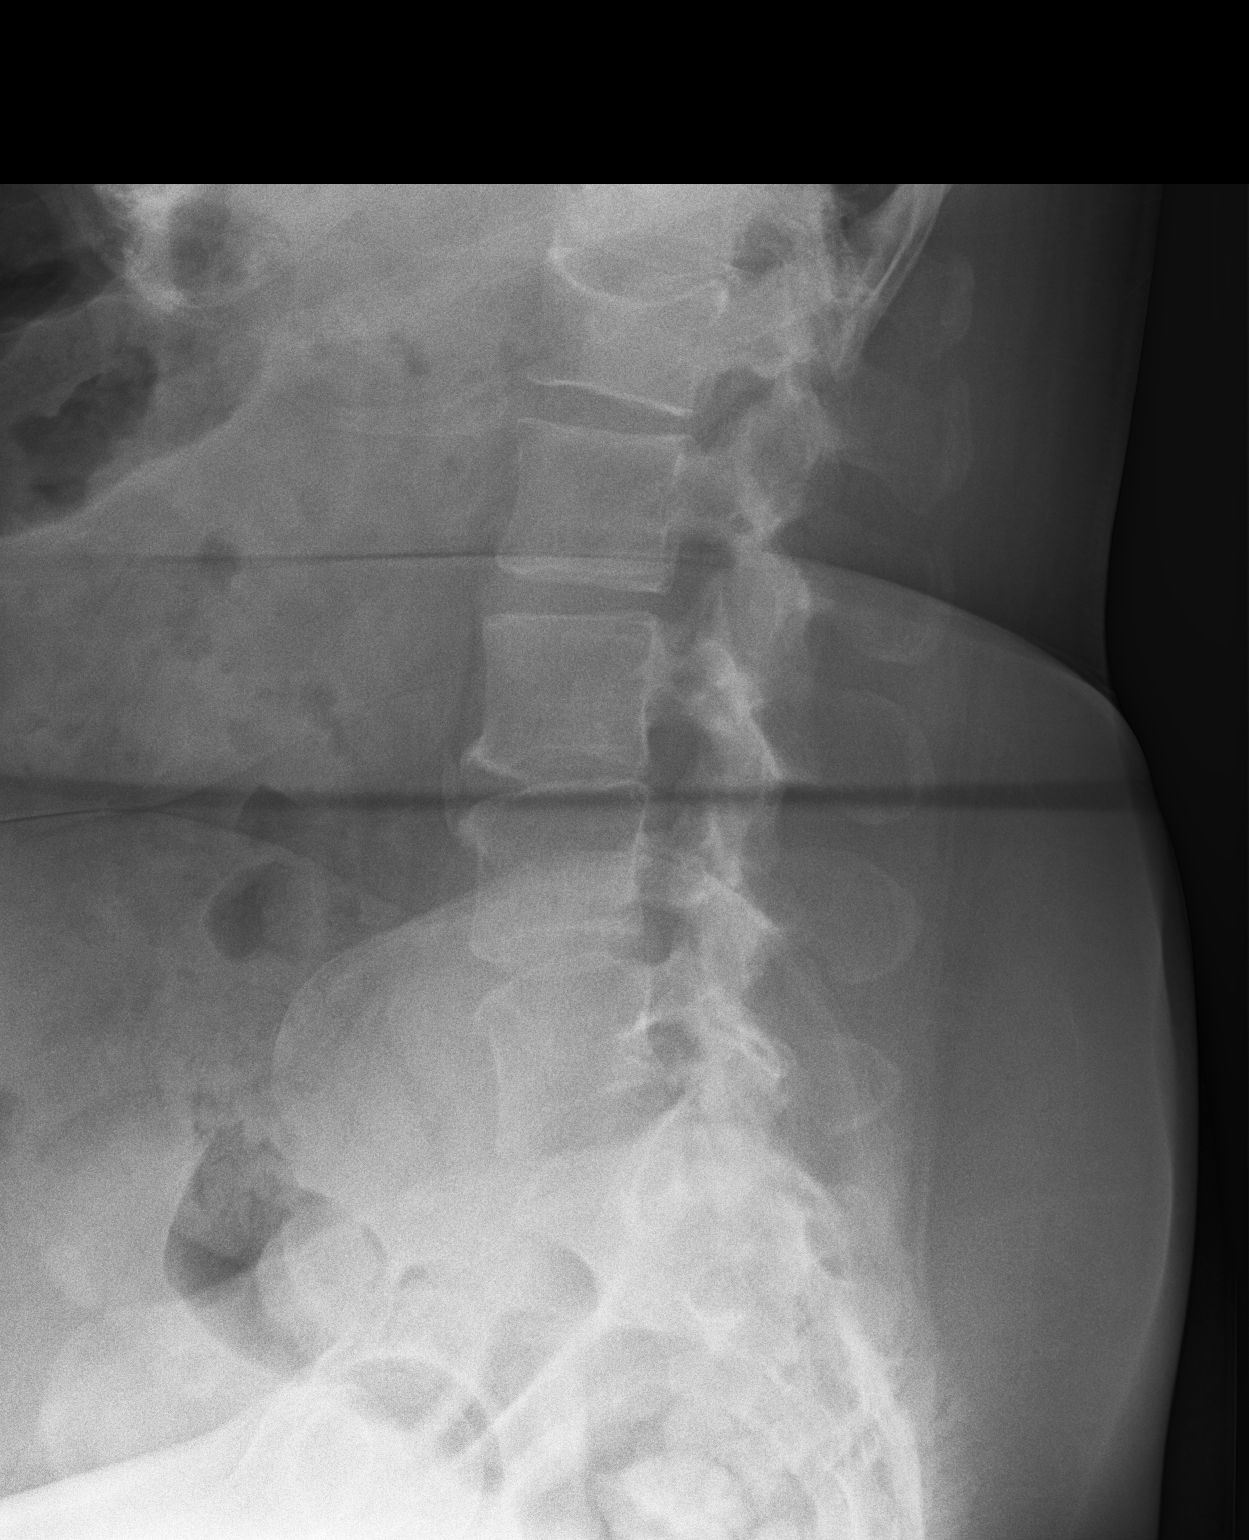

[l-spine l5/s1]
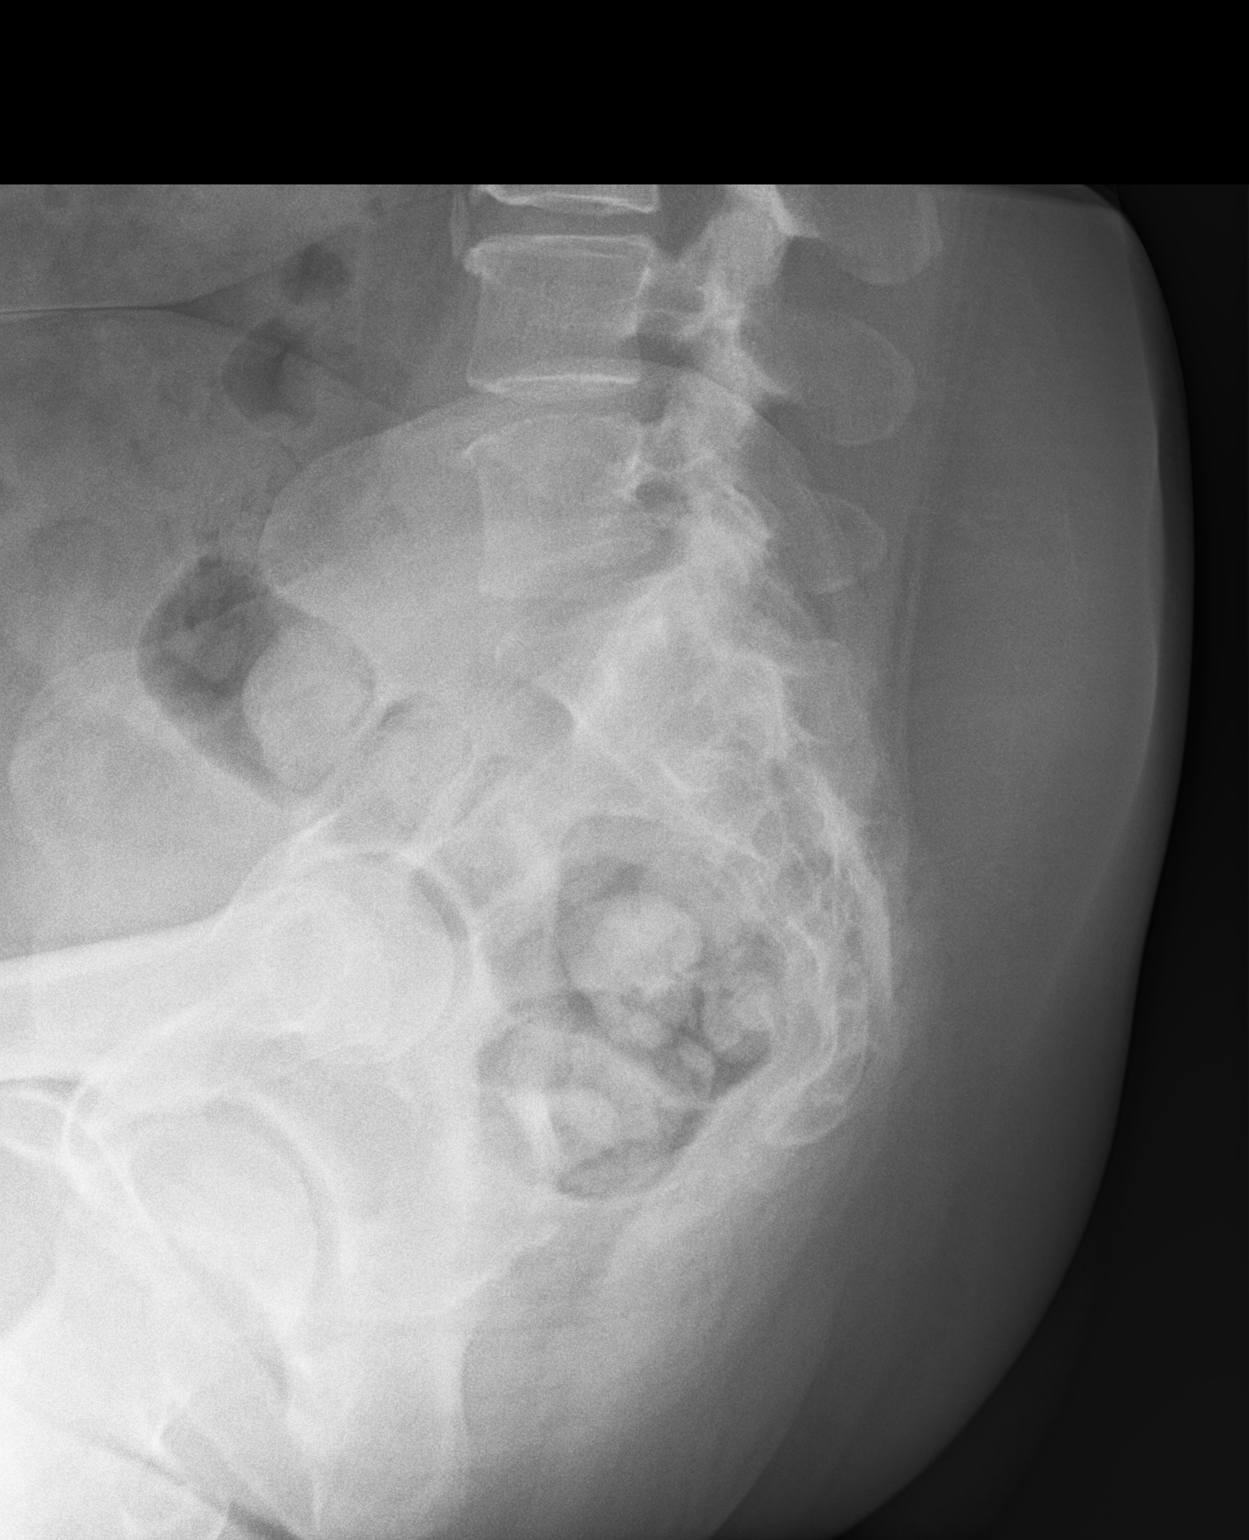

[3 of 3 positions shown; findings below may reference images not displayed]

FINDINGS: There is mild dextro curvature centered at L1 involving the thoracic
and lumbar spines. There is superior endplate depression of L1 of
uncertain age. There is mild disc space narrowing at L3-4 and L4-5.
There is no spondylolisthesis. There is facet joint hypertrophy at
L4-5 and at L5-S1.
IMPRESSION: Superior endplate depression at L1 of uncertain age. Degenerative
disc and facet joint changes as described. If the patient's
radicular symptoms persist, MRI of the lumbar spine would be a
useful next imaging step.

## 2016-10-07 ENCOUNTER — Other Ambulatory Visit: Payer: Self-pay | Admitting: Family Medicine

## 2016-10-25 ENCOUNTER — Other Ambulatory Visit: Payer: Medicare Other

## 2016-10-26 ENCOUNTER — Other Ambulatory Visit: Payer: Self-pay | Admitting: Family Medicine

## 2016-11-05 ENCOUNTER — Other Ambulatory Visit (INDEPENDENT_AMBULATORY_CARE_PROVIDER_SITE_OTHER): Payer: Medicare Other

## 2016-11-05 DIAGNOSIS — E039 Hypothyroidism, unspecified: Secondary | ICD-10-CM

## 2016-11-05 LAB — T4, FREE: Free T4: 1.07 ng/dL (ref 0.60–1.60)

## 2016-11-05 LAB — T3, FREE: T3, Free: 2.8 pg/mL (ref 2.3–4.2)

## 2016-11-05 LAB — TSH: TSH: 2.14 u[IU]/mL (ref 0.35–4.50)

## 2016-11-21 ENCOUNTER — Telehealth: Payer: Self-pay | Admitting: Family Medicine

## 2016-11-21 NOTE — Telephone Encounter (Signed)
Left pt message asking to call Ebony Hail back directly at [phone] to schedule AWV + labs with Katha Cabal and CPE with PCP.  *NOTE* Never had AWV before; Pt can see Katha Cabal; started Medicare in 2010

## 2016-11-28 ENCOUNTER — Other Ambulatory Visit: Payer: Self-pay | Admitting: Family Medicine

## 2016-12-09 ENCOUNTER — Other Ambulatory Visit: Payer: Self-pay | Admitting: Family Medicine

## 2016-12-19 ENCOUNTER — Encounter: Payer: Self-pay | Admitting: Family Medicine

## 2016-12-19 ENCOUNTER — Ambulatory Visit: Payer: Medicare Other

## 2016-12-19 ENCOUNTER — Ambulatory Visit (INDEPENDENT_AMBULATORY_CARE_PROVIDER_SITE_OTHER): Payer: Medicare Other | Admitting: Family Medicine

## 2016-12-19 VITALS — BP 110/78 | HR 69 | Temp 97.9°F | Ht 66.0 in | Wt 291.0 lb

## 2016-12-19 VITALS — BP 110/78 | HR 69 | Temp 97.9°F | Ht 66.0 in | Wt 291.5 lb

## 2016-12-19 DIAGNOSIS — E7800 Pure hypercholesterolemia, unspecified: Secondary | ICD-10-CM

## 2016-12-19 DIAGNOSIS — D649 Anemia, unspecified: Secondary | ICD-10-CM

## 2016-12-19 DIAGNOSIS — Z0000 Encounter for general adult medical examination without abnormal findings: Secondary | ICD-10-CM | POA: Diagnosis not present

## 2016-12-19 DIAGNOSIS — R21 Rash and other nonspecific skin eruption: Secondary | ICD-10-CM | POA: Diagnosis not present

## 2016-12-19 LAB — LIPID PANEL
CHOL/HDL RATIO: 4
Cholesterol: 196 mg/dL (ref 0–200)
HDL: 51.4 mg/dL (ref >39.00)
LDL Cholesterol: 115 mg/dL — ABNORMAL HIGH (ref 0–99)
NONHDL: 144.41
Triglycerides: 148 mg/dL (ref 0.0–149.0)
VLDL: 29.6 mg/dL (ref 0.0–40.0)

## 2016-12-19 LAB — CBC WITH DIFFERENTIAL/PLATELET
BASOS ABS: 0 10*3/uL (ref 0.0–0.1)
Basophils Relative: 0.7 % (ref 0.0–3.0)
EOS PCT: 7.1 % — AB (ref 0.0–5.0)
Eosinophils Absolute: 0.5 10*3/uL (ref 0.0–0.7)
HEMATOCRIT: 33.9 % — AB (ref 36.0–46.0)
HEMOGLOBIN: 10.9 g/dL — AB (ref 12.0–15.0)
LYMPHS PCT: 22.6 % (ref 12.0–46.0)
Lymphs Abs: 1.4 10*3/uL (ref 0.7–4.0)
MCHC: 32.2 g/dL (ref 30.0–36.0)
MCV: 94.1 fl (ref 78.0–100.0)
MONOS PCT: 5.4 % (ref 3.0–12.0)
Monocytes Absolute: 0.3 10*3/uL (ref 0.1–1.0)
Neutro Abs: 4.1 10*3/uL (ref 1.4–7.7)
Neutrophils Relative %: 64.2 % (ref 43.0–77.0)
Platelets: 175 10*3/uL (ref 150.0–400.0)
RBC: 3.61 Mil/uL — AB (ref 3.87–5.11)
RDW: 14.8 % (ref 11.5–15.5)
WBC: 6.4 10*3/uL (ref 4.0–10.5)

## 2016-12-19 NOTE — Progress Notes (Signed)
Subjective:   Patient ID: Nancy Hall, female    DOB: 1970/05/03, 46 y.o.   MRN: [id number]  Nancy Hall is a pleasant 46 y.o. year old female who presents to clinic today with Foot Problem (left side for about a year)  on 12/19/2016  HPI:  Rash on left foot- was getting better with with lotrisone topically. Then I saw her in 07/2016 with what appeared to be a cellulitis of the same area- treated with course of doxycycline and symptoms did improve.  Mom feels now it is "worse then ever."  Itchy, flaky, better when she does not wear socks and shoes.  Not painful.  No fevers, chills, or erythema.  Current Outpatient Prescriptions on File Prior to Visit  Medication Sig Dispense Refill  . ARIPiprazole (ABILIFY) 15 MG tablet TAKE 1 TABLET EVERY DAY 90 tablet 1  . clotrimazole (LOTRIMIN) 1 % cream Apply 1 application topically 2 (two) times daily. 30 g 0  . gabapentin (NEURONTIN) 300 MG capsule TAKE 1 CAPSULE (300 MG TOTAL) BY MOUTH AT BEDTIME. 30 capsule 0  . hydrocortisone cream 0.5 % Apply 1 application topically 2 (two) times daily. 30 g 0  . lamoTRIgine (LAMICTAL) 150 MG tablet Take 1 tablet (150 mg total) by mouth 2 (two) times daily. 60 tablet 11  . levothyroxine (SYNTHROID, LEVOTHROID) 137 MCG tablet Take 1 tablet (137 mcg total) by mouth daily before breakfast. 60 tablet 5  . meloxicam (MOBIC) 7.5 MG tablet TAKE 1 TABLET BY MOUTH EVERY DAY 30 tablet 3  . omeprazole (PRILOSEC) 40 MG capsule TAKE 1 CAPSULE (40 MG TOTAL) BY MOUTH DAILY. 90 capsule 0  . sertraline (ZOLOFT) 100 MG tablet TAKE 1 TABLET (100 MG TOTAL) BY MOUTH DAILY. 90 tablet 0  . topiramate (TOPAMAX) 100 MG tablet TAKE 1 TABLET IN THE MORNING AND 1 & 1/2 TABLETS AT NIGHT 75 tablet 11   No current facility-administered medications on file prior to visit.     Allergies  Allergen Reactions  . Penicillins Rash    Past Medical History:  Diagnosis Date  . Anemia 2016  . Anxiety   . Bipolar disorder ([place])   .  Depression   . Esophagitis    LA Class A  . GERD (gastroesophageal reflux disease)   . Hiatal hernia   . Hypothyroidism   . Learning disability   . Obesity   . Pseudoseizures   . Seizure ([place]) 2014   last seizure 2(two) years ago  . Seizures ([place])    according to echart- seizures vs pseudoseizures  . Sleep apnea   . Thyroid disease     Past Surgical History:  Procedure Laterality Date  . ABDOMINAL HYSTERECTOMY N/A 01/09/2015   Procedure: HYSTERECTOMY ABDOMINAL/BILATERAL SALPINGECTOMY;  Surgeon: Rubie Maid, MD;  Location: [facility] ORS;  Service: Gynecology;  Laterality: N/A;  . DILATION AND CURETTAGE OF UTERUS    . HYSTEROSCOPY W/D&C N/A 12/19/2014   Procedure: DILATATION AND CURETTAGE /HYSTEROSCOPY;  Surgeon: Rubie Maid, MD;  Location: [facility] ORS;  Service: Gynecology;  Laterality: N/A;  . INNER EAR SURGERY Bilateral 1994   poor historian  . TONSILLECTOMY      Family History  Problem Relation Age of Onset  . Colon polyps Mother   . Celiac disease Mother   . Colon polyps Father   . Celiac disease Brother   . Diabetes Sister   . Diabetes Paternal Grandmother   . Heart disease Paternal Grandfather   . Colon polyps Paternal Aunt   .  Diabetes Maternal Aunt   . Heart disease Maternal Grandfather   . Heart disease Maternal Uncle   . Heart disease Maternal Aunt   . Seizures Neg Hx     Social History   Social History  . Marital status: Single    Spouse name: N/A  . Number of children: 0  . Years of education: 10   Occupational History  . Not on file.   Social History Main Topics  . Smoking status: Never Smoker  . Smokeless tobacco: Never Used  . Alcohol use No  . Drug use: No  . Sexual activity: No   Other Topics Concern  . Not on file   Social History Narrative   Patient lives at home with mom.    Patient does not have any children.    Patient has a 10th grade education.    Patient is single.    Patient is left handed.    Does not have a living will or  HPOA- full code.   The PMH, PSH, Social History, Family History, Medications, and allergies have been reviewed in [facility], and have been updated if relevant.   Review of Systems  Constitutional: Negative.   Skin: Positive for rash.  All other systems reviewed and are negative.      Objective:    BP 110/78   Pulse 69   Temp 97.9 F (36.6 C) (Oral)   Ht 5\' 6"  (1.676 m)   Wt 291 lb (132 kg)   LMP 12/19/2014 (Exact Date)   SpO2 98%   BMI 46.97 kg/m    Physical Exam  Constitutional: She is oriented to person, place, and time. She appears well-developed and well-nourished. No distress.  HENT:  Head: Normocephalic and atraumatic.  Eyes: Conjunctivae are normal.  Cardiovascular: Normal rate.   Pulmonary/Chest: Effort normal.  Musculoskeletal: Normal range of motion.  Neurological: She is alert and oriented to person, place, and time. No cranial nerve deficit.  Skin: She is not diaphoretic.     Psychiatric: She has a normal mood and affect. Her behavior is normal. Judgment and thought content normal.  Nursing note and vitals reviewed.         Assessment & Plan:   Rash and nonspecific skin eruption - Plan: Ambulatory referral to Dermatology No Follow-up on file.

## 2016-12-19 NOTE — Patient Instructions (Signed)
[name] , Thank you for taking time to come for your Medicare Wellness Visit. I appreciate your ongoing commitment to your health goals. Please review the following plan we discussed and let me know if I can assist you in the future.   These are the goals we discussed: Goals    . Increase physical activity          Starting 12/24/2016, I will attempt to walk at least 15 min daily as weather permits.        This is a list of the screening recommended for you and due dates:  Health Maintenance  Topic Date Due  . Flu Shot  06/29/2017*  . HIV Screening  12/19/2017*  . Tetanus Vaccine  12/20/2026*  *Topic was postponed. The date shown is not the original due date.   Preventive Care for Adults  A healthy lifestyle and preventive care can promote health and wellness. Preventive health guidelines for adults include the following key practices.  . A routine yearly physical is a good way to check with your health care provider about your health and preventive screening. It is a chance to share any concerns and updates on your health and to receive a thorough exam.  . Visit your dentist for a routine exam and preventive care every 6 months. Brush your teeth twice a day and floss once a day. Good oral hygiene prevents tooth decay and gum disease.  . The frequency of eye exams is based on your age, health, family medical history, use  of contact lenses, and other factors. Follow your health care provider's ecommendations for frequency of eye exams.  . Eat a healthy diet. Foods like vegetables, fruits, whole grains, low-fat dairy products, and lean protein foods contain the nutrients you need without too many calories. Decrease your intake of foods high in solid fats, added sugars, and salt. Eat the right amount of calories for you. Get information about a proper diet from your health care provider, if necessary.  . Regular physical exercise is one of the most important things you can do for your  health. Most adults should get at least 150 minutes of moderate-intensity exercise (any activity that increases your heart rate and causes you to sweat) each week. In addition, most adults need muscle-strengthening exercises on 2 or more days a week.  Silver Sneakers may be a benefit available to you. To determine eligibility, you may visit the website: www.silversneakers.com or contact program at (775) 219-1463 Mon-Fri between 8AM-8PM.   . Maintain a healthy weight. The body mass index (BMI) is a screening tool to identify possible weight problems. It provides an estimate of body fat based on height and weight. Your health care provider can find your BMI and can help you achieve or maintain a healthy weight.   For adults 20 years and older: ? A BMI below 18.5 is considered underweight. ? A BMI of 18.5 to 24.9 is normal. ? A BMI of 25 to 29.9 is considered overweight. ? A BMI of 30 and above is considered obese.   . Maintain normal blood lipids and cholesterol levels by exercising and minimizing your intake of saturated fat. Eat a balanced diet with plenty of fruit and vegetables. Blood tests for lipids and cholesterol should begin at age 39 and be repeated every 5 years. If your lipid or cholesterol levels are high, you are over 50, or you are at high risk for heart disease, you may need your cholesterol levels checked more frequently.  Ongoing high lipid and cholesterol levels should be treated with medicines if diet and exercise are not working.  . If you smoke, find out from your health care provider how to quit. If you do not use tobacco, please do not start.  . If you choose to drink alcohol, please do not consume more than 2 drinks per day. One drink is considered to be 12 ounces (355 mL) of beer, 5 ounces (148 mL) of wine, or 1.5 ounces (44 mL) of liquor.  . If you are 57-56 years old, ask your health care provider if you should take aspirin to prevent strokes.  . Use sunscreen. Apply  sunscreen liberally and repeatedly throughout the day. You should seek shade when your shadow is shorter than you. Protect yourself by wearing long sleeves, pants, a wide-brimmed hat, and sunglasses year round, whenever you are outdoors.  . Once a month, do a whole body skin exam, using a mirror to look at the skin on your back. Tell your health care provider of new moles, moles that have irregular borders, moles that are larger than a pencil eraser, or moles that have changed in shape or color.

## 2016-12-19 NOTE — Progress Notes (Signed)
Pre visit review using our clinic review tool, if applicable. No additional management support is needed unless otherwise documented below in the visit note. 

## 2016-12-19 NOTE — Progress Notes (Signed)
Subjective:   Nancy Hall is a 46 y.o. female who presents for Medicare Annual (Subsequent) preventive examination.  Review of Systems:  N/A Cardiac Risk Factors include: obesity (BMI >30kg/m2);dyslipidemia     Objective:     Vitals: BP 110/78   Pulse 69   Temp 97.9 F (36.6 C) (Oral)   Ht 5\' 6"  (1.676 m) Comment: shoes  Wt 291 lb 8 oz (132.2 kg)   LMP 12/19/2014 (Exact Date)   SpO2 98%   BMI 47.05 kg/m   Body mass index is 47.05 kg/m.   Tobacco History  Smoking Status  . Never Smoker  Smokeless Tobacco  . Never Used     Counseling given: No   Past Medical History:  Diagnosis Date  . Anemia 2016  . Anxiety   . Bipolar disorder ([place])   . Depression   . Esophagitis    LA Class A  . GERD (gastroesophageal reflux disease)   . Hiatal hernia   . Hypothyroidism   . Learning disability   . Obesity   . Pseudoseizures   . Seizure ([place]) 2014   last seizure 2(two) years ago  . Seizures ([place])    according to echart- seizures vs pseudoseizures  . Sleep apnea   . Thyroid disease    Past Surgical History:  Procedure Laterality Date  . ABDOMINAL HYSTERECTOMY N/A 01/09/2015   Procedure: HYSTERECTOMY ABDOMINAL/BILATERAL SALPINGECTOMY;  Surgeon: Rubie Maid, MD;  Location: [facility] ORS;  Service: Gynecology;  Laterality: N/A;  . DILATION AND CURETTAGE OF UTERUS    . HYSTEROSCOPY W/D&C N/A 12/19/2014   Procedure: DILATATION AND CURETTAGE /HYSTEROSCOPY;  Surgeon: Rubie Maid, MD;  Location: [facility] ORS;  Service: Gynecology;  Laterality: N/A;  . INNER EAR SURGERY Bilateral 1994   poor historian  . TONSILLECTOMY     Family History  Problem Relation Age of Onset  . Colon polyps Mother   . Celiac disease Mother   . Colon polyps Father   . Celiac disease Brother   . Diabetes Sister   . Diabetes Paternal Grandmother   . Heart disease Paternal Grandfather   . Colon polyps Paternal Aunt   . Diabetes Maternal Aunt   . Heart disease Maternal Grandfather   . Heart  disease Maternal Uncle   . Heart disease Maternal Aunt   . Seizures Neg Hx    History  Sexual Activity  . Sexual activity: No    Outpatient Encounter Prescriptions as of 12/19/2016  Medication Sig  . ARIPiprazole (ABILIFY) 15 MG tablet TAKE 1 TABLET EVERY DAY  . clotrimazole (LOTRIMIN) 1 % cream Apply 1 application topically 2 (two) times daily.  Marland Kitchen gabapentin (NEURONTIN) 300 MG capsule TAKE 1 CAPSULE (300 MG TOTAL) BY MOUTH AT BEDTIME.  . hydrocortisone cream 0.5 % Apply 1 application topically 2 (two) times daily.  Marland Kitchen lamoTRIgine (LAMICTAL) 150 MG tablet Take 1 tablet (150 mg total) by mouth 2 (two) times daily.  Marland Kitchen levothyroxine (SYNTHROID, LEVOTHROID) 137 MCG tablet Take 1 tablet (137 mcg total) by mouth daily before breakfast.  . meloxicam (MOBIC) 7.5 MG tablet TAKE 1 TABLET BY MOUTH EVERY DAY  . omeprazole (PRILOSEC) 40 MG capsule TAKE 1 CAPSULE (40 MG TOTAL) BY MOUTH DAILY.  Marland Kitchen sertraline (ZOLOFT) 100 MG tablet TAKE 1 TABLET (100 MG TOTAL) BY MOUTH DAILY.  Marland Kitchen topiramate (TOPAMAX) 100 MG tablet TAKE 1 TABLET IN THE MORNING AND 1 & 1/2 TABLETS AT NIGHT   No facility-administered encounter medications on file as of 12/19/2016.  Activities of Daily Living In your present state of health, do you have any difficulty performing the following activities: 12/19/2016  Hearing? Y  Vision? N  Difficulty concentrating or making decisions? N  Walking or climbing stairs? Y  Dressing or bathing? N  Doing errands, shopping? Y  Preparing Food and eating ? N  Using the Toilet? N  In the past six months, have you accidently leaked urine? N  Do you have problems with loss of bowel control? N  Managing your Medications? N  Managing your Finances? Y  Housekeeping or managing your Housekeeping? N  Some recent data might be hidden    Patient Care Team: Lucille Passy, MD as PCP - General (Family Medicine) Philemon Kingdom, MD as Consulting Physician (Internal Medicine) Rubie Maid, MD as  Referring Physician (Obstetrics and Gynecology) Hassell Done Dicky Doe, NP as Nurse Practitioner (Family Medicine) Ladene Artist, MD as Consulting Physician (Gastroenterology)    Assessment:     Hearing Screening   125Hz  250Hz  500Hz  1000Hz  2000Hz  3000Hz  4000Hz  6000Hz  8000Hz   Right ear:   40 40 40  0    Left ear:   40 40 40  0      Visual Acuity Screening   Right eye Left eye Both eyes  Without correction:     With correction: 20/40 20/50 20/30     Exercise Activities and Dietary recommendations Current Exercise Habits: The patient does not participate in regular exercise at present, Exercise limited by: None identified  Goals    . Increase physical activity          Starting 12/24/2016, I will attempt to walk at least 15 min daily as weather permits.       Fall Risk Fall Risk  12/19/2016 06/27/2015 09/21/2013  Falls in the past year? No No No   Depression Screen PHQ 2/9 Scores 12/19/2016 06/27/2015 09/21/2013  PHQ - 2 Score 2 2 6   PHQ- 9 Score 8 - 14     Cognitive Function MMSE - Mini Mental State Exam 12/19/2016  Orientation to time 5  Orientation to Place 5  Registration 3  Attention/ Calculation 0  Recall 3  Language- name 2 objects 0  Language- repeat 1  Language- follow 3 step command 2  Language- follow 3 step command-comments unable to follow 1 step of 3 step command  Language- read & follow direction 0  Write a sentence 0  Copy design 0  Total score 19       PLEASE NOTE: A Mini-Cog screen was completed. Maximum score is 20. A value of 0 denotes this part of [place] MMSE was not completed or the patient failed this part of the Mini-Cog screening.   Mini-Cog Screening Orientation to Time - Max 5 pts Orientation to Place - Max 5 pts Registration - Max 3 pts Recall - Max 3 pts Language Repeat - Max 1 pts Language Follow 3 Step Command - Max 3 pts   Immunization History  Administered Date(s) Administered  . Influenza, Seasonal, Injecte, Preservative Fre  04/08/2012  . Influenza,inj,Quad PF,6+ Mos 12/31/2012, 04/24/2015, 01/01/2016   Screening Tests Health Maintenance  Topic Date Due  . INFLUENZA VACCINE  06/29/2017 (Originally 10/30/2016)  . HIV Screening  12/19/2017 (Originally 10/08/1987)  . TETANUS/TDAP  12/20/2026 (Originally 10/08/1991)      Plan:     I have personally reviewed and addressed the Medicare Annual Wellness questionnaire and have noted the following in the patient's chart:  A. Medical and social history B.  Use of alcohol, tobacco or illicit drugs  C. Current medications and supplements D. Functional ability and status E.  Nutritional status F.  Physical activity G. Advance directives H. List of other physicians I.  Hospitalizations, surgeries, and ER visits in previous 12 months J.  [place] to include hearing, vision, cognitive, depression L. Referrals and appointments - none  In addition, I have reviewed and discussed with patient certain preventive protocols, quality metrics, and best practice recommendations. A written personalized care plan for preventive services as well as general preventive health recommendations were provided to patient.  See attached scanned questionnaire for additional information.   Signed,   Lindell Noe, MHA, BS, LPN Health Coach

## 2016-12-19 NOTE — Progress Notes (Signed)
I reviewed health advisor's note, was available for consultation, and agree with documentation and plan.  

## 2016-12-19 NOTE — Progress Notes (Signed)
PCP notes:   Health maintenance:  Flu vaccine - pt requested to have vaccine at CPE HIV screening - declined Tetanus vaccine - postponed/insurance  Abnormal screenings:   Mini-Cog score: 19/20 Depression score: 8 Hearing - failed  Hearing Screening   125Hz  250Hz  500Hz  1000Hz  2000Hz  3000Hz  4000Hz  6000Hz  8000Hz   Right ear:   40 40 40  0    Left ear:   40 40 40  0      Patient concerns:   None  Nurse concerns:  None  Next PCP appt:   12/26/16 @ 1000

## 2016-12-19 NOTE — Assessment & Plan Note (Signed)
Deteriorated- ?if truly fungal or if something else is occurring. Will refer to dermatology for evaluation and tx. The patient indicates understanding of these issues and agrees with the plan.

## 2016-12-26 ENCOUNTER — Encounter: Payer: Self-pay | Admitting: Family Medicine

## 2016-12-26 ENCOUNTER — Ambulatory Visit (INDEPENDENT_AMBULATORY_CARE_PROVIDER_SITE_OTHER): Payer: Medicare Other | Admitting: Family Medicine

## 2016-12-26 VITALS — BP 108/78 | HR 74 | Ht 66.0 in | Wt 291.0 lb

## 2016-12-26 DIAGNOSIS — E039 Hypothyroidism, unspecified: Secondary | ICD-10-CM

## 2016-12-26 DIAGNOSIS — F3170 Bipolar disorder, currently in remission, most recent episode unspecified: Secondary | ICD-10-CM

## 2016-12-26 DIAGNOSIS — R569 Unspecified convulsions: Secondary | ICD-10-CM | POA: Diagnosis not present

## 2016-12-26 DIAGNOSIS — Z23 Encounter for immunization: Secondary | ICD-10-CM | POA: Diagnosis not present

## 2016-12-26 DIAGNOSIS — G40309 Generalized idiopathic epilepsy and epileptic syndromes, not intractable, without status epilepticus: Secondary | ICD-10-CM | POA: Diagnosis not present

## 2016-12-26 DIAGNOSIS — E785 Hyperlipidemia, unspecified: Secondary | ICD-10-CM

## 2016-12-26 NOTE — Assessment & Plan Note (Signed)
Symptoms seem stable on current rxs. No changes made today.

## 2016-12-26 NOTE — Assessment & Plan Note (Signed)
Clinically euthyroid now followed by endo.

## 2016-12-26 NOTE — Telephone Encounter (Signed)
Completed 12/19/16

## 2016-12-26 NOTE — Progress Notes (Signed)
Subjective:   Patient ID: Nancy Hall, female    DOB: 1970/05/11, 46 y.o.   MRN: [id number]  Nancy Hall is a pleasant 46 y.o. year old female who presents to clinic today with Follow-up  on 12/26/2016  HPI:  Medicare wellness visit with Candis Musa, RN on 12/19/16.  Note reviewed.  Hypothyroidism- sees Dr. Cruzita Lederer.  Last saw her on 08/23/16. Note reviewed.  Lab Results  Component Value Date   TSH 2.14 11/05/2016   Seizure d/o - first grandmal seziure at 46 yo.  Has "emotional seizures/pseudoseizures," had a few more around her father's death in 01/31/2013.  Followed by neuro- last saw Evlyn Courier on 09/23/16- note reviewed.  No changes made to rxs.  Bipolar disorder- On Abilify, zoloft. Has appt with a new therapist on 01/20/2017,  Lab Results  Component Value Date   CHOL 196 12/19/2016   HDL 51.40 12/19/2016   LDLCALC 115 (H) 12/19/2016   LDLDIRECT 86.7 05/06/2012   TRIG 148.0 12/19/2016   CHOLHDL 4 12/19/2016   Lab Results  Component Value Date   ALT 15 07/10/2016   AST 11 07/10/2016   ALKPHOS 90 07/10/2016   BILITOT 0.4 07/10/2016   Lab Results  Component Value Date   NA 137 07/10/2016   K 3.8 07/10/2016   CL 105 07/10/2016   CO2 24 07/10/2016   Lab Results  Component Value Date   WBC 6.4 12/19/2016   HGB 10.9 (L) 12/19/2016   HCT 33.9 (L) 12/19/2016   MCV 94.1 12/19/2016   PLT 175.0 12/19/2016   Current Outpatient Prescriptions on File Prior to Visit  Medication Sig Dispense Refill  . ARIPiprazole (ABILIFY) 15 MG tablet TAKE 1 TABLET EVERY DAY 90 tablet 1  . clotrimazole (LOTRIMIN) 1 % cream Apply 1 application topically 2 (two) times daily. 30 g 0  . gabapentin (NEURONTIN) 300 MG capsule TAKE 1 CAPSULE (300 MG TOTAL) BY MOUTH AT BEDTIME. 30 capsule 0  . hydrocortisone cream 0.5 % Apply 1 application topically 2 (two) times daily. 30 g 0  . lamoTRIgine (LAMICTAL) 150 MG tablet Take 1 tablet (150 mg total) by mouth 2 (two) times daily. 60  tablet 11  . levothyroxine (SYNTHROID, LEVOTHROID) 137 MCG tablet Take 1 tablet (137 mcg total) by mouth daily before breakfast. 60 tablet 5  . meloxicam (MOBIC) 7.5 MG tablet TAKE 1 TABLET BY MOUTH EVERY DAY 30 tablet 3  . omeprazole (PRILOSEC) 40 MG capsule TAKE 1 CAPSULE (40 MG TOTAL) BY MOUTH DAILY. 90 capsule 0  . sertraline (ZOLOFT) 100 MG tablet TAKE 1 TABLET (100 MG TOTAL) BY MOUTH DAILY. 90 tablet 0  . topiramate (TOPAMAX) 100 MG tablet TAKE 1 TABLET IN THE MORNING AND 1 & 1/2 TABLETS AT NIGHT 75 tablet 11   No current facility-administered medications on file prior to visit.     Allergies  Allergen Reactions  . Penicillins Rash    Past Medical History:  Diagnosis Date  . Anemia 2016  . Anxiety   . Bipolar disorder ([place])   . Depression   . Esophagitis    LA Class A  . GERD (gastroesophageal reflux disease)   . Hiatal hernia   . Hypothyroidism   . Learning disability   . Obesity   . Pseudoseizures   . Seizure ([place]) 2014   last seizure 2(two) years ago  . Seizures ([place])    according to echart- seizures vs pseudoseizures  . Sleep apnea   . Thyroid disease  Past Surgical History:  Procedure Laterality Date  . ABDOMINAL HYSTERECTOMY N/A 01/09/2015   Procedure: HYSTERECTOMY ABDOMINAL/BILATERAL SALPINGECTOMY;  Surgeon: Rubie Maid, MD;  Location: [facility] ORS;  Service: Gynecology;  Laterality: N/A;  . DILATION AND CURETTAGE OF UTERUS    . HYSTEROSCOPY W/D&C N/A 12/19/2014   Procedure: DILATATION AND CURETTAGE /HYSTEROSCOPY;  Surgeon: Rubie Maid, MD;  Location: [facility] ORS;  Service: Gynecology;  Laterality: N/A;  . INNER EAR SURGERY Bilateral 1994   poor historian  . TONSILLECTOMY      Family History  Problem Relation Age of Onset  . Colon polyps Mother   . Celiac disease Mother   . Colon polyps Father   . Celiac disease Brother   . Diabetes Sister   . Diabetes Paternal Grandmother   . Heart disease Paternal Grandfather   . Colon polyps Paternal Aunt   .  Diabetes Maternal Aunt   . Heart disease Maternal Grandfather   . Heart disease Maternal Uncle   . Heart disease Maternal Aunt   . Seizures Neg Hx     Social History   Social History  . Marital status: Single    Spouse name: N/A  . Number of children: 0  . Years of education: 10   Occupational History  . Not on file.   Social History Main Topics  . Smoking status: Never Smoker  . Smokeless tobacco: Never Used  . Alcohol use No  . Drug use: No  . Sexual activity: No   Other Topics Concern  . Not on file   Social History Narrative   Patient lives at home with mom.    Patient does not have any children.    Patient has a 10th grade education.    Patient is single.    Patient is left handed.    Does not have a living will or HPOA- full code.   The PMH, PSH, Social History, Family History, Medications, and allergies have been reviewed in [facility], and have been updated if relevant.  Review of Systems  Constitutional: Negative.   HENT: Negative.   Eyes: Negative.   Respiratory: Negative.   Cardiovascular: Negative.   Gastrointestinal: Negative.   Endocrine: Negative.   Genitourinary: Negative.   Musculoskeletal: Negative.   Allergic/Immunologic: Negative.   Neurological: Negative.   Hematological: Negative.   Psychiatric/Behavioral: Negative.   All other systems reviewed and are negative.      Objective:    BP 108/78   Pulse 74   Ht 5\' 6"  (1.676 m)   Wt 291 lb (132 kg)   LMP 12/19/2014 (Exact Date)   SpO2 99%   BMI 46.97 kg/m    Physical Exam   General:  Well-developed,well-nourished,in no acute distress; alert,appropriate and cooperative throughout examination Head:  normocephalic and atraumatic.   Eyes:  vision grossly intact, PERRL Ears:  R ear normal and L ear normal externally, TMs clear bilaterally Nose:  no external deformity.   Mouth:  good dentition.   Neck:  No deformities, masses, or tenderness noted. Lungs:  Normal respiratory effort,  chest expands symmetrically. Lungs are clear to auscultation, no crackles or wheezes. Heart:  Normal rate and regular rhythm. S1 and S2 normal without gallop, murmur, click, rub or other extra sounds. Abdomen:  Bowel sounds positive,abdomen soft and non-tender without masses, organomegaly or hernias noted. Msk:  No deformity or scoliosis noted of thoracic or lumbar spine.   Extremities:  No clubbing, cyanosis, edema, or deformity noted with normal full range of motion of  all joints.   Neurologic:  alert & oriented X3 and gait normal.   Skin:  Intact without suspicious lesions or rashes Cervical Nodes:  No lymphadenopathy noted Axillary Nodes:  No palpable lymphadenopathy Psych:  Cognition and judgment appear intact. Alert and cooperative with normal attention span and concentration. No apparent delusions, illusions, hallucinations       Assessment & Plan:   Bipolar disorder in partial remission, most recent episode unspecified type ([place])  Dyslipidemia  Generalized convulsive epilepsy ([place])  Hypothyroidism, unspecified type  Seizures ([place])  Need for immunization against influenza - Plan: Flu Vaccine QUAD 36+ mos IM No Follow-up on file.

## 2016-12-26 NOTE — Assessment & Plan Note (Signed)
On lamictal and topamax, followed by neuro.

## 2016-12-26 NOTE — Patient Instructions (Signed)
Great to see you. Please call to schedule your mammogram. 

## 2016-12-26 NOTE — Assessment & Plan Note (Signed)
Improved with diet  

## 2016-12-31 ENCOUNTER — Ambulatory Visit (INDEPENDENT_AMBULATORY_CARE_PROVIDER_SITE_OTHER): Payer: Medicare Other | Admitting: Licensed Clinical Social Worker

## 2016-12-31 DIAGNOSIS — F3173 Bipolar disorder, in partial remission, most recent episode manic: Secondary | ICD-10-CM

## 2017-01-01 DIAGNOSIS — B351 Tinea unguium: Secondary | ICD-10-CM | POA: Diagnosis not present

## 2017-01-01 DIAGNOSIS — B353 Tinea pedis: Secondary | ICD-10-CM | POA: Diagnosis not present

## 2017-01-08 ENCOUNTER — Other Ambulatory Visit: Payer: Self-pay | Admitting: Family Medicine

## 2017-01-20 ENCOUNTER — Ambulatory Visit: Payer: Medicare Other | Admitting: Licensed Clinical Social Worker

## 2017-01-20 ENCOUNTER — Ambulatory Visit (INDEPENDENT_AMBULATORY_CARE_PROVIDER_SITE_OTHER): Payer: Medicare Other | Admitting: Licensed Clinical Social Worker

## 2017-01-20 DIAGNOSIS — F3173 Bipolar disorder, in partial remission, most recent episode manic: Secondary | ICD-10-CM | POA: Diagnosis not present

## 2017-02-03 ENCOUNTER — Other Ambulatory Visit: Payer: Self-pay | Admitting: Family Medicine

## 2017-02-06 ENCOUNTER — Ambulatory Visit (INDEPENDENT_AMBULATORY_CARE_PROVIDER_SITE_OTHER): Payer: Medicare Other | Admitting: Licensed Clinical Social Worker

## 2017-02-06 DIAGNOSIS — F3173 Bipolar disorder, in partial remission, most recent episode manic: Secondary | ICD-10-CM

## 2017-02-19 DIAGNOSIS — H60399 Other infective otitis externa, unspecified ear: Secondary | ICD-10-CM | POA: Diagnosis not present

## 2017-02-19 DIAGNOSIS — H903 Sensorineural hearing loss, bilateral: Secondary | ICD-10-CM | POA: Diagnosis not present

## 2017-02-19 DIAGNOSIS — H7311 Chronic myringitis, right ear: Secondary | ICD-10-CM | POA: Diagnosis not present

## 2017-02-19 DIAGNOSIS — H6983 Other specified disorders of Eustachian tube, bilateral: Secondary | ICD-10-CM | POA: Diagnosis not present

## 2017-02-24 ENCOUNTER — Ambulatory Visit (INDEPENDENT_AMBULATORY_CARE_PROVIDER_SITE_OTHER): Payer: Medicare Other | Admitting: Internal Medicine

## 2017-02-24 VITALS — BP 124/72 | HR 70 | Wt 289.0 lb

## 2017-02-24 DIAGNOSIS — E039 Hypothyroidism, unspecified: Secondary | ICD-10-CM

## 2017-02-24 DIAGNOSIS — R232 Flushing: Secondary | ICD-10-CM | POA: Insufficient documentation

## 2017-02-24 LAB — TSH: TSH: 4.99 u[IU]/mL — AB (ref 0.35–4.50)

## 2017-02-24 LAB — T4, FREE: FREE T4: 1.1 ng/dL (ref 0.60–1.60)

## 2017-02-24 NOTE — Progress Notes (Signed)
Patient ID: Nancy Hall, female   DOB: May 21, 1970, 46 y.o.   MRN: [id number]   HPI  Nancy Hall is a 46 y.o.-year-old female, returning for f/u for uncontrolled hypothyroidism. Last visit 6 mo ago. He is here with her mother, who offers part of the history as patient is mostly nonverbal.   Reviewed and addended history: Pt. has been dx with hypothyroidism in 2054 (46 years old); she is on Levothyroxine 75 >> then started LT5 100 mcg + Cytomel 25  Daily. We have been trying to reduce her Cytomel >>  LT4 125 mcg + LT3 5 mcg daily >> LT4 only (137 mcg daily).  She takes the Levothyroxine: - in am - fasting - at least 30 min from b'fast - no Ca, Fe, MVI - + PPIs at night - not on Biotin  I reviewed pt's thyroid tests: Lab Results  Component Value Date   TSH 2.14 11/05/2016   TSH 0.81 08/23/2016   TSH 3.98 02/01/2016   TSH 2.89 12/21/2015   TSH 2.43 06/05/2015   TSH 7.12 (H) 04/24/2015   TSH 11.180 (H) 11/03/2014   TSH 1.11 10/27/2013   TSH 13.11 (H) 09/14/2013   TSH 0.06 (L) 05/06/2012   FREET4 1.07 11/05/2016   FREET4 0.92 08/23/2016   FREET4 0.74 02/01/2016   FREET4 0.69 12/21/2015   FREET4 0.76 06/05/2015   FREET4 0.50 (L) 04/24/2015   FREET4 0.51 (L) 10/27/2013   FREET4 0.46 (L) 09/14/2013   FREET4 1.00 05/06/2012   FREET4 0.97 05/30/2011    Pt denies: - feeling nodules in neck - hoarseness - dysphagia - choking - SOB with lying down  She has + FH of thyroid disorders in: MGM. No FH of thyroid cancer. No h/o radiation tx to head or neck.  No seaweed or kelp. No recent contrast studies. No herbal supplements. No Biotin use. No recent steroids use.   She also has a history of Bipolar disease (started to see a counselor), depression, GERD, h/o fibroids, h/o hysterectomy, h/o ear infections, h/o seizures.  She started Neurontin (300 mg at night) for back pain since last visit >> more fatigue.  Her therapist would like to change her Abilify to also help with wt  loss.   ROS: Constitutional: + weight gain/no weight loss, + fatigue, + subjective hyperthermia (hot flashes), no subjective hypothermia Eyes: no blurry vision, no xerophthalmia ENT: no sore throat, + see HPI Cardiovascular: no CP/no SOB/no palpitations/no leg swelling Respiratory: no cough/no SOB/no wheezing Gastrointestinal: no N/no V/no D/no C/no acid reflux Musculoskeletal: no muscle aches/no joint aches Skin: no rashes, no hair loss Neurological: no tremors/no numbness/no tingling/no dizziness  I reviewed pt's medications, allergies, PMH, social hx, family hx, and changes were documented in the history of present illness. Otherwise, unchanged from my initial visit note.   Past Medical History:  Diagnosis Date  . Anemia 2016  . Anxiety   . Bipolar disorder ([place])   . Depression   . Esophagitis    LA Class A  . GERD (gastroesophageal reflux disease)   . Hiatal hernia   . Hypothyroidism   . Learning disability   . Obesity   . Pseudoseizures   . Seizure ([place]) 2014   last seizure 2(two) years ago  . Seizures ([place])    according to echart- seizures vs pseudoseizures  . Sleep apnea   . Thyroid disease    Past Surgical History:  Procedure Laterality Date  . ABDOMINAL HYSTERECTOMY N/A 01/09/2015   Procedure:  HYSTERECTOMY ABDOMINAL/BILATERAL SALPINGECTOMY;  Surgeon: Rubie Maid, MD;  Location: [facility] ORS;  Service: Gynecology;  Laterality: N/A;  . DILATION AND CURETTAGE OF UTERUS    . HYSTEROSCOPY W/D&C N/A 12/19/2014   Procedure: DILATATION AND CURETTAGE /HYSTEROSCOPY;  Surgeon: Rubie Maid, MD;  Location: [facility] ORS;  Service: Gynecology;  Laterality: N/A;  . INNER EAR SURGERY Bilateral 1994   poor historian  . TONSILLECTOMY     Social History   Social History  . Marital Status: Single    Spouse Name: N/A  . Number of Children: 0  . Years of Education: 10   Social History Main Topics  . Smoking status: Never Smoker   . Smokeless tobacco: Never Used  . Alcohol Use:  No  . Drug Use: No   Social History Narrative   Patient lives at home with mom.    Patient does not have any children.    Patient has a 10th grade education.    Patient is single.    Patient is left handed.    Does not have a living will or HPOA- full code.   Current Outpatient Medications on File Prior to Visit  Medication Sig Dispense Refill  . ARIPiprazole (ABILIFY) 15 MG tablet TAKE 1 TABLET EVERY DAY 90 tablet 1  . clotrimazole (LOTRIMIN) 1 % cream Apply 1 application topically 2 (two) times daily. 30 g 0  . gabapentin (NEURONTIN) 300 MG capsule TAKE 1 CAPSULE BY MOUTH EVERYDAY AT BEDTIME 30 capsule 11  . hydrocortisone cream 0.5 % Apply 1 application topically 2 (two) times daily. 30 g 0  . lamoTRIgine (LAMICTAL) 150 MG tablet Take 1 tablet (150 mg total) by mouth 2 (two) times daily. 60 tablet 11  . levothyroxine (SYNTHROID, LEVOTHROID) 137 MCG tablet Take 1 tablet (137 mcg total) by mouth daily before breakfast. 60 tablet 5  . meloxicam (MOBIC) 7.5 MG tablet TAKE 1 TABLET BY MOUTH EVERY DAY 30 tablet 5  . omeprazole (PRILOSEC) 40 MG capsule TAKE 1 CAPSULE (40 MG TOTAL) BY MOUTH DAILY. 90 capsule 0  . sertraline (ZOLOFT) 100 MG tablet TAKE 1 TABLET (100 MG TOTAL) BY MOUTH DAILY. 90 tablet 0  . topiramate (TOPAMAX) 100 MG tablet TAKE 1 TABLET IN THE MORNING AND 1 & 1/2 TABLETS AT NIGHT 75 tablet 11   No current facility-administered medications on file prior to visit.    Allergies  Allergen Reactions  . Penicillins Rash   Family History  Problem Relation Age of Onset  . Colon polyps Mother   . Celiac disease Mother   . Colon polyps Father   . Celiac disease Brother   . Diabetes Sister   . Diabetes Paternal Grandmother   . Heart disease Paternal Grandfather   . Colon polyps Paternal Aunt   . Diabetes Maternal Aunt   . Heart disease Maternal Grandfather   . Heart disease Maternal Uncle   . Heart disease Maternal Aunt   . Seizures Neg Hx    PE: BP 124/72 (BP  Location: Left Arm, Patient Position: Sitting)   Pulse 70   Wt 289 lb (131.1 kg)   LMP 12/19/2014 (Exact Date)   SpO2 97%   BMI 46.65 kg/m  Wt Readings from Last 3 Encounters:  02/24/17 289 lb (131.1 kg)  12/26/16 291 lb (132 kg)  12/19/16 291 lb 8 oz (132.2 kg)   Constitutional: overweight, in NAD Eyes: PERRLA, EOMI, no exophthalmos ENT: moist mucous membranes, no thyromegaly, no cervical lymphadenopathy Cardiovascular: RRR, No MRG Respiratory: CTA  B Gastrointestinal: abdomen soft, NT, ND, BS+ Musculoskeletal: no deformities, strength intact in all 4 Skin: moist, warm, no rashes Neurological: no tremor with outstretched hands, DTR normal in all 4  ASSESSMENT: 1. Hypothyroidism  2. Obesity  3.  Hot flashes  PLAN:  1. Patient with long-standing hypothyroidism, previously on levothyroxine (LT4) + liothyronine (LT3) therapy.  We have been decreasing her LT3 dose and was able to stop this summer.  Her head tremor improved after stopping Cytomel.  Her TFTs returned normal on 10/2016. - she continues on LT4 137 mcg daily - pt feels good on this dose, but she feels fatigued and had weight gain - we discussed with her and her mother that this may be due to Neurontin.  They will discuss with PCP to try to reduce the dose.  Also, herpsychiatrist is thinking about changing  her psychotropic medication  - we discussed about taking the thyroid hormone every day, with water, >30 minutes before breakfast, separated by >4 hours from acid reflux medications, calcium, iron, multivitamins. Pt. is taking it correctly - will check thyroid tests today: TSH and fT4 - If labs are abnormal, she will need to return for repeat TFTs in 1.5 months - OTW, RTC in 1 year   2. Obesity - BMI 46 - recommended [facility] weight loss center at last visit but did not look into this  3.  Hot flashes - She has a history of TAH + BSO for fibroids - She complains of hot flashes at night - We discussed that she   is a candidate for hormone replacement treatment and I recommended that she sees her OB/GYN to discuss estrogen replacement. I advised her that she needs that for the health of her bones and she will need to be on this until the average age of menopause, which is 46 years old and [place]  Office Visit on 02/24/2017  Component Date Value Ref Range Status  . TSH 02/24/2017 4.99* 0.35 - 4.50 uIU/mL Final  . Free T4 02/24/2017 1.10  0.60 - 1.60 ng/dL Final   Comment: Specimens from patients who are undergoing biotin therapy and /or ingesting biotin supplements may contain high levels of biotin.  The higher biotin concentration in these specimens interferes with this Free T4 assay.  Specimens that contain high levels  of biotin may cause false high results for this Free T4 assay.  Please interpret results in light of the total clinical presentation of the patient.     Message sent: Dear Anderson Malta, Your TSH is only slightly high, but I would like to repeat the test before increasing your dose of levothyroxine further.  Let us plan to repeat this after the holidays, at the beginning of January. Please call our main office number [phone]) to schedule a lab appointment.  Sincerely, Philemon Kingdom MD  Philemon Kingdom, MD PhD [facility]

## 2017-02-24 NOTE — Patient Instructions (Addendum)
Please stop at the lab.  Continue Levothyroxine 137 mcg daily.  Take the thyroid hormone every day, with water, at least 30 minutes before breakfast, separated by at least 4 hours from: - acid reflux medications - calcium - iron - multivitamins  Please come back for a follow-up appointment in 1 year.  Look into: [facility] Address: [address], [place], [place] [address]  Phone: [phone]

## 2017-02-27 ENCOUNTER — Other Ambulatory Visit: Payer: Self-pay | Admitting: Family Medicine

## 2017-03-04 ENCOUNTER — Encounter: Payer: Self-pay | Admitting: Family Medicine

## 2017-03-04 ENCOUNTER — Ambulatory Visit (INDEPENDENT_AMBULATORY_CARE_PROVIDER_SITE_OTHER): Payer: Medicare Other | Admitting: Family Medicine

## 2017-03-04 VITALS — BP 132/88 | HR 82 | Temp 97.8°F | Ht 66.0 in | Wt 289.0 lb

## 2017-03-04 DIAGNOSIS — K5900 Constipation, unspecified: Secondary | ICD-10-CM | POA: Diagnosis not present

## 2017-03-04 DIAGNOSIS — R232 Flushing: Secondary | ICD-10-CM | POA: Diagnosis not present

## 2017-03-04 DIAGNOSIS — R300 Dysuria: Secondary | ICD-10-CM | POA: Diagnosis not present

## 2017-03-04 NOTE — Assessment & Plan Note (Signed)
New- start miralax 17 g daily. Call or return to clinic prn if these symptoms worsen or fail to improve as anticipated. The patient indicates understanding of these issues and agrees with the plan.

## 2017-03-04 NOTE — Assessment & Plan Note (Signed)
Discussed HRT - risk of blood clots/stroke are significant for her given strong family h/o of thromboembolisms (dad and brother). She and her mom would like to hold off on HRT at this time. She feels hot flashes are manageable with a fan.

## 2017-03-04 NOTE — Progress Notes (Signed)
Subjective:   Patient ID: Nancy Hall, female    DOB: Apr 15, 1970, 46 y.o.   MRN: [id number]  Nancy Hall is a pleasant 46 y.o. year old female who presents to clinic today with Dysuria (Patient is here today C/O dysuria and urinary frequency x1wk.  States that she has some lower abd pain and left flank pain.); Rectal Problems (She is also C/O a pain when she has a bowel movement.  She states that sometimes her stools are hard.  Yesterday when she had a bowel movement there was a little bit of blood which was red.); and Night Sweats (States that she saw her Endocrinologist who advised that she discuss possible HRT for her night sweats.  She also has them intermittently throughout the day.  She had a total hysterestomy 2 years ago.)  on 03/04/2017  HPI:  Dysuria- 1 week of dysuria and urinary frequency with some intermittent suprapubic pressure and left flank pain. No hematuria. No fevers or chills. No n/v.  Rectal issues- she says stools have been hard and now she is having pain with BMs.  There was a little BRB per rectum after her BM yesterday.  Night sweats- saw her endocrinologist recently who suggested she consider HRT for this. Remote h/o TAH + BSO for fibroids (2 years ago).  Current Outpatient Medications on File Prior to Visit  Medication Sig Dispense Refill  . ARIPiprazole (ABILIFY) 15 MG tablet TAKE 1 TABLET BY MOUTH EVERY DAY 90 tablet 1  . clotrimazole (LOTRIMIN) 1 % cream Apply 1 application topically 2 (two) times daily. 30 g 0  . gabapentin (NEURONTIN) 300 MG capsule TAKE 1 CAPSULE BY MOUTH EVERYDAY AT BEDTIME 30 capsule 11  . hydrocortisone cream 0.5 % Apply 1 application topically 2 (two) times daily. 30 g 0  . lamoTRIgine (LAMICTAL) 150 MG tablet Take 1 tablet (150 mg total) by mouth 2 (two) times daily. 60 tablet 11  . levothyroxine (SYNTHROID, LEVOTHROID) 137 MCG tablet Take 1 tablet (137 mcg total) by mouth daily before breakfast. 60 tablet 5  . meloxicam  (MOBIC) 7.5 MG tablet TAKE 1 TABLET BY MOUTH EVERY DAY 30 tablet 5  . omeprazole (PRILOSEC) 40 MG capsule TAKE 1 CAPSULE (40 MG TOTAL) BY MOUTH DAILY. 90 capsule 0  . sertraline (ZOLOFT) 100 MG tablet TAKE 1 TABLET (100 MG TOTAL) BY MOUTH DAILY. 90 tablet 0  . topiramate (TOPAMAX) 100 MG tablet TAKE 1 TABLET IN THE MORNING AND 1 & 1/2 TABLETS AT NIGHT 75 tablet 11   No current facility-administered medications on file prior to visit.     Allergies  Allergen Reactions  . Penicillins Rash    Past Medical History:  Diagnosis Date  . Anemia 2016  . Anxiety   . Bipolar disorder ([place])   . Depression   . Esophagitis    LA Class A  . GERD (gastroesophageal reflux disease)   . Hiatal hernia   . Hypothyroidism   . Learning disability   . Obesity   . Pseudoseizures   . Seizure ([place]) 2014   last seizure 2(two) years ago  . Seizures ([place])    according to echart- seizures vs pseudoseizures  . Sleep apnea   . Thyroid disease     Past Surgical History:  Procedure Laterality Date  . ABDOMINAL HYSTERECTOMY N/A 01/09/2015   Procedure: HYSTERECTOMY ABDOMINAL/BILATERAL SALPINGECTOMY;  Surgeon: Rubie Maid, MD;  Location: [facility] ORS;  Service: Gynecology;  Laterality: N/A;  . DILATION AND CURETTAGE OF UTERUS    .  HYSTEROSCOPY W/D&C N/A 12/19/2014   Procedure: DILATATION AND CURETTAGE /HYSTEROSCOPY;  Surgeon: Rubie Maid, MD;  Location: [facility] ORS;  Service: Gynecology;  Laterality: N/A;  . INNER EAR SURGERY Bilateral 1994   poor historian  . TONSILLECTOMY      Family History  Problem Relation Age of Onset  . Colon polyps Mother   . Celiac disease Mother   . Colon polyps Father   . Celiac disease Brother   . Diabetes Sister   . Diabetes Paternal Grandmother   . Heart disease Paternal Grandfather   . Colon polyps Paternal Aunt   . Diabetes Maternal Aunt   . Heart disease Maternal Grandfather   . Heart disease Maternal Uncle   . Heart disease Maternal Aunt   . Seizures Neg Hx      Social History   Socioeconomic History  . Marital status: Single    Spouse name: Not on file  . Number of children: 0  . Years of education: 10  . Highest education level: Not on file  Social Needs  . Financial resource strain: Not on file  . Food insecurity - worry: Not on file  . Food insecurity - inability: Not on file  . Transportation needs - medical: Not on file  . Transportation needs - non-medical: Not on file  Occupational History  . Not on file  Tobacco Use  . Smoking status: Never Smoker  . Smokeless tobacco: Never Used  Substance and Sexual Activity  . Alcohol use: No    Alcohol/week: 0.0 oz  . Drug use: No  . Sexual activity: No  Other Topics Concern  . Not on file  Social History Narrative   Patient lives at home with mom.    Patient does not have any children.    Patient has a 10th grade education.    Patient is single.    Patient is left handed.    Does not have a living will or HPOA- full code.   The PMH, PSH, Social History, Family History, Medications, and allergies have been reviewed in [facility], and have been updated if relevant.   Review of Systems  Constitutional: Positive for fatigue. Negative for fever.  HENT: Negative.   Eyes: Negative.   Respiratory: Negative.   Cardiovascular: Negative.   Gastrointestinal: Positive for constipation.  Endocrine: Negative.   Genitourinary: Positive for dysuria and flank pain. Negative for enuresis, frequency, genital sores, hematuria, menstrual problem, pelvic pain and urgency.  Allergic/Immunologic: Negative.   Neurological: Negative.   Hematological: Negative.   Psychiatric/Behavioral: Negative.   All other systems reviewed and are negative.      Objective:    BP 132/88 (BP Location: Left Arm, Patient Position: Sitting, Cuff Size: Large)   Pulse 82   Temp 97.8 F (36.6 C) (Oral)   Ht 5\' 6"  (1.676 m)   Wt 289 lb (131.1 kg)   LMP 12/19/2014 (Exact Date)   SpO2 98%   BMI 46.65 kg/m     Physical Exam  Constitutional: She is oriented to person, place, and time. She appears well-developed and well-nourished. No distress.  HENT:  Head: Normocephalic and atraumatic.  Eyes: Conjunctivae are normal.  Cardiovascular: Normal rate.  Pulmonary/Chest: Effort normal.  Abdominal: Soft. There is no tenderness.  Musculoskeletal: Normal range of motion.  No CVA tenderness  Neurological: She is alert and oriented to person, place, and time. No cranial nerve deficit.  Skin: Skin is warm and dry. She is not diaphoretic.  Psychiatric: She has a normal  mood and affect. Her behavior is normal. Judgment and thought content normal.  Nursing note and vitals reviewed.         Assessment & Plan:   Hot flashes  Dysuria  Constipation, unspecified constipation type No Follow-up on file.

## 2017-03-04 NOTE — Assessment & Plan Note (Signed)
New- unfortunately we do not have ability yet to take a POC UA at this office. UA with reflex culture drawn and will send off.

## 2017-03-04 NOTE — Patient Instructions (Signed)
Great to see you. We are starting Miralax 17 gram daily until your symptoms resolve.  Please keep me updated.  I will call you with your urine results.  Dr. Leafy Ro: Weight loss management doctor in [place]. Call [phone] for a free informational session.

## 2017-03-06 ENCOUNTER — Ambulatory Visit: Payer: Medicare Other | Admitting: Licensed Clinical Social Worker

## 2017-03-06 LAB — URINALYSIS W MICROSCOPIC + REFLEX CULTURE
BILIRUBIN URINE: NEGATIVE
Bacteria, UA: NONE SEEN /HPF
GLUCOSE, UA: NEGATIVE
HGB URINE DIPSTICK: NEGATIVE
Hyaline Cast: NONE SEEN /LPF
KETONES UR: NEGATIVE
NITRITES URINE, INITIAL: NEGATIVE
Protein, ur: NEGATIVE
Specific Gravity, Urine: 1.009 (ref 1.001–1.03)
pH: 7.5 (ref 5.0–8.0)

## 2017-03-06 LAB — URINE CULTURE
MICRO NUMBER:: 81367466
SPECIMEN QUALITY: ADEQUATE

## 2017-03-06 LAB — CULTURE INDICATED

## 2017-03-20 ENCOUNTER — Ambulatory Visit (INDEPENDENT_AMBULATORY_CARE_PROVIDER_SITE_OTHER): Payer: Medicare Other | Admitting: Licensed Clinical Social Worker

## 2017-03-20 DIAGNOSIS — F3173 Bipolar disorder, in partial remission, most recent episode manic: Secondary | ICD-10-CM

## 2017-03-26 DIAGNOSIS — H60331 Swimmer's ear, right ear: Secondary | ICD-10-CM | POA: Diagnosis not present

## 2017-03-26 DIAGNOSIS — H6983 Other specified disorders of Eustachian tube, bilateral: Secondary | ICD-10-CM | POA: Diagnosis not present

## 2017-03-26 DIAGNOSIS — H60399 Other infective otitis externa, unspecified ear: Secondary | ICD-10-CM | POA: Diagnosis not present

## 2017-03-26 DIAGNOSIS — H7311 Chronic myringitis, right ear: Secondary | ICD-10-CM | POA: Diagnosis not present

## 2017-03-26 DIAGNOSIS — H903 Sensorineural hearing loss, bilateral: Secondary | ICD-10-CM | POA: Diagnosis not present

## 2017-03-27 ENCOUNTER — Other Ambulatory Visit: Payer: Self-pay

## 2017-03-27 MED ORDER — SERTRALINE HCL 100 MG PO TABS: 100.0000 mg | ORAL_TABLET | Freq: Every day | ORAL | 3 refills | Status: DC

## 2017-04-03 ENCOUNTER — Other Ambulatory Visit (INDEPENDENT_AMBULATORY_CARE_PROVIDER_SITE_OTHER): Payer: Medicare Other

## 2017-04-03 DIAGNOSIS — E039 Hypothyroidism, unspecified: Secondary | ICD-10-CM

## 2017-04-03 LAB — T4, FREE: FREE T4: 0.56 ng/dL — AB (ref 0.60–1.60)

## 2017-04-03 LAB — TSH: TSH: 18.41 u[IU]/mL — AB (ref 0.35–4.50)

## 2017-04-09 ENCOUNTER — Telehealth: Payer: Self-pay | Admitting: Internal Medicine

## 2017-04-09 DIAGNOSIS — E039 Hypothyroidism, unspecified: Secondary | ICD-10-CM

## 2017-04-09 NOTE — Telephone Encounter (Signed)
LMTCB

## 2017-04-09 NOTE — Telephone Encounter (Signed)
Please advise on below, a mychart message was sent to her in regards to her labs as well

## 2017-04-09 NOTE — Telephone Encounter (Signed)
Pt's mother is aware  

## 2017-04-09 NOTE — Telephone Encounter (Signed)
Yes, of course, that is most likely the reason for her high TSH.  Please continue on the current dose of levothyroxine, take it absolutely every day and then return in 1.5 months for labs.

## 2017-04-09 NOTE — Telephone Encounter (Signed)
Pts mom calling about blood work she had done, her results where alittle high.  Pts mom said she ran out of medication and not sure if she was on her meds at the time of the blood work Wants to know if that will effect the pts results. And if that is the reason her results were higher .  Please advise

## 2017-04-21 ENCOUNTER — Ambulatory Visit: Payer: Medicare Other | Admitting: Licensed Clinical Social Worker

## 2017-05-07 NOTE — Telephone Encounter (Signed)
Pts mother called and asked if pt needed to come back in sometime in march for blood work. I did not see any orders in so I did not want to set her up an appt till I knew the dr needed them.     Also stated that pt is Shaking real bad in Mouth and hands and is not sure what is causing that.   Please advise

## 2017-05-07 NOTE — Telephone Encounter (Signed)
Please advise on below  

## 2017-05-07 NOTE — Telephone Encounter (Signed)
LMTCB  Labs ordered

## 2017-05-07 NOTE — Telephone Encounter (Signed)
Let's order a TSH and fT4. I am not sure about the mouth tremors >> please check with PCP.

## 2017-05-09 ENCOUNTER — Telehealth: Payer: Self-pay | Admitting: Internal Medicine

## 2017-05-09 ENCOUNTER — Telehealth: Payer: Self-pay

## 2017-05-09 NOTE — Telephone Encounter (Signed)
LMTCB

## 2017-05-09 NOTE — Telephone Encounter (Signed)
Mother returned call, when called back had to LVM

## 2017-05-09 NOTE — Telephone Encounter (Signed)
Patients mother calling back- she stated she missed a call but did not know what the call was about

## 2017-05-09 NOTE — Telephone Encounter (Signed)
Patient's daughter returning call. Evelyn's ph# [phone]

## 2017-05-09 NOTE — Telephone Encounter (Signed)
Pt's mother is aware  

## 2017-05-09 NOTE — Telephone Encounter (Signed)
See other message

## 2017-05-14 ENCOUNTER — Ambulatory Visit (INDEPENDENT_AMBULATORY_CARE_PROVIDER_SITE_OTHER): Payer: Medicare Other | Admitting: Licensed Clinical Social Worker

## 2017-05-14 DIAGNOSIS — F3173 Bipolar disorder, in partial remission, most recent episode manic: Secondary | ICD-10-CM | POA: Diagnosis not present

## 2017-06-02 ENCOUNTER — Other Ambulatory Visit (INDEPENDENT_AMBULATORY_CARE_PROVIDER_SITE_OTHER): Payer: Medicare Other

## 2017-06-02 DIAGNOSIS — E039 Hypothyroidism, unspecified: Secondary | ICD-10-CM

## 2017-06-02 LAB — T4, FREE: FREE T4: 0.81 ng/dL (ref 0.60–1.60)

## 2017-06-02 LAB — TSH: TSH: 2.49 u[IU]/mL (ref 0.35–4.50)

## 2017-06-10 ENCOUNTER — Ambulatory Visit (INDEPENDENT_AMBULATORY_CARE_PROVIDER_SITE_OTHER): Payer: Medicare Other

## 2017-06-10 ENCOUNTER — Encounter: Payer: Self-pay | Admitting: Family Medicine

## 2017-06-10 ENCOUNTER — Ambulatory Visit (INDEPENDENT_AMBULATORY_CARE_PROVIDER_SITE_OTHER): Payer: Medicare Other | Admitting: Family Medicine

## 2017-06-10 VITALS — BP 126/76 | HR 86 | Temp 98.4°F | Ht 66.0 in | Wt 295.8 lb

## 2017-06-10 DIAGNOSIS — M5430 Sciatica, unspecified side: Secondary | ICD-10-CM

## 2017-06-10 DIAGNOSIS — M545 Low back pain: Secondary | ICD-10-CM | POA: Diagnosis not present

## 2017-06-10 DIAGNOSIS — F3170 Bipolar disorder, currently in remission, most recent episode unspecified: Secondary | ICD-10-CM

## 2017-06-10 NOTE — Patient Instructions (Signed)
Good to see you.  I will call you with your xray results and we will call you with your psychiatry appointment.  Try taking two Neurontin capsules tonight (600 mg total).

## 2017-06-10 NOTE — Assessment & Plan Note (Signed)
Deteriorated. Will get a lumbar xray today since she does have a positive SLR. Advised trying to take 600 mg of neurontin for a few nights to see if that helps. Call or return to clinic prn if these symptoms worsen or fail to improve as anticipated. The patient indicates understanding of these issues and agrees with the plan.

## 2017-06-10 NOTE — Assessment & Plan Note (Signed)
With ? Some unwanted facial movements- ? Due to abilify- refer to psychiatry to reassess her psych meds and will await input from her neurologist as well. The patient indicates understanding of these issues and agrees with the plan.

## 2017-06-10 NOTE — Progress Notes (Signed)
Subjective:   Patient ID: Nancy Hall, female    DOB: 1971-03-16, 47 y.o.   MRN: [id number]  Nancy Hall is a pleasant 47 y.o. year old female who presents to clinic today with Back Pain (Patient is here today C/O back pain.  This flare-up started at least 6-weeks-ago.  It radiates down into her left buttock but also states that both legs hurt all the way to her feet.) and Tremors (Patient is also here today C/O mouth tremors.  This started at least 1-year-ago but got increasingly bothersome in the last month.  Sometimes her teeth chatter.  States that she started the Neurontin about a year ago.  Started the Abilify about 3-years-ago )  on 06/10/2017  HPI:  Sciatica- left sided, intermittent issue.  Flared up again 6 weeks ago.   Radiates down her left buttocks into her left foot.  Neurontin did help for this in the past- still taking 300 mg nightly but still having symptoms.  No urinary symptoms.  No known injury.  No LE weakness.   She is also concerned about a facial tremor that has been ongoing for over a year but seems to be getting worse. Has been on abilify for years.  Has a neurologist but has not seen a psychiatrist in years.  Current Outpatient Medications on File Prior to Visit  Medication Sig Dispense Refill  . ARIPiprazole (ABILIFY) 15 MG tablet TAKE 1 TABLET BY MOUTH EVERY DAY 90 tablet 1  . clotrimazole (LOTRIMIN) 1 % cream Apply 1 application topically 2 (two) times daily. 30 g 0  . gabapentin (NEURONTIN) 300 MG capsule TAKE 1 CAPSULE BY MOUTH EVERYDAY AT BEDTIME 30 capsule 11  . hydrocortisone cream 0.5 % Apply 1 application topically 2 (two) times daily. 30 g 0  . lamoTRIgine (LAMICTAL) 150 MG tablet Take 1 tablet (150 mg total) by mouth 2 (two) times daily. 60 tablet 11  . levothyroxine (SYNTHROID, LEVOTHROID) 137 MCG tablet Take 1 tablet (137 mcg total) by mouth daily before breakfast. 60 tablet 5  . meloxicam (MOBIC) 7.5 MG tablet TAKE 1 TABLET BY MOUTH EVERY  DAY 30 tablet 5  . omeprazole (PRILOSEC) 40 MG capsule TAKE 1 CAPSULE (40 MG TOTAL) BY MOUTH DAILY. 90 capsule 0  . sertraline (ZOLOFT) 100 MG tablet Take 1 tablet (100 mg total) by mouth daily. 90 tablet 3  . topiramate (TOPAMAX) 100 MG tablet TAKE 1 TABLET IN THE MORNING AND 1 & 1/2 TABLETS AT NIGHT 75 tablet 11   No current facility-administered medications on file prior to visit.     Allergies  Allergen Reactions  . Penicillins Rash    Past Medical History:  Diagnosis Date  . Anemia 2016  . Anxiety   . Bipolar disorder ([place])   . Depression   . Esophagitis    LA Class A  . GERD (gastroesophageal reflux disease)   . Hiatal hernia   . Hypothyroidism   . Learning disability   . Obesity   . Pseudoseizures   . Seizure ([place]) 2014   last seizure 2(two) years ago  . Seizures ([place])    according to echart- seizures vs pseudoseizures  . Sleep apnea   . Thyroid disease     Past Surgical History:  Procedure Laterality Date  . ABDOMINAL HYSTERECTOMY N/A 01/09/2015   Procedure: HYSTERECTOMY ABDOMINAL/BILATERAL SALPINGECTOMY;  Surgeon: Rubie Maid, MD;  Location: [facility] ORS;  Service: Gynecology;  Laterality: N/A;  . DILATION AND CURETTAGE OF UTERUS    .  HYSTEROSCOPY W/D&C N/A 12/19/2014   Procedure: DILATATION AND CURETTAGE /HYSTEROSCOPY;  Surgeon: Rubie Maid, MD;  Location: [facility] ORS;  Service: Gynecology;  Laterality: N/A;  . INNER EAR SURGERY Bilateral 1994   poor historian  . TONSILLECTOMY      Family History  Problem Relation Age of Onset  . Colon polyps Mother   . Celiac disease Mother   . Colon polyps Father   . Celiac disease Brother   . Diabetes Sister   . Diabetes Paternal Grandmother   . Heart disease Paternal Grandfather   . Colon polyps Paternal Aunt   . Diabetes Maternal Aunt   . Heart disease Maternal Grandfather   . Heart disease Maternal Uncle   . Heart disease Maternal Aunt   . Seizures Neg Hx     Social History   Socioeconomic History  .  Marital status: Single    Spouse name: Not on file  . Number of children: 0  . Years of education: 10  . Highest education level: Not on file  Social Needs  . Financial resource strain: Not on file  . Food insecurity - worry: Not on file  . Food insecurity - inability: Not on file  . Transportation needs - medical: Not on file  . Transportation needs - non-medical: Not on file  Occupational History  . Not on file  Tobacco Use  . Smoking status: Never Smoker  . Smokeless tobacco: Never Used  Substance and Sexual Activity  . Alcohol use: No    Alcohol/week: 0.0 oz  . Drug use: No  . Sexual activity: No  Other Topics Concern  . Not on file  Social History Narrative   Patient lives at home with mom.    Patient does not have any children.    Patient has a 10th grade education.    Patient is single.    Patient is left handed.    Does not have a living will or HPOA- full code.   The PMH, PSH, Social History, Family History, Medications, and allergies have been reviewed in [facility], and have been updated if relevant.  Review of Systems  Musculoskeletal: Positive for back pain. Negative for gait problem, joint swelling, myalgias, neck pain and neck stiffness.  Neurological: Negative.   All other systems reviewed and are negative.      Objective:    BP 126/76 (BP Location: Left Arm, Patient Position: Sitting, Cuff Size: Large)   Pulse 86   Temp 98.4 F (36.9 C) (Oral)   Ht 5\' 6"  (1.676 m)   Wt 295 lb 12.8 oz (134.2 kg)   LMP 12/19/2014 (Exact Date)   SpO2 99%   BMI 47.74 kg/m    Physical Exam  Constitutional: She is oriented to person, place, and time. She appears well-developed and well-nourished. No distress.  HENT:  Head: Normocephalic and atraumatic.  Eyes: Conjunctivae are normal.  Cardiovascular: Normal rate.  Pulmonary/Chest: Effort normal.  Musculoskeletal:       Lumbar back: She exhibits tenderness and spasm.  +SLR right  Neurological: She is alert and  oriented to person, place, and time. No cranial nerve deficit.  Skin: Skin is warm. She is not diaphoretic.  Psychiatric: She has a normal mood and affect. Her behavior is normal. Judgment and thought content normal.  Nursing note and vitals reviewed.         Assessment & Plan:   Bipolar disorder in partial remission, most recent episode unspecified type [facility]) - Plan: Ambulatory referral to Psychiatry  Sciatic leg pain - Plan: DG Lumbar Spine Complete No Follow-up on file.

## 2017-07-21 ENCOUNTER — Ambulatory Visit (INDEPENDENT_AMBULATORY_CARE_PROVIDER_SITE_OTHER): Payer: Medicare Other | Admitting: Psychology

## 2017-07-21 DIAGNOSIS — F3132 Bipolar disorder, current episode depressed, moderate: Secondary | ICD-10-CM

## 2017-07-29 ENCOUNTER — Ambulatory Visit (INDEPENDENT_AMBULATORY_CARE_PROVIDER_SITE_OTHER): Payer: Medicare Other | Admitting: Family Medicine

## 2017-07-29 ENCOUNTER — Ambulatory Visit: Payer: Self-pay | Admitting: *Deleted

## 2017-07-29 ENCOUNTER — Encounter: Payer: Self-pay | Admitting: Family Medicine

## 2017-07-29 DIAGNOSIS — M545 Low back pain, unspecified: Secondary | ICD-10-CM | POA: Insufficient documentation

## 2017-07-29 DIAGNOSIS — M79605 Pain in left leg: Secondary | ICD-10-CM

## 2017-07-29 DIAGNOSIS — R600 Localized edema: Secondary | ICD-10-CM | POA: Diagnosis not present

## 2017-07-29 LAB — CBC
HCT: 35.9 % — ABNORMAL LOW (ref 36.0–46.0)
Hemoglobin: 11.6 g/dL — ABNORMAL LOW (ref 12.0–15.0)
MCHC: 32.2 g/dL (ref 30.0–36.0)
MCV: 94.5 fl (ref 78.0–100.0)
Platelets: 181 10*3/uL (ref 150.0–400.0)
RBC: 3.8 Mil/uL — AB (ref 3.87–5.11)
RDW: 14.4 % (ref 11.5–15.5)
WBC: 6.5 10*3/uL (ref 4.0–10.5)

## 2017-07-29 LAB — COMPREHENSIVE METABOLIC PANEL
ALT: 16 U/L (ref 0–35)
AST: 14 U/L (ref 0–37)
Albumin: 3.9 g/dL (ref 3.5–5.2)
Alkaline Phosphatase: 76 U/L (ref 39–117)
BUN: 10 mg/dL (ref 6–23)
CHLORIDE: 107 meq/L (ref 96–112)
CO2: 25 meq/L (ref 19–32)
CREATININE: 0.95 mg/dL (ref 0.40–1.20)
Calcium: 8.6 mg/dL (ref 8.4–10.5)
GFR: 67.67 mL/min (ref >60.00)
GLUCOSE: 97 mg/dL (ref 70–99)
Potassium: 3.9 mEq/L (ref 3.5–5.1)
SODIUM: 140 meq/L (ref 135–145)
Total Bilirubin: 0.4 mg/dL (ref 0.2–1.2)
Total Protein: 6.4 g/dL (ref 6.0–8.3)

## 2017-07-29 LAB — BRAIN NATRIURETIC PEPTIDE: PRO B NATRI PEPTIDE: 99 pg/mL (ref 0.0–100.0)

## 2017-07-29 LAB — TSH: TSH: 3.86 u[IU]/mL (ref 0.35–4.50)

## 2017-07-29 MED ORDER — PREDNISONE 10 MG PO TABS: ORAL_TABLET | ORAL | 0 refills | Status: DC

## 2017-07-29 NOTE — Patient Instructions (Addendum)
Great to see you. I will call you with your lab results from today and you can view them online.   Please make an appointment to see Dr. Raeford Razor on your way out.

## 2017-07-29 NOTE — Telephone Encounter (Signed)
Pt's mother, Estill Bamberg calling stating that the pt is feeling weak and has no energy. Also notes that the pt has back pain and bilateral leg swelling. BP 138/84 and P-87. Pt denies shortness of breath, chest pain or any other symptoms at this time. Pt denies any dizziness and is able to walk with out assistance. Pt's mother also reports that the pt has a history of anxiety and is currently taking medication to help with symptoms, but usually does not take this medication until 8pm.Pt has been able to eat today without difficulty and pt's mom states that the pt ate a chicken sandwich this evening.Pt seen today for an OV with Dr. Deborra Medina and issues of back pain and bilateral swelling were addressed during the visit. Pt also has visit scheduled for Dr. Raeford Razor in the morning for evaluation of back pain.  Pt's mother, Estill Bamberg asking if she needed to have the pt come in for another visit. Pt's mother advised that the pt should be ok to rest at home at this time as long as she was not voicing any other symptoms. Advised pt's mother that if the pt began to develop symptoms such as a SBP<90, HR<60, dizziness or pt began to feel worse to call the office back for additional advice or take the pt to the ED. Understanding verbalized.   Reason for Disposition . Mild weakness or fatigue with acute minor illness (e.g., colds)  Protocols used: WEAKNESS (GENERALIZED) AND FATIGUE-A-AH

## 2017-07-29 NOTE — Progress Notes (Signed)
Subjective:   Patient ID: Nancy Hall, female    DOB: [birth year], 47 y.o.   MRN: [id number]  Nancy Hall is a pleasant 47 y.o. year old female who presents to clinic today with Leg Swelling  on 07/29/2017  HPI:  Bilateral edema-  Past month, increased bilateral edema, pitting.  No CP or SOB. Does keep her legs elevated when she is in her recliner.  She also has been feeling "shaky."  She is on synthroid- managed by Dr. Cruzita Lederer.  Lab Results  Component Value Date   TSH 2.49 06/02/2017   Still having left lower back pain that radiates down her left lateral thigh. Mobic has not helped. Current Outpatient Medications on File Prior to Visit  Medication Sig Dispense Refill  . ARIPiprazole (ABILIFY) 15 MG tablet TAKE 1 TABLET BY MOUTH EVERY DAY 90 tablet 1  . clotrimazole (LOTRIMIN) 1 % cream Apply 1 application topically 2 (two) times daily. 30 g 0  . gabapentin (NEURONTIN) 300 MG capsule TAKE 1 CAPSULE BY MOUTH EVERYDAY AT BEDTIME 30 capsule 11  . hydrocortisone cream 0.5 % Apply 1 application topically 2 (two) times daily. 30 g 0  . lamoTRIgine (LAMICTAL) 150 MG tablet Take 1 tablet (150 mg total) by mouth 2 (two) times daily. 60 tablet 11  . levothyroxine (SYNTHROID, LEVOTHROID) 137 MCG tablet Take 1 tablet (137 mcg total) by mouth daily before breakfast. 60 tablet 5  . omeprazole (PRILOSEC) 40 MG capsule TAKE 1 CAPSULE (40 MG TOTAL) BY MOUTH DAILY. 90 capsule 0  . sertraline (ZOLOFT) 100 MG tablet Take 1 tablet (100 mg total) by mouth daily. 90 tablet 3  . topiramate (TOPAMAX) 100 MG tablet TAKE 1 TABLET IN THE MORNING AND 1 & 1/2 TABLETS AT NIGHT 75 tablet 11   No current facility-administered medications on file prior to visit.     Allergies  Allergen Reactions  . Penicillins Rash    Past Medical History:  Diagnosis Date  . Anemia 2016  . Anxiety   . Bipolar disorder ([place])   . Depression   . Esophagitis    LA Class A  . GERD (gastroesophageal reflux disease)     . Hiatal hernia   . Hypothyroidism   . Learning disability   . Obesity   . Pseudoseizures   . Seizure ([place]) 2014   last seizure 2(two) years ago  . Seizures ([place])    according to echart- seizures vs pseudoseizures  . Sleep apnea   . Thyroid disease     Past Surgical History:  Procedure Laterality Date  . ABDOMINAL HYSTERECTOMY N/A 01/09/2015   Procedure: HYSTERECTOMY ABDOMINAL/BILATERAL SALPINGECTOMY;  Surgeon: Rubie Maid, MD;  Location: [facility] ORS;  Service: Gynecology;  Laterality: N/A;  . DILATION AND CURETTAGE OF UTERUS    . HYSTEROSCOPY W/D&C N/A 12/19/2014   Procedure: DILATATION AND CURETTAGE /HYSTEROSCOPY;  Surgeon: Rubie Maid, MD;  Location: [facility] ORS;  Service: Gynecology;  Laterality: N/A;  . INNER EAR SURGERY Bilateral 1994   poor historian  . TONSILLECTOMY      Family History  Problem Relation Age of Onset  . Colon polyps Mother   . Celiac disease Mother   . Colon polyps Father   . Celiac disease Brother   . Diabetes Sister   . Diabetes Paternal Grandmother   . Heart disease Paternal Grandfather   . Colon polyps Paternal Aunt   . Diabetes Maternal Aunt   . Heart disease Maternal Grandfather   . Heart disease Maternal  Uncle   . Heart disease Maternal Aunt   . Seizures Neg Hx     Social History   Socioeconomic History  . Marital status: Single    Spouse name: Not on file  . Number of children: 0  . Years of education: 10  . Highest education level: Not on file  Occupational History  . Not on file  Social Needs  . Financial resource strain: Not on file  . Food insecurity:    Worry: Not on file    Inability: Not on file  . Transportation needs:    Medical: Not on file    Non-medical: Not on file  Tobacco Use  . Smoking status: Never Smoker  . Smokeless tobacco: Never Used  Substance and Sexual Activity  . Alcohol use: No    Alcohol/week: 0.0 oz  . Drug use: No  . Sexual activity: Never  Lifestyle  . Physical activity:    Days per week:  Not on file    Minutes per session: Not on file  . Stress: Not on file  Relationships  . Social connections:    Talks on phone: Not on file    Gets together: Not on file    Attends religious service: Not on file    Active member of club or organization: Not on file    Attends meetings of clubs or organizations: Not on file    Relationship status: Not on file  . Intimate partner violence:    Fear of current or ex partner: Not on file    Emotionally abused: Not on file    Physically abused: Not on file    Forced sexual activity: Not on file  Other Topics Concern  . Not on file  Social History Narrative   Patient lives at home with mom.    Patient does not have any children.    Patient has a 10th grade education.    Patient is single.    Patient is left handed.    Does not have a living will or HPOA- full code.   The PMH, PSH, Social History, Family History, Medications, and allergies have been reviewed in [facility], and have been updated if relevant.   Review of Systems  Eyes: Negative.   Respiratory: Negative.   Cardiovascular: Positive for leg swelling. Negative for chest pain and palpitations.  Gastrointestinal: Negative.   Musculoskeletal: Positive for back pain.  Skin: Negative.   Allergic/Immunologic: Negative.   Neurological: Negative.   Hematological: Negative.   Psychiatric/Behavioral: Negative.   All other systems reviewed and are negative.      Objective:    BP (!) 130/96 (BP Location: Left Arm, Cuff Size: Large)   Pulse 85   Temp 98.2 F (36.8 C) (Oral)   Ht 5\' 6"  (1.676 m)   Wt (!) 300 lb 6.4 oz (136.3 kg)   LMP 12/19/2014 (Exact Date)   SpO2 97%   BMI 48.49 kg/m    Physical Exam  Constitutional: She is oriented to person, place, and time. She appears well-developed and well-nourished. No distress.  HENT:  Head: Normocephalic and atraumatic.  Cardiovascular: Normal rate.  Pulmonary/Chest: Effort normal and breath sounds normal.  Musculoskeletal:  Normal range of motion.  Bilateral 1+ pitting edema LE  Neurological: She is alert and oriented to person, place, and time. She displays normal reflexes. No cranial nerve deficit. Coordination normal.  Skin: Skin is warm and dry. She is not diaphoretic.  Psychiatric: She has a normal mood and affect.  Her behavior is normal. Judgment and thought content normal.  Nursing note and vitals reviewed.         Assessment & Plan:   Low back pain radiating to left leg - Plan: B Nat Peptide, TSH, CBC, Comprehensive metabolic panel  Bilateral edema of lower extremity No follow-ups on file.

## 2017-07-29 NOTE — Assessment & Plan Note (Signed)
Lumbar xray in 05/2017- DDD, old compression fx. Will have her see Dr. Raeford Razor.  I am concerned about giving her anything stronger than mobic at this time given her other symptoms today.

## 2017-07-29 NOTE — Assessment & Plan Note (Addendum)
New- check labs today as part of initial work up. My concern is that this is new- differential dx is wide. Orders Placed This Encounter  Procedures  . B Nat Peptide  . TSH  . CBC  . Comprehensive metabolic panel

## 2017-07-30 ENCOUNTER — Encounter: Payer: Self-pay | Admitting: Family Medicine

## 2017-07-30 ENCOUNTER — Other Ambulatory Visit: Payer: Self-pay | Admitting: Internal Medicine

## 2017-07-30 ENCOUNTER — Telehealth: Payer: Self-pay | Admitting: Family Medicine

## 2017-07-30 ENCOUNTER — Ambulatory Visit (INDEPENDENT_AMBULATORY_CARE_PROVIDER_SITE_OTHER): Payer: Medicare Other | Admitting: Family Medicine

## 2017-07-30 VITALS — BP 132/66 | HR 80 | Temp 98.8°F | Ht 66.0 in | Wt 300.0 lb

## 2017-07-30 DIAGNOSIS — M79605 Pain in left leg: Secondary | ICD-10-CM

## 2017-07-30 DIAGNOSIS — M545 Low back pain, unspecified: Secondary | ICD-10-CM

## 2017-07-30 MED ORDER — PREDNISONE 5 MG PO TABS: ORAL_TABLET | ORAL | 0 refills | Status: DC

## 2017-07-30 NOTE — Patient Instructions (Signed)
Please try the exercises I have provided you Please try to stay active  Please follow up with me in 3-4 weeks if your symptoms haven't improved.

## 2017-07-30 NOTE — Telephone Encounter (Signed)
Copied from [place] [phone]. Topic: Quick Communication - See Telephone Encounter >> Jul 30, 2017  1:07 PM Robina Ade, Helene Kelp D wrote: CRM for notification. See Telephone encounter for: 07/30/17. Loanne Drilling, patient mom called and would like to talk to Dr. Deborra Medina about the medication she was put on by today for lower back pain. Please call Mrs. Evelyn back, thanks.

## 2017-07-30 NOTE — Assessment & Plan Note (Addendum)
Pain doesn't seem to be originating from her compression fracture. Pain seems more axial and more radicular in nature.  - prednisone  - counseled on HEP  - if no improvement could consider gabapentin but is on anti-epileptics that may cause more drowsiness. Could consider referral to PT. Weight loss would help if able.

## 2017-07-30 NOTE — Progress Notes (Signed)
Nancy Hall - 47 y.o. female MRN [id number]  Date of birth: 13-Sep-1970  SUBJECTIVE:  Including CC & ROS.  Chief Complaint  Patient presents with  . Back Pain    Nancy Hall is a 47 y.o. female that is presenting with back and hip pain. Pain has been ongoing for one month. Pain is located lower left back and radiates to her left hip. Pain is constant. Denies movements that exacerbate the pain. Pain is mild to severe when walking. She has been taking motrin for the pain. Denies surgeries or injuries.  She reports having a history of similar pain in the past.  She has done physical therapy previously.  She is reporting the pain to be intermittent in nature.  Denies any foot drop.  No numbness.    Review of Systems  Constitutional: Negative for fever.  HENT: Negative for congestion.   Respiratory: Negative for cough.   Cardiovascular: Negative for chest pain.  Gastrointestinal: Negative for abdominal pain.  Musculoskeletal: Positive for back pain.  Skin: Negative for color change.  Neurological: Negative for weakness.  Hematological: Negative for adenopathy.  Psychiatric/Behavioral: Negative for agitation.    HISTORY: Past Medical, Surgical, Social, and Family History Reviewed & Updated per EMR.   Pertinent Historical Findings include:  Past Medical History:  Diagnosis Date  . Anemia 2016  . Anxiety   . Bipolar disorder ([place])   . Depression   . Esophagitis    LA Class A  . GERD (gastroesophageal reflux disease)   . Hiatal hernia   . Hypothyroidism   . Learning disability   . Obesity   . Pseudoseizures   . Seizure ([place]) 2014   last seizure 2(two) years ago  . Seizures ([place])    according to echart- seizures vs pseudoseizures  . Sleep apnea   . Thyroid disease     Past Surgical History:  Procedure Laterality Date  . ABDOMINAL HYSTERECTOMY N/A 01/09/2015   Procedure: HYSTERECTOMY ABDOMINAL/BILATERAL SALPINGECTOMY;  Surgeon: Rubie Maid, MD;  Location: [facility] ORS;   Service: Gynecology;  Laterality: N/A;  . DILATION AND CURETTAGE OF UTERUS    . HYSTEROSCOPY W/D&C N/A 12/19/2014   Procedure: DILATATION AND CURETTAGE /HYSTEROSCOPY;  Surgeon: Rubie Maid, MD;  Location: [facility] ORS;  Service: Gynecology;  Laterality: N/A;  . INNER EAR SURGERY Bilateral 1994   poor historian  . TONSILLECTOMY      Allergies  Allergen Reactions  . Penicillins Rash    Family History  Problem Relation Age of Onset  . Colon polyps Mother   . Celiac disease Mother   . Colon polyps Father   . Celiac disease Brother   . Diabetes Sister   . Diabetes Paternal Grandmother   . Heart disease Paternal Grandfather   . Colon polyps Paternal Aunt   . Diabetes Maternal Aunt   . Heart disease Maternal Grandfather   . Heart disease Maternal Uncle   . Heart disease Maternal Aunt   . Seizures Neg Hx      Social History   Socioeconomic History  . Marital status: Single    Spouse name: Not on file  . Number of children: 0  . Years of education: 10  . Highest education level: Not on file  Occupational History  . Not on file  Social Needs  . Financial resource strain: Not on file  . Food insecurity:    Worry: Not on file    Inability: Not on file  . Transportation needs:  Medical: Not on file    Non-medical: Not on file  Tobacco Use  . Smoking status: Never Smoker  . Smokeless tobacco: Never Used  Substance and Sexual Activity  . Alcohol use: No    Alcohol/week: 0.0 oz  . Drug use: No  . Sexual activity: Never  Lifestyle  . Physical activity:    Days per week: Not on file    Minutes per session: Not on file  . Stress: Not on file  Relationships  . Social connections:    Talks on phone: Not on file    Gets together: Not on file    Attends religious service: Not on file    Active member of club or organization: Not on file    Attends meetings of clubs or organizations: Not on file    Relationship status: Not on file  . Intimate partner violence:    Fear of  current or ex partner: Not on file    Emotionally abused: Not on file    Physically abused: Not on file    Forced sexual activity: Not on file  Other Topics Concern  . Not on file  Social History Narrative   Patient lives at home with mom.    Patient does not have any children.    Patient has a 10th grade education.    Patient is single.    Patient is left handed.    Does not have a living will or HPOA- full code.     PHYSICAL EXAM:  VS: BP 132/66 (BP Location: Left Arm, Patient Position: Sitting, Cuff Size: Large)   Pulse 80   Temp 98.8 F (37.1 C) (Oral)   Ht 5\' 6"  (1.676 m)   Wt 300 lb (136.1 kg)   LMP 12/19/2014 (Exact Date)   SpO2 98%   BMI 48.42 kg/m  Physical Exam Gen: NAD, alert, cooperative with exam, well-appearing ENT: normal lips, normal nasal mucosa,  Eye: normal EOM, normal conjunctiva and lids CV:  no edema, +2 pedal pulses   Resp: no accessory muscle use, non-labored,  Skin: no rashes, no areas of induration  Neuro: normal tone, normal sensation to touch Psych:  normal insight, alert and oriented MSK:  Back Exam:  Inspection: Unremarkable  Palpable tenderness: minimal TTP over the GT, piriformis, lumbar paraspinal muscles,  No TTP of the midline lumbar spine  Normal IR and ER of the hips  Leg strength: Quad: 5/5 Hamstring: 5/5 Hip flexor: 5/5  Strength at foot: Plantar-flexion: 5/5 Dorsi-flexion: 5/5 Eversion: 5/5 Inversion: 5/5  Gait unremarkable. SLR laying: Negative  XSLR laying: Negative  Neurovascularly intact    ASSESSMENT & PLAN:   Low back pain radiating to left leg Pain doesn't seem to be originating from her compression fracture. Pain seems more axial and more radicular in nature.  - prednisone  - counseled on HEP  - if no improvement could consider gabapentin but is on anti-epileptics that may cause more drowsiness. Could consider referral to PT. Weight loss would help if able.

## 2017-07-31 NOTE — Telephone Encounter (Signed)
Per discussion with TA can try Gaba bid/Spoke with mom and she said will try the Gaba bid for a week and see if that is helpful and will call back if not/thx dmf

## 2017-07-31 NOTE — Telephone Encounter (Signed)
TA-Mom states that pt was Rx'ed Prednisone dose pack at visit yesterday with JS but this causes lots of problems for her and cannot take it/she is doing the exercises he said but she is still in significant pain/what do you suggest?  Also; swelling stays controlled only if feet are elevated but then starts over again/plz advise on both/thx dmf

## 2017-07-31 NOTE — Telephone Encounter (Signed)
For the swelling, I suggest compression hose like we discussed yesterday.  Has she tried this?  For the pain, did mobic help at all?

## 2017-08-08 ENCOUNTER — Ambulatory Visit (INDEPENDENT_AMBULATORY_CARE_PROVIDER_SITE_OTHER): Payer: Medicare Other | Admitting: Psychology

## 2017-08-08 DIAGNOSIS — F3132 Bipolar disorder, current episode depressed, moderate: Secondary | ICD-10-CM | POA: Diagnosis not present

## 2017-08-20 ENCOUNTER — Ambulatory Visit: Payer: Medicare Other | Admitting: Psychology

## 2017-09-03 ENCOUNTER — Ambulatory Visit: Payer: Medicare Other | Admitting: Psychology

## 2017-09-07 ENCOUNTER — Emergency Department (HOSPITAL_COMMUNITY)
Admission: EM | Admit: 2017-09-07 | Discharge: 2017-09-07 | Disposition: A | Discharge status: Home / Self Care | Payer: Medicare Other | Attending: Emergency Medicine | Admitting: Emergency Medicine

## 2017-09-07 ENCOUNTER — Encounter (HOSPITAL_COMMUNITY): Payer: Self-pay | Admitting: Emergency Medicine

## 2017-09-07 DIAGNOSIS — K439 Ventral hernia without obstruction or gangrene: Secondary | ICD-10-CM | POA: Diagnosis not present

## 2017-09-07 DIAGNOSIS — R21 Rash and other nonspecific skin eruption: Secondary | ICD-10-CM | POA: Insufficient documentation

## 2017-09-07 DIAGNOSIS — E039 Hypothyroidism, unspecified: Secondary | ICD-10-CM | POA: Insufficient documentation

## 2017-09-07 DIAGNOSIS — Z79899 Other long term (current) drug therapy: Secondary | ICD-10-CM | POA: Diagnosis not present

## 2017-09-07 DIAGNOSIS — F79 Unspecified intellectual disabilities: Secondary | ICD-10-CM | POA: Diagnosis not present

## 2017-09-07 MED ORDER — VALACYCLOVIR HCL 1 G PO TABS: 1000.0000 mg | ORAL_TABLET | Freq: Three times a day (TID) | ORAL | 0 refills | Status: DC

## 2017-09-07 NOTE — ED Provider Notes (Signed)
[place] EMERGENCY DEPARTMENT Provider Note   CSN: [id number] Arrival date & time: 09/07/17  1815     History   Chief Complaint Chief Complaint  Patient presents with  . Hernia  . Rash    HPI Nancy Hall is a 47 y.o. female.  HPI 47 year old female comes in today complaining of rash to right upper quadrant.  She states there has been itching there for several days.  She has had some scratching at the area.  She denies any previous history of allergic reactions or exposure to new possible allergens.  Today she began having some pain that she describes as in the epigastric area and sharp and "outside".  It is worse with coughing and some movement she denies nausea, vomiting, diarrhea, fever, chills, dyspnea, or chest pain.  She does not have regular menstrual cycle secondary to direct me in the past. Past Medical History:  Diagnosis Date  . Anemia 2016  . Anxiety   . Bipolar disorder ([place])   . Depression   . Esophagitis    LA Class A  . GERD (gastroesophageal reflux disease)   . Hiatal hernia   . Hypothyroidism   . Learning disability   . Obesity   . Pseudoseizures   . Seizure ([place]) 2014   last seizure 2(two) years ago  . Seizures ([place])    according to echart- seizures vs pseudoseizures  . Sleep apnea   . Thyroid disease     Patient Active Problem List   Diagnosis Date Noted  . Low back pain radiating to left leg 07/29/2017  . Bilateral edema of lower extremity 07/29/2017  . Dysuria 03/04/2017  . Constipation 03/04/2017  . Hot flashes 02/24/2017  . Sciatic leg pain 07/10/2016  . Morbid obesity ([place]) 04/05/2014  . Hyperglycemia 09/21/2013  . Dyslipidemia 09/14/2013  . 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 Hypothyroidism 05/30/2011  . 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 MR (mental retardation) 05/30/2011  . Bipolar disorder ([place]) 05/30/2011  . Sleep apnea  05/30/2011  . Seizures ([place])     Past Surgical History:  Procedure Laterality Date  . ABDOMINAL HYSTERECTOMY N/A 01/09/2015   Procedure: HYSTERECTOMY ABDOMINAL/BILATERAL SALPINGECTOMY;  Surgeon: Rubie Maid, MD;  Location: [facility] ORS;  Service: Gynecology;  Laterality: N/A;  . DILATION AND CURETTAGE OF UTERUS    . HYSTEROSCOPY W/D&C N/A 12/19/2014   Procedure: DILATATION AND CURETTAGE /HYSTEROSCOPY;  Surgeon: Rubie Maid, MD;  Location: [facility] ORS;  Service: Gynecology;  Laterality: N/A;  . INNER EAR SURGERY Bilateral 1994   poor historian  . TONSILLECTOMY       OB History    Gravida  0   Para  0   Term  0   Preterm  0   AB  0   Living  0     SAB  0   TAB  0   Ectopic  0   Multiple  0   Live Births               Home Medications    Prior to Admission medications   Medication Sig Start Date End Date Taking? Authorizing Provider  ARIPiprazole (ABILIFY) 15 MG tablet TAKE 1 TABLET BY MOUTH EVERY DAY 02/27/17  Yes Lucille Passy, MD  gabapentin (NEURONTIN) 300 MG capsule TAKE 1 CAPSULE BY MOUTH EVERYDAY AT BEDTIME 01/08/17  Yes Lucille Passy, MD  lamoTRIgine (LAMICTAL) 150 MG tablet Take 1 tablet (  150 mg total) by mouth 2 (two) times daily. 09/23/16  Yes Dennie Bible, NP  levothyroxine (SYNTHROID, LEVOTHROID) 137 MCG tablet TAKE 1 TABLET (137 MCG TOTAL) BY MOUTH DAILY BEFORE BREAKFAST. 07/30/17  Yes Philemon Kingdom, MD  omeprazole (PRILOSEC) 40 MG capsule TAKE 1 CAPSULE (40 MG TOTAL) BY MOUTH DAILY. 06/03/16  Yes Lucille Passy, MD  sertraline (ZOLOFT) 100 MG tablet Take 1 tablet (100 mg total) by mouth daily. 03/27/17  Yes Lucille Passy, MD  topiramate (TOPAMAX) 100 MG tablet TAKE 1 TABLET IN THE MORNING AND 1 & 1/2 TABLETS AT NIGHT 09/23/16  Yes Dennie Bible, NP  clotrimazole (LOTRIMIN) 1 % cream Apply 1 application topically 2 (two) times daily. Patient not taking: Reported on 09/07/2017 08/20/16   Lucille Passy, MD  hydrocortisone cream 0.5 % Apply 1  application topically 2 (two) times daily. Patient not taking: Reported on 09/07/2017 06/27/15   Lucille Passy, MD  predniSONE (DELTASONE) 5 MG tablet Take 6 pills for first day, 5 pills second day, 4 pills third day, 3 pills fourth day, 2 pills the fifth day, and 1 pill sixth day. Patient not taking: Reported on 09/07/2017 07/30/17   Rosemarie Ax, MD    Family History Family History  Problem Relation Age of Onset  . Colon polyps Mother   . Celiac disease Mother   . Colon polyps Father   . Celiac disease Brother   . Diabetes Sister   . Diabetes Paternal Grandmother   . Heart disease Paternal Grandfather   . Colon polyps Paternal Aunt   . Diabetes Maternal Aunt   . Heart disease Maternal Grandfather   . Heart disease Maternal Uncle   . Heart disease Maternal Aunt   . Seizures Neg Hx     Social History Social History   Tobacco Use  . Smoking status: Never Smoker  . Smokeless tobacco: Never Used  Substance Use Topics  . Alcohol use: No    Alcohol/week: 0.0 oz  . Drug use: No     Allergies   Penicillins   Review of Systems Review of Systems  All other systems reviewed and are negative.    Physical Exam Updated Vital Signs BP (!) 160/99 (BP Location: Right Arm)   Pulse 96   Temp 98.1 F (36.7 C) (Oral)   Resp 20   Ht 1.676 m (5\' 6" )   Wt 136.1 kg (300 lb)   LMP 12/19/2014 (Exact Date)   SpO2 100%   BMI 48.42 kg/m   Physical Exam  Constitutional: She is oriented to person, place, and time. She appears well-developed and well-nourished. No distress.  Morbidly obese  HENT:  Head: Normocephalic and atraumatic.  Right Ear: External ear normal.  Left Ear: External ear normal.  Mouth/Throat: Oropharynx is clear and moist.  Eyes: Pupils are equal, round, and reactive to light. EOM are normal.  Neck: Normal range of motion. Neck supple.  Cardiovascular: Normal rate, regular rhythm and normal heart sounds.  Pulmonary/Chest: Effort normal.  Abdominal: Soft.  Bowel sounds are normal. She exhibits no distension and no mass. There is no tenderness. There is no rebound and no guarding.    Excoriated area right upper quadrant Possible ventral defect palpated epigastrium  Musculoskeletal: Normal range of motion.  Neurological: She is alert and oriented to person, place, and time.  Skin: Skin is warm and dry. Capillary refill takes less than 2 seconds.  Psychiatric: She has a normal mood and affect.  Nursing  note and vitals reviewed.    ED Treatments / Results  Labs (all labs ordered are listed, but only abnormal results are displayed) Labs Reviewed - No data to display  EKG None  Radiology No results found.  Procedures Procedures (including critical care time)  Medications Ordered in ED Medications - No data to display   Initial Impression / Assessment and Plan / ED Course  I have reviewed the triage vital signs and the nursing notes.  Pertinent labs & imaging results that were available during my care of the patient were reviewed by me and considered in my medical decision making (see chart for details).     Patient here with excoriated area right upper quadrant with differential diagnosis of infection-zoster versus cellulitis, versus allergic reaction including dermatitis.  Does not appear cellulitic and patient does not have any spreading erythema or significant tenderness in this area.  Will treat for possible beginnings of zoster.  Also epigastric area appears with some ventral defect.  I discussed possible ventral hernia versus zoster.  Patient is not vomiting and appears hemodynamically stable without any significant abdominal tenderness.  Plan treatment for zoster, return precautions and close follow-up.  Patient voiced understanding.  Final Clinical Impressions(s) / ED Diagnoses   Final diagnoses:  Rash  Ventral hernia without obstruction or gangrene    ED Discharge Orders        Ordered    valACYclovir (VALTREX) 1000  MG tablet  3 times daily     09/07/17 2023       Pattricia Boss, MD 09/07/17 2024

## 2017-09-07 NOTE — Discharge Instructions (Addendum)
Please take medication as prescribed Please recheck with your doctor in the next 1 to 3 days Please return if you have worsening abdominal pain, inability to tolerate fluids, or fever

## 2017-09-07 NOTE — ED Notes (Signed)
E-Signature not available. Patient/caregiver verbalizes understanding of discharge instructions and new medications. No further questions at this time.

## 2017-09-07 NOTE — ED Triage Notes (Signed)
Pt has a rash to right side of abdomen and a small lump to center of abdomen. No redness to the lump noted, no drainage.

## 2017-09-08 ENCOUNTER — Encounter: Payer: Self-pay | Admitting: Family Medicine

## 2017-09-08 ENCOUNTER — Ambulatory Visit (INDEPENDENT_AMBULATORY_CARE_PROVIDER_SITE_OTHER): Payer: Medicare Other | Admitting: Family Medicine

## 2017-09-08 VITALS — BP 134/88 | HR 80 | Temp 98.6°F | Ht 66.0 in | Wt 299.8 lb

## 2017-09-08 DIAGNOSIS — R21 Rash and other nonspecific skin eruption: Secondary | ICD-10-CM

## 2017-09-08 DIAGNOSIS — K439 Ventral hernia without obstruction or gangrene: Secondary | ICD-10-CM

## 2017-09-08 MED ORDER — VALACYCLOVIR HCL 1 G PO TABS: 1000.0000 mg | ORAL_TABLET | Freq: Three times a day (TID) | ORAL | 0 refills | Status: DC

## 2017-09-08 NOTE — Assessment & Plan Note (Signed)
Agree with ER that rash appears to be a zoster like distribution and symptoms. eRx sent for valtrex to her pharmacy as she did not fill rx prior to seeing me. Call or return to clinic prn if these symptoms worsen or fail to improve as anticipated.

## 2017-09-08 NOTE — Assessment & Plan Note (Signed)
Discussed hernias and red flag symptoms concerning for incarceration/strangulation. Refer to general surgery for further management. The patient indicates understanding of these issues and agrees with the plan.

## 2017-09-08 NOTE — Patient Instructions (Signed)
Good to see you. Take valtrex as directed- 1 tablet three times daily for 7 days.  We will call you with an appointment to see a surgeon but please go to the ER if you belly gets hard and starts to hurt again.

## 2017-09-08 NOTE — Progress Notes (Signed)
Subjective:   Patient ID: Nancy Hall, female    DOB: [birth year], 47 y.o.   MRN: [id number]  Nancy Hall is a pleasant 47 y.o. year old female who presents to clinic today with Follow-up (Patient is here today to F/U after being seen at ED on 6.9.19.  She went to [facility] for a rash that was present for several days and sharp epigastric pain.  Per ED note "Tx for possible Zoster.  Epigastric appears with some ventral defect and discussed possible ventral hernia vs Zoster."  Given Valtrex 1k tid.  Mom did not get Valtrex filled and wants Dr. Hulen Shouts opinion on it.)  on 09/08/2017  HPI:  ER follow up-  Was seen at [facility] ED yesterday for a rash and abdominal pain. Note reviewed.  Assessment and plan was as follows:   Patient here with excoriated area right upper quadrant with differential diagnosis of infection-zoster versus cellulitis, versus allergic reaction including dermatitis.  Does not appear cellulitic and patient does not have any spreading erythema or significant tenderness in this area.  Will treat for possible beginnings of zoster.  Also epigastric area appears with some ventral defect.  I discussed possible ventral hernia versus zoster.  Patient is not vomiting and appears hemodynamically stable without any significant abdominal tenderness.  Plan treatment for zoster, return precautions and close follow-up.  Patient voiced understanding.  Given Valtrex 1000 mg three times daily but her mom did not pick it up from the pharmacy.  She wanted my opinion first.  Rash is itchy and painful.  Current Outpatient Medications on File Prior to Visit  Medication Sig Dispense Refill  . ARIPiprazole (ABILIFY) 15 MG tablet TAKE 1 TABLET BY MOUTH EVERY DAY 90 tablet 1  . clotrimazole (LOTRIMIN) 1 % cream Apply 1 application topically 2 (two) times daily. 30 g 0  . gabapentin (NEURONTIN) 300 MG capsule TAKE 1 CAPSULE BY MOUTH EVERYDAY AT BEDTIME 30 capsule 11  . hydrocortisone cream 0.5 %  Apply 1 application topically 2 (two) times daily. 30 g 0  . lamoTRIgine (LAMICTAL) 150 MG tablet Take 1 tablet (150 mg total) by mouth 2 (two) times daily. 60 tablet 11  . levothyroxine (SYNTHROID, LEVOTHROID) 137 MCG tablet TAKE 1 TABLET (137 MCG TOTAL) BY MOUTH DAILY BEFORE BREAKFAST. 60 tablet 3  . omeprazole (PRILOSEC) 40 MG capsule TAKE 1 CAPSULE (40 MG TOTAL) BY MOUTH DAILY. 90 capsule 0  . sertraline (ZOLOFT) 100 MG tablet Take 1 tablet (100 mg total) by mouth daily. 90 tablet 3  . topiramate (TOPAMAX) 100 MG tablet TAKE 1 TABLET IN THE MORNING AND 1 & 1/2 TABLETS AT NIGHT 75 tablet 11   No current facility-administered medications on file prior to visit.     Allergies  Allergen Reactions  . Penicillins Rash    Has patient had a PCN reaction causing immediate rash, facial/tongue/throat swelling, SOB or lightheadedness with hypotension: Yes Has patient had a PCN reaction causing severe rash involving mucus membranes or skin necrosis: No Has patient had a PCN reaction that required hospitalization: No Has patient had a PCN reaction occurring within the last 10 years: Yes If all of the above answers are "NO", then may proceed with Cephalosporin use.     Past Medical History:  Diagnosis Date  . Anemia 2016  . Anxiety   . Bipolar disorder ([place])   . Depression   . Esophagitis    LA Class A  . GERD (gastroesophageal reflux disease)   .  Hiatal hernia   . Hypothyroidism   . Learning disability   . Obesity   . Pseudoseizures   . Seizure ([place]) 2014   last seizure 2(two) years ago  . Seizures ([place])    according to echart- seizures vs pseudoseizures  . Sleep apnea   . Thyroid disease     Past Surgical History:  Procedure Laterality Date  . ABDOMINAL HYSTERECTOMY N/A 01/09/2015   Procedure: HYSTERECTOMY ABDOMINAL/BILATERAL SALPINGECTOMY;  Surgeon: Rubie Maid, MD;  Location: [facility] ORS;  Service: Gynecology;  Laterality: N/A;  . DILATION AND CURETTAGE OF UTERUS    .  HYSTEROSCOPY W/D&C N/A 12/19/2014   Procedure: DILATATION AND CURETTAGE /HYSTEROSCOPY;  Surgeon: Rubie Maid, MD;  Location: [facility] ORS;  Service: Gynecology;  Laterality: N/A;  . INNER EAR SURGERY Bilateral 1994   poor historian  . TONSILLECTOMY      Family History  Problem Relation Age of Onset  . Colon polyps Mother   . Celiac disease Mother   . Colon polyps Father   . Celiac disease Brother   . Diabetes Sister   . Diabetes Paternal Grandmother   . Heart disease Paternal Grandfather   . Colon polyps Paternal Aunt   . Diabetes Maternal Aunt   . Heart disease Maternal Grandfather   . Heart disease Maternal Uncle   . Heart disease Maternal Aunt   . Seizures Neg Hx     Social History   Socioeconomic History  . Marital status: Single    Spouse name: Not on file  . Number of children: 0  . Years of education: 10  . Highest education level: Not on file  Occupational History  . Not on file  Social Needs  . Financial resource strain: Not on file  . Food insecurity:    Worry: Not on file    Inability: Not on file  . Transportation needs:    Medical: Not on file    Non-medical: Not on file  Tobacco Use  . Smoking status: Never Smoker  . Smokeless tobacco: Never Used  Substance and Sexual Activity  . Alcohol use: No    Alcohol/week: 0.0 oz  . Drug use: No  . Sexual activity: Never  Lifestyle  . Physical activity:    Days per week: Not on file    Minutes per session: Not on file  . Stress: Not on file  Relationships  . Social connections:    Talks on phone: Not on file    Gets together: Not on file    Attends religious service: Not on file    Active member of club or organization: Not on file    Attends meetings of clubs or organizations: Not on file    Relationship status: Not on file  . Intimate partner violence:    Fear of current or ex partner: Not on file    Emotionally abused: Not on file    Physically abused: Not on file    Forced sexual activity: Not on  file  Other Topics Concern  . Not on file  Social History Narrative   Patient lives at home with mom.    Patient does not have any children.    Patient has a 10th grade education.    Patient is single.    Patient is left handed.    Does not have a living will or HPOA- full code.   The PMH, PSH, Social History, Family History, Medications, and allergies have been reviewed in [facility], and have been updated  if relevant.   Review of Systems  Gastrointestinal: Positive for abdominal pain. Negative for abdominal distention, anal bleeding, blood in stool, constipation, diarrhea, nausea, rectal pain and vomiting.  Skin: Positive for rash.  All other systems reviewed and are negative.      Objective:    BP 134/88 (BP Location: Left Arm, Patient Position: Sitting, Cuff Size: Large)   Pulse 80   Temp 98.6 F (37 C) (Oral)   Ht 5\' 6"  (1.676 m)   Wt 299 lb 12.8 oz (136 kg)   LMP 12/19/2014 (Exact Date)   SpO2 98%   BMI 48.39 kg/m    Physical Exam  Constitutional: She is oriented to person, place, and time. She appears well-developed and well-nourished. No distress.  HENT:  Head: Normocephalic and atraumatic.  Eyes: EOM are normal.  Neck: Normal range of motion.  Cardiovascular: Normal rate.  Pulmonary/Chest: Effort normal.  Abdominal: A hernia is present. Hernia confirmed positive in the ventral area.  Musculoskeletal: She exhibits no edema.  Neurological: She is alert and oriented to person, place, and time.  Skin: Skin is warm and dry. Rash noted. She is not diaphoretic.  Right upper quadrant- dermatomal like distribution, stops at midline- no pustules but does appear to be raised and clustered  Psychiatric: She has a normal mood and affect. Her behavior is normal. Judgment and thought content normal.  Nursing note and vitals reviewed.         Assessment & Plan:   Rash and nonspecific skin eruption  Ventral hernia without obstruction or gangrene - Plan: Ambulatory  referral to General Surgery No follow-ups on file.

## 2017-09-16 ENCOUNTER — Other Ambulatory Visit: Payer: Self-pay | Admitting: Family Medicine

## 2017-09-17 ENCOUNTER — Ambulatory Visit: Payer: Self-pay | Admitting: Surgery

## 2017-09-17 ENCOUNTER — Ambulatory Visit: Payer: Medicare Other | Admitting: Psychology

## 2017-09-17 DIAGNOSIS — K439 Ventral hernia without obstruction or gangrene: Secondary | ICD-10-CM | POA: Diagnosis not present

## 2017-09-17 NOTE — H&P (Signed)
History of Present Illness Nancy Hall. Samari Gorby MD; 09/17/2017 5:44 PM) The patient is a 47 year old female who presents with an abdominal wall hernia. Referred by Dr. Arnette Norris for ventral hernia  This is a 47 year old female with bipolar disorder, seizure disorder, sleep apnea, and learning disability who lives with her mother. She presents with a one-week history of a palpable mass in the midline above her umbilicus. This has caused some discomfort. The patient also had a tender rash over this area and a linear fashion. She was treated for shingles with Valtrex and those lesions have resolved. She was examined and was felt to have a small primary ventral hernia in the epigastrium. She is referred for surgical evaluation. The patient denies any obstructive symptoms.   Past Surgical History (Tanisha A. Owens Shark, [place]; 09/17/2017 1:42 PM) Colon Polyp Removal - Colonoscopy Hemorrhoidectomy Hysterectomy (due to cancer) - Complete Hysterectomy (due to cancer) - Partial  Diagnostic Studies History (Tanisha A. Owens Shark, [place]; 09/17/2017 1:42 PM) Colonoscopy 5-10 years ago  Allergies (Tanisha A. Owens Shark, [place]; 09/17/2017 1:43 PM) Penicillins Allergies Reconciled  Medication History (Tanisha A. Owens Shark, [place]; 09/17/2017 1:45 PM) ARIPiprazole (15MG  Tablet, Oral) Active. Gabapentin (300MG  Capsule, Oral) Active. LamoTRIgine (150MG  Tablet, Oral) Active. Levothyroxine Sodium (137MCG Tablet, Oral) Active. Sertraline HCl (100MG  Tablet, Oral) Active. Topiramate (100MG  Tablet, Oral) Active. ValACYclovir HCl (1GM Tablet, Oral) Active. Clotrimazole (1% Cream, External) Active. Hydrocortisone (0.5% Cream, External) Active. Medications Reconciled  Social History (Tanisha A. Owens Shark, [place]; 09/17/2017 1:42 PM) No alcohol use No drug use Tobacco use Never smoker.  Family History (Tanisha A. Owens Shark, [place]; 09/17/2017 1:42 PM) Arthritis Father, Mother. Cerebrovascular Accident Brother, Family Members In  [place], Father. Colon Cancer Family Members In [place], Father. Depression Mother, Sister. Diabetes Mellitus Brother, Family Members In [place], Sister. Heart Disease Father. Hypertension Brother, Father. Migraine Headache Mother. Prostate Cancer Father.  Pregnancy / Birth History (Tanisha A. Owens Shark, [place]; 09/17/2017 1:42 PM) Age at menarche 57 years. Age of menopause <47 Gravida 0 Para 0  Other Problems (Tanisha A. Owens Shark, RMA; 09/17/2017 1:42 PM) Anxiety Disorder Back Pain Depression Gastroesophageal Reflux Disease Inguinal Hernia Oophorectomy Left. Seizure Disorder Thyroid Disease     Review of Systems (Tanisha A. Brown RMA; 09/17/2017 1:42 PM) General Present- Weight Gain. Not Present- Appetite Loss, Chills, Fatigue, Fever, Night Sweats and Weight Loss. Skin Present- Rash. Not Present- Change in Wart/Mole, Dryness, Hives, Jaundice, New Lesions, Non-Healing Wounds and Ulcer. HEENT Present- Hearing Loss, Seasonal Allergies and Wears glasses/contact lenses. Not Present- Earache, Hoarseness, Nose Bleed, Oral Ulcers, Ringing in the Ears, Sinus Pain, Sore Throat, Visual Disturbances and Yellow Eyes. Respiratory Present- Snoring. Not Present- Bloody sputum, Chronic Cough, Difficulty Breathing and Wheezing. Cardiovascular Present- Difficulty Breathing Lying Down and Swelling of Extremities. Not Present- Chest Pain, Leg Cramps, Palpitations, Rapid Heart Rate and Shortness of Breath. Gastrointestinal Present- Indigestion. Not Present- Abdominal Pain, Bloating, Bloody Stool, Change in Bowel Habits, Chronic diarrhea, Constipation, Difficulty Swallowing, Excessive gas, Gets full quickly at meals, Hemorrhoids, Nausea, Rectal Pain and Vomiting. Female Genitourinary Present- Frequency. Not Present- Nocturia, Painful Urination, Pelvic Pain and Urgency. Musculoskeletal Present- Back Pain. Not Present- Joint Pain, Joint Stiffness, Muscle Pain, Muscle Weakness and Swelling of  Extremities. Neurological Present- Seizures. Not Present- Decreased Memory, Fainting, Headaches, Numbness, Tingling, Tremor, Trouble walking and Weakness. Psychiatric Present- Anxiety, Bipolar and Depression. Not Present- Change in Sleep Pattern, Fearful and Frequent crying. Endocrine Present- Hot flashes. Not Present- Cold Intolerance, Excessive Hunger, Hair Changes, Heat Intolerance and New Diabetes.  Vitals (  Tanisha A. Brown RMA; 09/17/2017 1:43 PM) 09/17/2017 1:42 PM Weight: 296.6 lb Height: 66in Body Surface Area: 2.36 m Body Mass Index: 47.87 kg/m  Temp.: 97.36F  Pulse: 107 (Regular)  BP: 138/72 (Sitting, Left Arm, Standard)      Physical Exam Rodman Key K. Neamiah Sciarra MD; 09/17/2017 5:46 PM)  The physical exam findings are as follows: Note:WDWN in NAD, mentation seems slightly slow, but patient is alert and cooperative Eyes: Pupils equal, round; sclera anicteric HENT: Oral mucosa moist; good dentition Neck: No masses palpated, no thyromegaly Lungs: CTA bilaterally; normal respiratory effort CV: Regular rate and rhythm; no murmurs; extremities well-perfused with no edema Abd: +bowel sounds, obese, soft, non-tender, no palpable organomegaly; visible palpable mass about 4 cm above umbilicus When she is supine, this mass is reducible Skin: Warm, dry; no sign of jaundice Psychiatric - alert; calm mood and affect    Assessment & Plan Rodman Key K. Sanaia Jasso MD; 09/17/2017 5:45 PM)  EPIGASTRIC HERNIA (K43.9)  Current Plans Schedule for Surgery - Open repair of epigastric ventral hernia with mesh. The surgical procedure has been discussed with the patient. Potential risks, benefits, alternative treatments, and expected outcomes have been explained. All of the patient's questions at this time have been answered. The likelihood of reaching the patient's treatment goal is good. The patient understand the proposed surgical procedure and wishes to proceed.  Nancy Hall. Georgette Dover, MD,  [facility] Surgery  General/ Trauma Surgery  09/17/2017 5:47 PM

## 2017-09-22 ENCOUNTER — Ambulatory Visit (INDEPENDENT_AMBULATORY_CARE_PROVIDER_SITE_OTHER): Payer: Medicare Other | Admitting: Psychology

## 2017-09-22 DIAGNOSIS — F3132 Bipolar disorder, current episode depressed, moderate: Secondary | ICD-10-CM | POA: Diagnosis not present

## 2017-09-24 ENCOUNTER — Ambulatory Visit: Payer: Medicare Other | Admitting: Nurse Practitioner

## 2017-09-24 DIAGNOSIS — H903 Sensorineural hearing loss, bilateral: Secondary | ICD-10-CM | POA: Diagnosis not present

## 2017-09-24 DIAGNOSIS — H6983 Other specified disorders of Eustachian tube, bilateral: Secondary | ICD-10-CM | POA: Diagnosis not present

## 2017-10-10 NOTE — Pre-Procedure Instructions (Signed)
Minyon Billiter Lofquist  10/10/2017      [id number] [address] [place], [place] [address] Phone: [phone] Fax: [phone]    Your procedure is scheduled on July 23  Report to [place] at [place].M.  Call this number if you have problems the morning of surgery:  [phone]   Remember:  Do not eat after midnight.  You may drink clear liquids until 0430 .  Clear liquids allowed are:   Water and Gatorade    Take these medicines the morning of surgery with A SIP OF WATER  ARIPiprazole (ABILIFY) gabapentin (NEURONTIN)  lamoTRIgine (LAMICTAL)\ levothyroxine (SYNTHROID, LEVOTHROID)  omeprazole (PRILOSEC) sertraline (ZOLOFT) valACYclovir (VALTREX)  7 days prior to surgery STOP taking any Aspirin(unless otherwise instructed by your surgeon), Aleve, Naproxen, Ibuprofen, Motrin, Advil, Goody's, BC's, all herbal medications, fish oil, and all vitamins     Do not wear jewelry, make-up or nail polish.  Do not wear lotions, powders, or perfumes, or deodorant.  Do not shave 48 hours prior to surgery.   Do not bring valuables to the hospital.  [facility] is not responsible for any belongings or valuables.  Contacts, dentures or bridgework may not be worn into surgery.  Leave your suitcase in the car.  After surgery it may be brought to your room.  For patients admitted to the hospital, discharge time will be determined by your treatment team.  Patients discharged the day of surgery will not be allowed to drive home.    Special instructions:   [facility]- Preparing For Surgery  Before surgery, you can play an important role. Because skin is not sterile, your skin needs to be as free of germs as possible. You can reduce the number of germs on your skin by washing with CHG (chlorahexidine gluconate) Soap before surgery.  CHG is an antiseptic cleaner which kills germs and bonds with the skin to continue  killing germs even after washing.    Oral Hygiene is also important to reduce your risk of infection.  Remember - BRUSH YOUR TEETH THE MORNING OF SURGERY WITH YOUR REGULAR TOOTHPASTE  Please do not use if you have an allergy to CHG or antibacterial soaps. If your skin becomes reddened/irritated stop using the CHG.  Do not shave (including legs and underarms) for at least 48 hours prior to first CHG shower. It is OK to shave your face.  Please follow these instructions carefully.   1. Shower the NIGHT BEFORE SURGERY and the MORNING OF SURGERY with CHG.   2. If you chose to wash your hair, wash your hair first as usual with your normal shampoo.  3. After you shampoo, rinse your hair and body thoroughly to remove the shampoo.  4. Use CHG as you would any other liquid soap. You can apply CHG directly to the skin and wash gently with a scrungie or a clean washcloth.   5. Apply the CHG Soap to your body ONLY FROM THE NECK DOWN.  Do not use on open wounds or open sores. Avoid contact with your eyes, ears, mouth and genitals (private parts). Wash Face and genitals (private parts)  with your normal soap.  6. Wash thoroughly, paying special attention to the area where your surgery will be performed.  7. Thoroughly rinse your body with warm water from the neck down.  8. DO NOT shower/wash with your normal soap after using and rinsing off the CHG  Soap.  9. Pat yourself dry with a CLEAN TOWEL.  10. Wear CLEAN PAJAMAS to bed the night before surgery, wear comfortable clothes the morning of surgery  11. Place CLEAN SHEETS on your bed the night of your first shower and DO NOT SLEEP WITH PETS.    Day of Surgery:  Do not apply any deodorants/lotions.  Please wear clean clothes to the hospital/surgery center.   Remember to brush your teeth WITH YOUR REGULAR TOOTHPASTE.    Please read over the following fact sheets that you were given.

## 2017-10-13 ENCOUNTER — Other Ambulatory Visit: Payer: Self-pay

## 2017-10-13 ENCOUNTER — Encounter (HOSPITAL_COMMUNITY): Payer: Self-pay

## 2017-10-13 ENCOUNTER — Encounter (HOSPITAL_COMMUNITY)
Admission: RE | Admit: 2017-10-13 | Discharge: 2017-10-13 | Disposition: A | Discharge status: Home / Self Care | Payer: Medicare Other | Source: Ambulatory Visit | Attending: Surgery | Admitting: Surgery

## 2017-10-13 DIAGNOSIS — Z01812 Encounter for preprocedural laboratory examination: Secondary | ICD-10-CM | POA: Insufficient documentation

## 2017-10-13 HISTORY — DX: Other specified postprocedural states: Z98.890

## 2017-10-13 HISTORY — DX: Adverse effect of unspecified anesthetic, initial encounter: T41.45XA

## 2017-10-13 HISTORY — DX: Other specified postprocedural states: R11.2

## 2017-10-13 HISTORY — DX: Malignant (primary) neoplasm, unspecified: C80.1

## 2017-10-13 HISTORY — DX: Other complications of anesthesia, initial encounter: T88.59XA

## 2017-10-13 HISTORY — DX: Ventral hernia without obstruction or gangrene: K43.9

## 2017-10-13 HISTORY — DX: Unspecified hearing loss, unspecified ear: H91.90

## 2017-10-13 LAB — BASIC METABOLIC PANEL
Anion gap: 9 (ref 5–15)
BUN: 10 mg/dL (ref 6–20)
CALCIUM: 8.5 mg/dL — AB (ref 8.9–10.3)
CO2: 20 mmol/L — AB (ref 22–32)
CREATININE: 0.98 mg/dL (ref 0.44–1.00)
Chloride: 110 mmol/L (ref 98–111)
GFR calc non Af Amer: >60 mL/min (ref >60)
Glucose, Bld: 133 mg/dL — ABNORMAL HIGH (ref 70–99)
Potassium: 3.8 mmol/L (ref 3.5–5.1)
SODIUM: 139 mmol/L (ref 135–145)

## 2017-10-13 LAB — CBC
HCT: 37.6 % (ref 36.0–46.0)
HEMOGLOBIN: 11.6 g/dL — AB (ref 12.0–15.0)
MCH: 29.8 pg (ref 26.0–34.0)
MCHC: 30.9 g/dL (ref 30.0–36.0)
MCV: 96.7 fL (ref 78.0–100.0)
PLATELETS: 176 10*3/uL (ref 150–400)
RBC: 3.89 MIL/uL (ref 3.87–5.11)
RDW: 14.3 % (ref 11.5–15.5)
WBC: 6 10*3/uL (ref 4.0–10.5)

## 2017-10-13 NOTE — Progress Notes (Signed)
PCP - Dr. Arnette Norris  Cardiologist - Denies  Chest x-ray - Denies  EKG - Denies  Stress Test - Denies  ECHO - Denies  Cardiac Cath - Denies  Sleep Study - Yes- Positive CPAP - None  LABS- 10/13/17: CBC, BMP  ASA- Denies   Anesthesia- No  Pt denies having chest pain, sob, or fever at this time. All instructions explained to the pt, with a verbal understanding of the material. Pt agrees to go over the instructions while at home for a better understanding. The opportunity to ask questions was provided.

## 2017-10-15 ENCOUNTER — Encounter: Payer: Self-pay | Admitting: Family Medicine

## 2017-10-16 ENCOUNTER — Ambulatory Visit (INDEPENDENT_AMBULATORY_CARE_PROVIDER_SITE_OTHER): Payer: Medicare Other | Admitting: Psychology

## 2017-10-16 ENCOUNTER — Encounter (INDEPENDENT_AMBULATORY_CARE_PROVIDER_SITE_OTHER): Payer: Medicare Other

## 2017-10-16 DIAGNOSIS — F3132 Bipolar disorder, current episode depressed, moderate: Secondary | ICD-10-CM

## 2017-10-20 ENCOUNTER — Encounter (HOSPITAL_COMMUNITY): Payer: Self-pay | Admitting: Anesthesiology

## 2017-10-20 NOTE — Anesthesia Preprocedure Evaluation (Addendum)
Anesthesia Evaluation  Patient identified by MRN, date of birth, ID band Patient awake    Reviewed: Allergy & Precautions, NPO status , Patient's Chart, lab work & pertinent test results  History of Anesthesia Complications (+) PONV and history of anesthetic complications  Airway Mallampati: III  TM Distance: >3 FB Neck ROM: Full    Dental  (+) Partial Lower, Partial Upper   Pulmonary sleep apnea ,    Pulmonary exam normal breath sounds clear to auscultation       Cardiovascular + Peripheral Vascular Disease  Normal cardiovascular exam Rhythm:Regular Rate:Normal     Neuro/Psych Seizures -, Well Controlled,  PSYCHIATRIC DISORDERS Anxiety Depression Bipolar Disorder Learning disability Mental retardationLast seizure 2 years ago ?pseudoseizures  Neuromuscular disease    GI/Hepatic Neg liver ROS, hiatal hernia, GERD  Medicated and Controlled,  Endo/Other  Hypothyroidism Morbid obesityHyperlipidemia  Renal/GU negative Renal ROS  negative genitourinary   Musculoskeletal Low back pain with left leg sciatica Ventral epigastric hernia   Abdominal (+) + obese,   Peds  Hematology  (+) anemia ,   Anesthesia Other Findings   Reproductive/Obstetrics                            Anesthesia Physical Anesthesia Plan  ASA: III  Anesthesia Plan: General   Post-op Pain Management:    Induction: Intravenous  PONV Risk Score and Plan: 4 or greater and Scopolamine patch - Pre-op, Dexamethasone, Ondansetron, Treatment may vary due to age or medical condition and Midazolam  Airway Management Planned: LMA  Additional Equipment:   Intra-op Plan:   Post-operative Plan: Extubation in OR  Informed Consent: I have reviewed the patients History and Physical, chart, labs and discussed the procedure including the risks, benefits and alternatives for the proposed anesthesia with the patient or authorized  representative who has indicated his/her understanding and acceptance.   Dental advisory given  Plan Discussed with: CRNA and Surgeon  Anesthesia Plan Comments:        Anesthesia Quick Evaluation

## 2017-10-21 ENCOUNTER — Ambulatory Visit (HOSPITAL_COMMUNITY): Payer: Medicare Other | Admitting: Anesthesiology

## 2017-10-21 ENCOUNTER — Encounter (HOSPITAL_COMMUNITY)
Admission: RE | Disposition: A | Discharge status: Home / Self Care | Payer: Self-pay | Source: Ambulatory Visit | Attending: Surgery

## 2017-10-21 ENCOUNTER — Encounter (HOSPITAL_COMMUNITY): Payer: Self-pay | Admitting: Urology

## 2017-10-21 ENCOUNTER — Ambulatory Visit (HOSPITAL_COMMUNITY)
Admission: RE | Admit: 2017-10-21 | Discharge: 2017-10-21 | Disposition: A | Discharge status: Home / Self Care | Payer: Medicare Other | Source: Ambulatory Visit | Attending: Surgery | Admitting: Surgery

## 2017-10-21 DIAGNOSIS — Z79899 Other long term (current) drug therapy: Secondary | ICD-10-CM | POA: Insufficient documentation

## 2017-10-21 DIAGNOSIS — E785 Hyperlipidemia, unspecified: Secondary | ICD-10-CM | POA: Insufficient documentation

## 2017-10-21 DIAGNOSIS — E039 Hypothyroidism, unspecified: Secondary | ICD-10-CM | POA: Insufficient documentation

## 2017-10-21 DIAGNOSIS — K654 Sclerosing mesenteritis: Secondary | ICD-10-CM | POA: Diagnosis not present

## 2017-10-21 DIAGNOSIS — F418 Other specified anxiety disorders: Secondary | ICD-10-CM | POA: Insufficient documentation

## 2017-10-21 DIAGNOSIS — K219 Gastro-esophageal reflux disease without esophagitis: Secondary | ICD-10-CM | POA: Diagnosis not present

## 2017-10-21 DIAGNOSIS — G4730 Sleep apnea, unspecified: Secondary | ICD-10-CM | POA: Insufficient documentation

## 2017-10-21 DIAGNOSIS — I739 Peripheral vascular disease, unspecified: Secondary | ICD-10-CM | POA: Diagnosis not present

## 2017-10-21 DIAGNOSIS — K439 Ventral hernia without obstruction or gangrene: Secondary | ICD-10-CM | POA: Insufficient documentation

## 2017-10-21 HISTORY — PX: INSERTION OF MESH: SHX5868

## 2017-10-21 HISTORY — PX: VENTRAL HERNIA REPAIR: SHX424

## 2017-10-21 SURGERY — REPAIR, HERNIA, VENTRAL
Anesthesia: General | Site: Abdomen

## 2017-10-21 MED ORDER — BUPIVACAINE-EPINEPHRINE (PF) 0.25% -1:200000 IJ SOLN
INTRAMUSCULAR | Status: AC
  Filled 2017-10-21: qty 30

## 2017-10-21 MED ORDER — ROCURONIUM BROMIDE 100 MG/10ML IV SOLN
INTRAVENOUS | Status: DC | PRN
  Administered 2017-10-21: 40 mg via INTRAVENOUS

## 2017-10-21 MED ORDER — 0.9 % SODIUM CHLORIDE (POUR BTL) OPTIME
TOPICAL | Status: DC | PRN
  Administered 2017-10-21: 1000 mL

## 2017-10-21 MED ORDER — SODIUM CHLORIDE 0.9 % IV SOLN
INTRAVENOUS | Status: DC | PRN
  Administered 2017-10-21: 25 ug/min via INTRAVENOUS
  Administered 2017-10-21: 35 ug/min via INTRAVENOUS

## 2017-10-21 MED ORDER — CHLORHEXIDINE GLUCONATE CLOTH 2 % EX PADS: 6.0000 | MEDICATED_PAD | Freq: Once | CUTANEOUS | Status: DC

## 2017-10-21 MED ORDER — ONDANSETRON HCL 4 MG/2ML IJ SOLN
INTRAMUSCULAR | Status: DC | PRN
  Administered 2017-10-21: 4 mg via INTRAVENOUS

## 2017-10-21 MED ORDER — OXYCODONE HCL 5 MG PO TABS: 5.0000 mg | ORAL_TABLET | Freq: Four times a day (QID) | ORAL | 0 refills | Status: DC | PRN

## 2017-10-21 MED ORDER — HYDROMORPHONE HCL 1 MG/ML IJ SOLN
0.2500 mg | INTRAMUSCULAR | Status: DC | PRN
  Administered 2017-10-21: 0.25 mg via INTRAVENOUS

## 2017-10-21 MED ORDER — FENTANYL CITRATE (PF) 250 MCG/5ML IJ SOLN
INTRAMUSCULAR | Status: AC
  Filled 2017-10-21: qty 5

## 2017-10-21 MED ORDER — EPHEDRINE SULFATE 50 MG/ML IJ SOLN
INTRAMUSCULAR | Status: DC | PRN
  Administered 2017-10-21 (×2): 5 mg via INTRAVENOUS

## 2017-10-21 MED ORDER — LIDOCAINE HCL (CARDIAC) PF 100 MG/5ML IV SOSY
PREFILLED_SYRINGE | INTRAVENOUS | Status: DC | PRN
  Administered 2017-10-21: 40 mg via INTRAVENOUS

## 2017-10-21 MED ORDER — SUGAMMADEX SODIUM 500 MG/5ML IV SOLN
INTRAVENOUS | Status: DC | PRN
  Administered 2017-10-21: 280 mg via INTRAVENOUS

## 2017-10-21 MED ORDER — CELECOXIB 200 MG PO CAPS
200.0000 mg | ORAL_CAPSULE | ORAL | Status: AC
  Administered 2017-10-21: 200 mg via ORAL
  Filled 2017-10-21: qty 1

## 2017-10-21 MED ORDER — PROPOFOL 10 MG/ML IV BOLUS
INTRAVENOUS | Status: DC | PRN
  Administered 2017-10-21: 130 mg via INTRAVENOUS

## 2017-10-21 MED ORDER — ONDANSETRON HCL 4 MG/2ML IJ SOLN: 4.0000 mg | Freq: Once | INTRAMUSCULAR | Status: DC | PRN

## 2017-10-21 MED ORDER — DEXTROSE 5 % IV SOLN
3.0000 g | INTRAVENOUS | Status: AC
  Administered 2017-10-21: 3 g via INTRAVENOUS
  Filled 2017-10-21 (×2): qty 3000

## 2017-10-21 MED ORDER — MIDAZOLAM HCL 5 MG/5ML IJ SOLN
INTRAMUSCULAR | Status: DC | PRN
  Administered 2017-10-21: 2 mg via INTRAVENOUS

## 2017-10-21 MED ORDER — LACTATED RINGERS IV SOLN
INTRAVENOUS | Status: DC | PRN
  Administered 2017-10-21: 07:00:00 via INTRAVENOUS

## 2017-10-21 MED ORDER — SUCCINYLCHOLINE CHLORIDE 20 MG/ML IJ SOLN
INTRAMUSCULAR | Status: DC | PRN
  Administered 2017-10-21: 120 mg via INTRAVENOUS

## 2017-10-21 MED ORDER — DEXAMETHASONE SODIUM PHOSPHATE 10 MG/ML IJ SOLN
INTRAMUSCULAR | Status: DC | PRN
  Administered 2017-10-21: 10 mg via INTRAVENOUS

## 2017-10-21 MED ORDER — BUPIVACAINE-EPINEPHRINE 0.25% -1:200000 IJ SOLN
INTRAMUSCULAR | Status: DC | PRN
Start: 2017-10-21 — End: 2017-10-21
  Administered 2017-10-21: 20 mL

## 2017-10-21 MED ORDER — GABAPENTIN 300 MG PO CAPS
300.0000 mg | ORAL_CAPSULE | ORAL | Status: AC
  Administered 2017-10-21: 300 mg via ORAL
  Filled 2017-10-21: qty 1

## 2017-10-21 MED ORDER — HYDROCODONE-ACETAMINOPHEN 7.5-325 MG PO TABS: 1.0000 | ORAL_TABLET | Freq: Once | ORAL | Status: DC | PRN

## 2017-10-21 MED ORDER — ACETAMINOPHEN 500 MG PO TABS
1000.0000 mg | ORAL_TABLET | ORAL | Status: AC
  Administered 2017-10-21: 1000 mg via ORAL
  Filled 2017-10-21: qty 2

## 2017-10-21 MED ORDER — HYDROMORPHONE HCL 1 MG/ML IJ SOLN
INTRAMUSCULAR | Status: AC
  Filled 2017-10-21: qty 1

## 2017-10-21 MED ORDER — SCOPOLAMINE 1 MG/3DAYS TD PT72
MEDICATED_PATCH | TRANSDERMAL | Status: DC | PRN
  Administered 2017-10-21: 1 via TRANSDERMAL

## 2017-10-21 MED ORDER — MIDAZOLAM HCL 2 MG/2ML IJ SOLN
INTRAMUSCULAR | Status: AC
  Filled 2017-10-21: qty 2

## 2017-10-21 MED ORDER — MEPERIDINE HCL 50 MG/ML IJ SOLN: 6.2500 mg | INTRAMUSCULAR | Status: DC | PRN

## 2017-10-21 MED ORDER — PROPOFOL 10 MG/ML IV BOLUS
INTRAVENOUS | Status: AC
  Filled 2017-10-21: qty 20

## 2017-10-21 MED ORDER — FENTANYL CITRATE (PF) 100 MCG/2ML IJ SOLN
INTRAMUSCULAR | Status: DC | PRN
  Administered 2017-10-21: 50 ug via INTRAVENOUS
  Administered 2017-10-21: 150 ug via INTRAVENOUS
  Administered 2017-10-21 (×2): 25 ug via INTRAVENOUS

## 2017-10-21 SURGICAL SUPPLY — 40 items
BENZOIN TINCTURE PRP APPL 2/3 (GAUZE/BANDAGES/DRESSINGS) ×3 IMPLANT
BINDER ABDOMINAL 12 ML 46-62 (SOFTGOODS) ×3 IMPLANT
BLADE CLIPPER SURG (BLADE) IMPLANT
CANISTER SUCT 3000ML PPV (MISCELLANEOUS) ×3 IMPLANT
CHLORAPREP W/TINT 26ML (MISCELLANEOUS) ×3 IMPLANT
CLOSURE WOUND 1/2 X4 (GAUZE/BANDAGES/DRESSINGS) ×1
COVER SURGICAL LIGHT HANDLE (MISCELLANEOUS) ×3 IMPLANT
DRAPE LAPAROSCOPIC ABDOMINAL (DRAPES) ×3 IMPLANT
DRSG TEGADERM 4X4.75 (GAUZE/BANDAGES/DRESSINGS) ×3 IMPLANT
ELECT BLADE 4.0 EZ CLEAN MEGAD (MISCELLANEOUS) ×3
ELECT CAUTERY BLADE 6.4 (BLADE) ×3 IMPLANT
ELECT REM PT RETURN 9FT ADLT (ELECTROSURGICAL) ×3
ELECTRODE BLDE 4.0 EZ CLN MEGD (MISCELLANEOUS) ×1 IMPLANT
ELECTRODE REM PT RTRN 9FT ADLT (ELECTROSURGICAL) ×1 IMPLANT
GAUZE SPONGE 4X4 12PLY STRL (GAUZE/BANDAGES/DRESSINGS) ×3 IMPLANT
GLOVE BIO SURGEON STRL SZ7 (GLOVE) ×3 IMPLANT
GLOVE BIOGEL PI IND STRL 7.5 (GLOVE) ×1 IMPLANT
GLOVE BIOGEL PI INDICATOR 7.5 (GLOVE) ×2
GOWN STRL REUS W/ TWL LRG LVL3 (GOWN DISPOSABLE) ×2 IMPLANT
GOWN STRL REUS W/TWL LRG LVL3 (GOWN DISPOSABLE) ×4
KIT BASIN OR (CUSTOM PROCEDURE TRAY) ×3 IMPLANT
KIT TURNOVER KIT B (KITS) ×3 IMPLANT
MESH VENT ST 11.4CM L CIR (Mesh General) ×3 IMPLANT
NEEDLE HYPO 25GX1X1/2 BEV (NEEDLE) ×3 IMPLANT
NS IRRIG 1000ML POUR BTL (IV SOLUTION) ×3 IMPLANT
PACK GENERAL/GYN (CUSTOM PROCEDURE TRAY) ×3 IMPLANT
PAD ARMBOARD 7.5X6 YLW CONV (MISCELLANEOUS) ×6 IMPLANT
STRIP CLOSURE SKIN 1/2X4 (GAUZE/BANDAGES/DRESSINGS) ×2 IMPLANT
SUT MNCRL AB 4-0 PS2 18 (SUTURE) ×3 IMPLANT
SUT NOVA NAB GS-21 0 18 T12 DT (SUTURE) ×9 IMPLANT
SUT NOVA NAB GS-21 1 T12 (SUTURE) ×3 IMPLANT
SUT SILK 2 0 (SUTURE) ×2
SUT SILK 2-0 18XBRD TIE 12 (SUTURE) ×1 IMPLANT
SUT VIC AB 3-0 SH 18 (SUTURE) ×3 IMPLANT
SUT VIC AB 3-0 SH 27 (SUTURE) ×2
SUT VIC AB 3-0 SH 27XBRD (SUTURE) ×1 IMPLANT
SYR CONTROL 10ML LL (SYRINGE) ×3 IMPLANT
TOWEL OR 17X24 6PK STRL BLUE (TOWEL DISPOSABLE) ×3 IMPLANT
TOWEL OR 17X26 10 PK STRL BLUE (TOWEL DISPOSABLE) ×3 IMPLANT
TRAY FOLEY MTR SLVR 14FR STAT (SET/KITS/TRAYS/PACK) IMPLANT

## 2017-10-21 NOTE — Discharge Instructions (Signed)
[facility]   HERNIA REPAIR: POST OP INSTRUCTIONS  Always review your discharge instruction sheet given to you by the facility where your surgery was performed. IF YOU HAVE DISABILITY OR FAMILY LEAVE FORMS, YOU MUST BRING THEM TO THE OFFICE FOR PROCESSING.   DO NOT GIVE THEM TO YOUR DOCTOR.  1. A  prescription for pain medication may be given to you upon discharge.  Take your pain medication as prescribed, if needed.  If narcotic pain medicine is not needed, then you may take acetaminophen (Tylenol) or ibuprofen (Advil) as needed. 2. Take your usually prescribed medications unless otherwise directed. If you need a refill on your pain medication, please contact your pharmacy.  They will contact our office to request authorization. Prescriptions will not be filled after 5 pm or on week-ends. 3. You should follow a light diet the first 24 hours after arrival home, such as soup and crackers, etc.  Be sure to include lots of fluids daily.  Resume your normal diet the day after surgery. 4.Most patients will experience some swelling and bruising around the incision. Ice packs and reclining will help.  Swelling and bruising can take several days to resolve.  6. It is common to experience some constipation if taking pain medication after surgery.  Increasing fluid intake and taking a stool softener (such as Colace) will usually help or prevent this problem from occurring.  A mild laxative (Milk of Magnesia or Miralax) should be taken according to package directions if there are no bowel movements after 48 hours. 7. Unless discharge instructions indicate otherwise, you may remove your bandages 48 hours after surgery, and you may shower at that time.  You may have steri-strips (small skin tapes) in place directly over the incision.  These strips should be left on the skin for 7-10 days.   8. ACTIVITIES:  You may resume regular (light) daily activities beginning the next day--such as  daily self-care, walking, climbing stairs--gradually increasing activities as tolerated.  Refrain from any heavy lifting or straining until approved by your doctor.  a.You may drive when you are no longer taking prescription pain medication, you can comfortably wear a seatbelt, and you can safely maneuver your car and apply brakes. b.RETURN TO WORK:   _____________________________________________  9.You should see your doctor in the office for a follow-up appointment approximately 2-3 weeks after your surgery.  Make sure that you call for this appointment within a day or two after you arrive home to insure a convenient appointment time. 10.OTHER INSTRUCTIONS: _________________________    _____________________________________  WHEN TO CALL YOUR DOCTOR: 1. Fever over 101.0 2. Inability to urinate 3. Nausea and/or vomiting 4. Extreme swelling or bruising 5. Continued bleeding from incision. 6. Increased pain, redness, or drainage from the incision  The clinic staff is available to answer your questions during regular business hours.  Please dont hesitate to call and ask to speak to one of the nurses for clinical concerns.  If you have a medical emergency, go to the nearest emergency room or call 911.  A surgeon from [facility] is always on call at the hospital   [address], [place], [place], [place]  [address] ?  [address], [place], [place]   [address] [phone] ? [phone] ? FAX [phone] Web site: [email or web address]

## 2017-10-21 NOTE — Anesthesia Procedure Notes (Signed)
Procedure Name: Intubation Date/Time: 10/21/2017 7:36 AM Performed by: Izora Gala, CRNA Pre-anesthesia Checklist: Patient identified, Emergency Drugs available, Suction available and Patient being monitored Patient Re-evaluated:Patient Re-evaluated prior to induction Oxygen Delivery Method: Circle System Utilized Preoxygenation: Pre-oxygenation with 100% oxygen Induction Type: IV induction, Rapid sequence and Cricoid Pressure applied Laryngoscope Size: Miller and 3 Grade View: Grade I Tube type: Oral Tube size: 7.0 mm Number of attempts: 1 Airway Equipment and Method: Stylet and Oral airway Placement Confirmation: ETT inserted through vocal cords under direct vision,  positive ETCO2 and breath sounds checked- equal and bilateral Secured at: 21 cm Tube secured with: Tape Dental Injury: Teeth and Oropharynx as per pre-operative assessment

## 2017-10-21 NOTE — Transfer of Care (Signed)
Immediate Anesthesia Transfer of Care Note  Patient: Nancy Hall  Procedure(s) Performed: OPEN REPAIR EPIGASTIC VENTRAL HERNIA WITH  MESH ERAS PATHWAY (N/A Abdomen) INSERTION OF MESH (N/A Abdomen)  Patient Location: PACU  Anesthesia Type:General  Level of Consciousness: awake, alert , oriented, drowsy and patient cooperative  Airway & Oxygen Therapy: Patient Spontanous Breathing and Patient connected to face mask oxygen  Post-op Assessment: Report given to RN, Post -op Vital signs reviewed and stable, Patient moving all extremities and Patient moving all extremities X 4  Post vital signs: Reviewed and stable  Last Vitals:  Vitals Value Taken Time  BP 122/81 10/21/2017  9:30 AM  Temp    Pulse 86 10/21/2017  9:32 AM  Resp 17 10/21/2017  9:32 AM  SpO2 94 % 10/21/2017  9:32 AM  Vitals shown include unvalidated device data.  Last Pain:  Vitals:   10/21/17 0624  PainSc: 0-No pain         Complications: No apparent anesthesia complications

## 2017-10-21 NOTE — Anesthesia Postprocedure Evaluation (Signed)
Anesthesia Post Note  Patient: Nancy Hall  Procedure(s) Performed: OPEN REPAIR EPIGASTIC VENTRAL HERNIA WITH  MESH ERAS PATHWAY (N/A Abdomen) INSERTION OF MESH (N/A Abdomen)     Patient location during evaluation: PACU Anesthesia Type: General Level of consciousness: awake and alert Pain management: pain level controlled Vital Signs Assessment: post-procedure vital signs reviewed and stable Respiratory status: spontaneous breathing, nonlabored ventilation and respiratory function stable Cardiovascular status: blood pressure returned to baseline and stable Postop Assessment: no apparent nausea or vomiting Anesthetic complications: no    Last Vitals:  Vitals:   10/21/17 0945 10/21/17 1000  BP: 108/74 105/68  Pulse: 79 72  Resp: 16 14  Temp:  (!) 36.4 C  SpO2: 93% 93%    Last Pain:  Vitals:   10/21/17 1000  PainSc: 3                  Dontasia Miranda A.

## 2017-10-21 NOTE — H&P (Signed)
History of Present Illness  The patient is a 47 year old female who presents with an abdominal wall hernia. Referred by Dr. Arnette Norris for ventral hernia  This is a 47 year old female with bipolar disorder, seizure disorder, sleep apnea, and learning disability who lives with her mother. She presents with a one-week history of a palpable mass in the midline above her umbilicus. This has caused some discomfort. The patient also had a tender rash over this area and a linear fashion. She was treated for shingles with Valtrex and those lesions have resolved. She was examined and was felt to have a small primary ventral hernia in the epigastrium. She is referred for surgical evaluation. The patient denies any obstructive symptoms.   Past Surgical History Colon Polyp Removal - Colonoscopy Hemorrhoidectomy Hysterectomy (due to cancer) - Complete Hysterectomy (due to cancer) - Partial  Diagnostic Studies History  Colonoscopy 5-10 years ago  Allergies  Penicillins Allergies Reconciled  Medication History ARIPiprazole (15MG  Tablet, Oral) Active. Gabapentin (300MG  Capsule, Oral) Active. LamoTRIgine (150MG  Tablet, Oral) Active. Levothyroxine Sodium (137MCG Tablet, Oral) Active. Sertraline HCl (100MG  Tablet, Oral) Active. Topiramate (100MG  Tablet, Oral) Active. ValACYclovir HCl (1GM Tablet, Oral) Active. Clotrimazole (1% Cream, External) Active. Hydrocortisone (0.5% Cream, External) Active. Medications Reconciled  Social History No alcohol use No drug use Tobacco use Never smoker.  Family History Arthritis Father, Mother. Cerebrovascular Accident Brother, Family Members In [place], Father. Colon Cancer Family Members In [place], Father. Depression Mother, Sister. Diabetes Mellitus Brother, Family Members In [place], Sister. Heart Disease Father. Hypertension Brother, Father. Migraine Headache Mother. Prostate Cancer Father.  Pregnancy /  Birth History  Age at menarche 21 years. Age of menopause <47 Gravida 0 Para 0  Other Problems  Anxiety Disorder Back Pain Depression Gastroesophageal Reflux Disease Inguinal Hernia Oophorectomy Left. Seizure Disorder Thyroid Disease     Review of Systems General Present- Weight Gain. Not Present- Appetite Loss, Chills, Fatigue, Fever, Night Sweats and Weight Loss. Skin Present- Rash. Not Present- Change in Wart/Mole, Dryness, Hives, Jaundice, New Lesions, Non-Healing Wounds and Ulcer. HEENT Present- Hearing Loss, Seasonal Allergies and Wears glasses/contact lenses. Not Present- Earache, Hoarseness, Nose Bleed, Oral Ulcers, Ringing in the Ears, Sinus Pain, Sore Throat, Visual Disturbances and Yellow Eyes. Respiratory Present- Snoring. Not Present- Bloody sputum, Chronic Cough, Difficulty Breathing and Wheezing. Cardiovascular Present- Difficulty Breathing Lying Down and Swelling of Extremities. Not Present- Chest Pain, Leg Cramps, Palpitations, Rapid Heart Rate and Shortness of Breath. Gastrointestinal Present- Indigestion. Not Present- Abdominal Pain, Bloating, Bloody Stool, Change in Bowel Habits, Chronic diarrhea, Constipation, Difficulty Swallowing, Excessive gas, Gets full quickly at meals, Hemorrhoids, Nausea, Rectal Pain and Vomiting. Female Genitourinary Present- Frequency. Not Present- Nocturia, Painful Urination, Pelvic Pain and Urgency. Musculoskeletal Present- Back Pain. Not Present- Joint Pain, Joint Stiffness, Muscle Pain, Muscle Weakness and Swelling of Extremities. Neurological Present- Seizures. Not Present- Decreased Memory, Fainting, Headaches, Numbness, Tingling, Tremor, Trouble walking and Weakness. Psychiatric Present- Anxiety, Bipolar and Depression. Not Present- Change in Sleep Pattern, Fearful and Frequent crying. Endocrine Present- Hot flashes. Not Present- Cold Intolerance, Excessive Hunger, Hair Changes, Heat Intolerance and New  Diabetes.  Vitals  Weight: 296.6 lb Height: 66in Body Surface Area: 2.36 m Body Mass Index: 47.87 kg/m  Temp.: 97.62F  Pulse: 107 (Regular)  BP: 138/72 (Sitting, Left Arm, Standard)      Physical Exam  The physical exam findings are as follows: Note:WDWN in NAD, mentation seems slightly slow, but patient is alert and cooperative Eyes: Pupils equal, round; sclera anicteric HENT:  Oral mucosa moist; good dentition Neck: No masses palpated, no thyromegaly Lungs: CTA bilaterally; normal respiratory effort CV: Regular rate and rhythm; no murmurs; extremities well-perfused with no edema Abd: +bowel sounds, obese, soft, non-tender, no palpable organomegaly; visible palpable mass about 4 cm above umbilicus When she is supine, this mass is reducible Skin: Warm, dry; no sign of jaundice Psychiatric - alert; calm mood and affect    Assessment & Plan  EPIGASTRIC HERNIA (K43.9)  Current Plans Schedule for Surgery - Open repair of epigastric ventral hernia with mesh. The surgical procedure has been discussed with the patient and her mother. Potential risks, benefits, alternative treatments, and expected outcomes have been explained. All of the patient's questions at this time have been answered. The likelihood of reaching the patient's treatment goal is good. The patient understand the proposed surgical procedure and wishes to proceed.   Imogene Burn. Georgette Dover, MD, [facility]  General/ Trauma Surgery  10/21/2017 7:13 AM

## 2017-10-21 NOTE — Op Note (Signed)
Ventral Hernia Repair Procedure Note  Indications: This is a 47 year old female with bipolar disorder, seizure disorder, sleep apnea, and learning disability who lives with her mother. She presents with a one-week history of a palpable mass in the midline above her umbilicus. This has caused some discomfort. The patient also had a tender rash over this area and a linear fashion. She was treated for shingles with Valtrex and those lesions have resolved. She was examined and was felt to have a small primary ventral hernia in the epigastrium. She is referred for surgical evaluation. The patient denies any obstructive symptoms.  Pre-operative Diagnosis: Epigastric ventral hernia  Post-operative Diagnosis: same  Surgeon: Maia Petties   Assistants: none  Anesthesia: General endotracheal anesthesia  ASA Class: 2  Procedure Details  The patient was seen in the Holding Room. The risks, benefits, complications, treatment options, and expected outcomes were discussed with the patient. The possibilities of reaction to medication, pulmonary aspiration, perforation of viscus, bleeding, recurrent infection, the need for additional procedures, failure to diagnose a condition, and creating a complication requiring transfusion or operation were discussed with the patient. The patient concurred with the proposed plan, giving informed consent.  The site of surgery properly noted/marked. The patient was taken to the operating room, identified as Nancy Hall and the procedure verified as ventral hernia repair. A Time Out was held and the above information confirmed.  The patient was placed supine.  After establishing general anesthesia, the abdomen was prepped with Chloraprep and draped in sterile fashion.  We made a vertical incision over the palpable hernia above the umbilicus. Dissection was carried down to the hernia sac located above the fascia and mobilized from surrounding structures.  Intact  fascia was identified circumferentially around the defect.  The defect measured 5 cm.  We opened the hernia sac, which contained a 4 cm area of ischemic omentum.  We resected the omentum with Kelly clamps and 2-0 silk ties.  We used a 11.4 cm round Ventrio mesh and secured this to the fascia with interrupted 0 Novofil sutures.  The fascial defect was reapproximated with interrupted figure-of-8 1 Novofil sutures.  The subcutaneous tissues were irrigated.   Hemostasis was confirmed.  The skin incision was closed in layers with a 3-0 Vicryl subcutaneous layer and 4-0 Monocryl subcuticular closure.  Steri-Strips were applied at the end of the operation.    Instrument, sponge, and needle counts were correct prior to closure and at the conclusion of the case.   Findings: 5 cm defect containing omentum  Estimated Blood Loss:  less than 50 mL         Drains: none                      Complications:  None; patient tolerated the procedure well.         Disposition: PACU - hemodynamically stable.         Condition: stable  Nancy Burn. Georgette Dover, MD, [facility]  General/ Trauma Surgery  10/21/2017 8:58 AM

## 2017-10-22 ENCOUNTER — Encounter (HOSPITAL_COMMUNITY): Payer: Self-pay | Admitting: Surgery

## 2017-10-23 ENCOUNTER — Other Ambulatory Visit: Payer: Self-pay | Admitting: Nurse Practitioner

## 2017-10-23 ENCOUNTER — Other Ambulatory Visit: Payer: Self-pay | Admitting: Family Medicine

## 2017-10-28 ENCOUNTER — Ambulatory Visit (INDEPENDENT_AMBULATORY_CARE_PROVIDER_SITE_OTHER): Payer: Medicare Other | Admitting: Family Medicine

## 2017-11-03 DIAGNOSIS — F39 Unspecified mood [affective] disorder: Secondary | ICD-10-CM | POA: Diagnosis not present

## 2017-11-21 ENCOUNTER — Encounter: Payer: Self-pay | Admitting: Family Medicine

## 2017-11-21 ENCOUNTER — Ambulatory Visit (INDEPENDENT_AMBULATORY_CARE_PROVIDER_SITE_OTHER): Payer: Medicare Other | Admitting: Family Medicine

## 2017-11-21 ENCOUNTER — Ambulatory Visit (INDEPENDENT_AMBULATORY_CARE_PROVIDER_SITE_OTHER): Payer: Medicare Other

## 2017-11-21 VITALS — BP 128/80 | HR 80 | Ht 66.0 in | Wt 297.0 lb

## 2017-11-21 DIAGNOSIS — S63651A Sprain of metacarpophalangeal joint of left index finger, initial encounter: Secondary | ICD-10-CM | POA: Insufficient documentation

## 2017-11-21 DIAGNOSIS — M79642 Pain in left hand: Secondary | ICD-10-CM | POA: Diagnosis not present

## 2017-11-21 DIAGNOSIS — M20022 Boutonniere deformity of left finger(s): Secondary | ICD-10-CM

## 2017-11-21 NOTE — Progress Notes (Signed)
Subjective:  Patient ID: Nancy Hall, female    DOB: [birth year]  Age: 47 y.o. MRN: [id number]  CC: finger on right hand swollen   HPI Nancy Hall presents for evaluation of a 3 to 4-day history of soreness and swelling in the second and third MCPs of her left hand.  There is been no injury.  She is left-hand dominant.  Status post ventral hernia repair 3 weeks ago.  Positive family history of rheumatoid and osteoarthritis.  Patient denies increased activity with this hand.  She has been taking Tylenol as needed.  Outpatient Medications Prior to Visit  Medication Sig Dispense Refill  . ARIPiprazole (ABILIFY) 15 MG tablet TAKE 1 TABLET BY MOUTH EVERY DAY 90 tablet 1  . gabapentin (NEURONTIN) 300 MG capsule TAKE 1 CAPSULE BY MOUTH EVERYDAY AT BEDTIME 90 capsule 1  . lamoTRIgine (LAMICTAL) 150 MG tablet Take 1 tablet (150 mg total) by mouth 2 (two) times daily. 60 tablet 11  . levothyroxine (SYNTHROID, LEVOTHROID) 137 MCG tablet TAKE 1 TABLET (137 MCG TOTAL) BY MOUTH DAILY BEFORE BREAKFAST. 60 tablet 3  . omeprazole (PRILOSEC) 40 MG capsule TAKE 1 CAPSULE (40 MG TOTAL) BY MOUTH DAILY. 90 capsule 0  . oxyCODONE (OXY IR/ROXICODONE) 5 MG immediate release tablet Take 1 tablet (5 mg total) by mouth every 6 (six) hours as needed for severe pain. 20 tablet 0  . sertraline (ZOLOFT) 100 MG tablet Take 1 tablet (100 mg total) by mouth daily. 90 tablet 3  . topiramate (TOPAMAX) 100 MG tablet TAKE 1 TABLET IN THE MORNING AND 1 & 1/2 TABLETS AT NIGHT 75 tablet 10  . valACYclovir (VALTREX) 1000 MG tablet Take 1 tablet (1,000 mg total) by mouth 3 (three) times daily. 21 tablet 0   No facility-administered medications prior to visit.     ROS Review of Systems  Constitutional: Negative for chills, fatigue, fever and unexpected weight change.  Musculoskeletal: Positive for arthralgias. Negative for neck pain and neck stiffness.  Skin: Negative for rash and wound.  Allergic/Immunologic: Negative for  immunocompromised state.  Neurological: Negative for weakness, light-headedness and numbness.  Hematological: Does not bruise/bleed easily.  Psychiatric/Behavioral: Negative.     Objective:  BP 128/80   Pulse 80   Ht 5\' 6"  (1.676 m)   Wt 297 lb (134.7 kg)   LMP 12/19/2014 (Exact Date)   SpO2 100%   BMI 47.94 kg/m   BP Readings from Last 3 Encounters:  11/21/17 128/80  10/21/17 117/73  10/13/17 116/79    Wt Readings from Last 3 Encounters:  11/21/17 297 lb (134.7 kg)  10/13/17 299 lb 4.8 oz (135.8 kg)  09/08/17 299 lb 12.8 oz (136 kg)    Physical Exam  Constitutional: She is oriented to person, place, and time. She appears well-developed and well-nourished. No distress.  HENT:  Head: Normocephalic and atraumatic.  Right Ear: External ear normal.  Left Ear: External ear normal.  Eyes: Right eye exhibits no discharge. Left eye exhibits no discharge. No scleral icterus.  Cardiovascular:  Pulses:      Radial pulses are 2+ on the left side.  Pulmonary/Chest: Effort normal.  Musculoskeletal:       Hands: Neurological: She is alert and oriented to person, place, and time.  Skin: Skin is warm and dry. She is not diaphoretic.  Psychiatric: She has a normal mood and affect. Her behavior is normal.    Lab Results  Component Value Date   WBC 6.0 10/13/2017   HGB  11.6 (L) 10/13/2017   HCT 37.6 10/13/2017   PLT 176 10/13/2017   GLUCOSE 133 (H) 10/13/2017   CHOL 196 12/19/2016   TRIG 148.0 12/19/2016   HDL 51.40 12/19/2016   LDLDIRECT 86.7 05/06/2012   LDLCALC 115 (H) 12/19/2016   ALT 16 07/29/2017   AST 14 07/29/2017   NA 139 10/13/2017   K 3.8 10/13/2017   CL 110 10/13/2017   CREATININE 0.98 10/13/2017   BUN 10 10/13/2017   CO2 20 (L) 10/13/2017   TSH 3.86 07/29/2017   HGBA1C 5.9 07/10/2016    No results found.  Assessment & Plan:   Nancy Hall was seen today for finger on right hand swollen.  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 Nancy Hall maintain her omeprazole, lamoTRIgine, sertraline, levothyroxine, valACYclovir, ARIPiprazole, oxyCODONE, gabapentin, and topiramate.  No orders of the defined types were placed in this encounter.  Patient will use Tylenol as needed follow-up if it does not improve in the next few weeks.  Follow-up: Return if symptoms worsen or fail to improve.  Libby Maw, MD

## 2017-11-22 LAB — RHEUMATOID FACTOR: Rhuematoid fact SerPl-aCnc: <14 IU/mL (ref <14)

## 2017-11-26 ENCOUNTER — Ambulatory Visit: Payer: Self-pay | Admitting: Family Medicine

## 2017-11-27 ENCOUNTER — Ambulatory Visit (INDEPENDENT_AMBULATORY_CARE_PROVIDER_SITE_OTHER): Payer: Medicare Other | Admitting: Psychology

## 2017-11-27 DIAGNOSIS — F3132 Bipolar disorder, current episode depressed, moderate: Secondary | ICD-10-CM | POA: Diagnosis not present

## 2017-12-05 DIAGNOSIS — F39 Unspecified mood [affective] disorder: Secondary | ICD-10-CM | POA: Diagnosis not present

## 2017-12-12 ENCOUNTER — Encounter

## 2017-12-12 ENCOUNTER — Other Ambulatory Visit: Payer: Self-pay

## 2017-12-12 ENCOUNTER — Encounter: Payer: Self-pay | Admitting: Neurology

## 2017-12-12 ENCOUNTER — Ambulatory Visit (INDEPENDENT_AMBULATORY_CARE_PROVIDER_SITE_OTHER): Payer: Medicare Other | Admitting: Neurology

## 2017-12-12 VITALS — BP 130/77 | HR 71 | Ht 66.0 in | Wt 293.0 lb

## 2017-12-12 DIAGNOSIS — G40309 Generalized idiopathic epilepsy and epileptic syndromes, not intractable, without status epilepticus: Secondary | ICD-10-CM | POA: Diagnosis not present

## 2017-12-12 MED ORDER — TOPIRAMATE 100 MG PO TABS: ORAL_TABLET | ORAL | 3 refills | Status: DC

## 2017-12-12 MED ORDER — LAMOTRIGINE 150 MG PO TABS: 150.0000 mg | ORAL_TABLET | Freq: Two times a day (BID) | ORAL | 3 refills | Status: DC

## 2017-12-12 NOTE — Progress Notes (Signed)
Reason for visit: Seizures versus pseudoseizures  Nancy Hall is an 47 y.o. female  History of present illness:  Nancy Hall is a 48 year old left-handed white female with a history of obesity and a history of bipolar disorder.  The patient is followed through psychiatry, she had been on Abilify until about 1 month ago.  Apparently, she developed tremors involving both upper extremities and a jaw tremor, she was taken off of Abilify 1 month ago, and the tremors have essentially resolved.  The patient had no alteration in voice or gait on the medication.  She is now on [place], she seems to tolerate this better.  The patient is on Topamax, gabapentin, and lamotrigine.  The patient tolerates these medications well, she has not had a seizure type event in over 5 years.  The patient indicates that she sleeps fairly well at night usually.  She has had blood work done within the last 4 months or so.  The patient returns for an evaluation.  Past Medical History:  Diagnosis Date  . Anemia 2016  . Anxiety   . Bipolar disorder ([place])   . Cancer ([place])   . Complication of anesthesia   . Depression   . Epigastric hernia   . Esophagitis    LA Class A  . GERD (gastroesophageal reflux disease)   . Hiatal hernia   . HOH (hard of hearing)   . Hypothyroidism   . Learning disability   . Obesity   . PONV (postoperative nausea and vomiting)   . Pseudoseizures   . Seizure ([place]) 2014   last seizure 2(two) years ago  . Seizures ([place])    according to echart- seizures vs pseudoseizures  . Sleep apnea    No cpap  . Thyroid disease   . Ventral hernia     Past Surgical History:  Procedure Laterality Date  . ABDOMINAL HYSTERECTOMY N/A 01/09/2015   Procedure: HYSTERECTOMY ABDOMINAL/BILATERAL SALPINGECTOMY;  Surgeon: Rubie Maid, MD;  Location: [facility];  Service: Gynecology;  Laterality: N/A;  . DILATION AND CURETTAGE OF UTERUS    . HYSTEROSCOPY W/D&C N/A 12/19/2014   Procedure: DILATATION AND  CURETTAGE /HYSTEROSCOPY;  Surgeon: Rubie Maid, MD;  Location: [facility];  Service: Gynecology;  Laterality: N/A;  . INNER EAR SURGERY Bilateral 1994   poor historian  . INSERTION OF MESH N/A 10/21/2017   Procedure: INSERTION OF MESH;  Surgeon: Donnie Mesa, MD;  Location: [place];  Service: General;  Laterality: N/A;  . TONSILLECTOMY    . VENTRAL HERNIA REPAIR N/A 10/21/2017   Procedure: OPEN REPAIR EPIGASTIC VENTRAL HERNIA WITH  MESH ERAS PATHWAY;  Surgeon: Donnie Mesa, MD;  Location: [place];  Service: General;  Laterality: N/A;    Family History  Problem Relation Age of Onset  . Colon polyps Mother   . Celiac disease Mother   . Colon polyps Father   . Celiac disease Brother   . Diabetes Sister   . Diabetes Paternal Grandmother   . Heart disease Paternal Grandfather   . Colon polyps Paternal Aunt   . Diabetes Maternal Aunt   . Heart disease Maternal Grandfather   . Heart disease Maternal Uncle   . Heart disease Maternal Aunt   . Seizures Neg Hx     Social history:  reports that she has never smoked. She has never used smokeless tobacco. She reports that she does not drink alcohol or use drugs.    Allergies  Allergen Reactions  . Penicillins Rash  Has patient had a PCN reaction causing immediate rash, facial/tongue/throat swelling, SOB or lightheadedness with hypotension: Yes Has patient had a PCN reaction causing severe rash involving mucus membranes or skin necrosis: No Has patient had a PCN reaction that required hospitalization: No Has patient had a PCN reaction occurring within the last 10 years: Yes If all of the above answers are "NO", then may proceed with Cephalosporin use.  3g Ancef administered 10/21/2017 without reaction/complic    Medications:  Prior to Admission medications   Medication Sig Start Date End Date Taking? Authorizing Provider  gabapentin (NEURONTIN) 300 MG capsule TAKE 1 CAPSULE BY MOUTH EVERYDAY AT BEDTIME 10/23/17  Yes Lucille Passy, MD    lamoTRIgine (LAMICTAL) 150 MG tablet Take 1 tablet (150 mg total) by mouth 2 (two) times daily. 12/12/17  Yes Kathrynn Ducking, MD  LATUDA 40 MG TABS tablet Take 40 mg by mouth daily. 12/09/17  Yes [provider]  levothyroxine (SYNTHROID, LEVOTHROID) 137 MCG tablet TAKE 1 TABLET (137 MCG TOTAL) BY MOUTH DAILY BEFORE BREAKFAST. 07/30/17  Yes Philemon Kingdom, MD  omeprazole (PRILOSEC) 40 MG capsule TAKE 1 CAPSULE (40 MG TOTAL) BY MOUTH DAILY. 06/03/16  Yes Lucille Passy, MD  oxyCODONE (OXY IR/ROXICODONE) 5 MG immediate release tablet Take 1 tablet (5 mg total) by mouth every 6 (six) hours as needed for severe pain. 10/21/17  Yes Donnie Mesa, MD  sertraline (ZOLOFT) 100 MG tablet Take 1 tablet (100 mg total) by mouth daily. 03/27/17  Yes Lucille Passy, MD  topiramate (TOPAMAX) 100 MG tablet One tablet in the morning and 1.5 tablets at night 12/12/17  Yes Kathrynn Ducking, MD    ROS:  Out of a complete 14 system review of symptoms, the patient complains only of the following symptoms, and all other reviewed systems are negative.  Seizure, tremors  Blood pressure 130/77, pulse 71, height 5\' 6"  (1.676 m), weight 293 lb (132.9 kg), last menstrual period 12/19/2014.  Physical Exam  General: The patient is alert and cooperative at the time of the examination.  The patient is morbidly obese.  Skin: No significant peripheral edema is noted.   Neurologic Exam  Mental status: The patient is alert and oriented x 3 at the time of the examination. The patient has apparent normal recent and remote memory, with an apparently normal attention span and concentration ability.   Cranial nerves: Facial symmetry is present. Speech is normal, no aphasia or dysarthria is noted. Extraocular movements are full. Visual fields are full.  Motor: The patient has good strength in all 4 extremities.  Sensory examination: Soft touch sensation is symmetric on the face, arms, and legs.  Coordination: The  patient has good finger-nose-finger and heel-to-shin bilaterally.  Gait and station: The patient has a normal gait. Tandem gait is slightly unsteady. Romberg is negative. No drift is seen.  Reflexes: Deep tendon reflexes are symmetric.   Assessment/Plan:  1.  Seizures versus pseudoseizures  2.  Bipolar disorder  3.  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  The patient is doing well on the Lamictal and Topamax, prescriptions were sent in.  She has had recent blood work done.  The patient will follow-up in 1 year, sooner if needed.  Greater than 50% of the visit was spent in counseling and coordination of care.  Face-to-face time with the patient  was 20 minutes.   Jill Alexanders MD 12/12/2017 10:48 AM  [facility] [address] [place], [place] [address]  Phone [phone] Fax [phone]

## 2017-12-18 ENCOUNTER — Ambulatory Visit (INDEPENDENT_AMBULATORY_CARE_PROVIDER_SITE_OTHER): Payer: Medicare Other | Admitting: Psychology

## 2017-12-18 DIAGNOSIS — F3132 Bipolar disorder, current episode depressed, moderate: Secondary | ICD-10-CM | POA: Diagnosis not present

## 2017-12-29 ENCOUNTER — Encounter: Payer: Self-pay | Admitting: Family Medicine

## 2017-12-29 ENCOUNTER — Ambulatory Visit (INDEPENDENT_AMBULATORY_CARE_PROVIDER_SITE_OTHER): Payer: Medicare Other | Admitting: Family Medicine

## 2017-12-29 VITALS — BP 118/78 | HR 70 | Temp 97.7°F | Ht 66.0 in | Wt 286.6 lb

## 2017-12-29 DIAGNOSIS — M720 Palmar fascial fibromatosis [Dupuytren]: Secondary | ICD-10-CM | POA: Insufficient documentation

## 2017-12-29 DIAGNOSIS — E785 Hyperlipidemia, unspecified: Secondary | ICD-10-CM | POA: Diagnosis not present

## 2017-12-29 DIAGNOSIS — R739 Hyperglycemia, unspecified: Secondary | ICD-10-CM

## 2017-12-29 DIAGNOSIS — Z23 Encounter for immunization: Secondary | ICD-10-CM

## 2017-12-29 DIAGNOSIS — R232 Flushing: Secondary | ICD-10-CM

## 2017-12-29 DIAGNOSIS — E039 Hypothyroidism, unspecified: Secondary | ICD-10-CM

## 2017-12-29 DIAGNOSIS — F333 Major depressive disorder, recurrent, severe with psychotic symptoms: Secondary | ICD-10-CM | POA: Diagnosis not present

## 2017-12-29 DIAGNOSIS — G40309 Generalized idiopathic epilepsy and epileptic syndromes, not intractable, without status epilepticus: Secondary | ICD-10-CM | POA: Diagnosis not present

## 2017-12-29 DIAGNOSIS — D229 Melanocytic nevi, unspecified: Secondary | ICD-10-CM

## 2017-12-29 DIAGNOSIS — F3170 Bipolar disorder, currently in remission, most recent episode unspecified: Secondary | ICD-10-CM | POA: Diagnosis not present

## 2017-12-29 LAB — LIPID PANEL
CHOL/HDL RATIO: 5
CHOLESTEROL: 179 mg/dL (ref 0–200)
HDL: 36.4 mg/dL — AB (ref >39.00)
LDL CALC: 119 mg/dL — AB (ref 0–99)
NonHDL: 142.31
TRIGLYCERIDES: 118 mg/dL (ref 0.0–149.0)
VLDL: 23.6 mg/dL (ref 0.0–40.0)

## 2017-12-29 LAB — HEMOGLOBIN A1C: Hgb A1c MFr Bld: 5.9 % (ref 4.6–6.5)

## 2017-12-29 LAB — T4, FREE: Free T4: 0.94 ng/dL (ref 0.60–1.60)

## 2017-12-29 LAB — TSH: TSH: 5.49 u[IU]/mL — ABNORMAL HIGH (ref 0.35–4.50)

## 2017-12-29 NOTE — Progress Notes (Signed)
Subjective:   Patient ID: [name], female    DOB: 06/30/1970, 47 y.o.   MRN: [id number]  Nancy Hall is a pleasant 47 y.o. year old female who presents to clinic today with Follow-up (Patient is here today for a follow-up.  She is scheduled to see Dr. Cruzita Lederer on 11.26.2019; sees Bambi on 10.7.2019; then sees Neurologist on 9.21.2020.  She is currently fasting.  She agrees to get her flu shot.  )  on 12/29/2017  HPI:  Had hernia repair on 10/21/17 (Dr. Georgette Dover).  Hypothyroidism- sees Dr. Cruzita Lederer.   She is scheduled to see her again on 02/24/18.   Lab Results  Component Value Date   TSH 3.86 07/29/2017   Seizure d/o - first grandmal seziure at 31 yo.  Has "emotional seizures/pseudoseizures," had a few more around her father's death in 01/29/13. On Lamictal.  Followed by neuro- last saw Dr. Jannifer Franklin on 12/12/17- stopped abilify and tremors have resolved.  Her next neurology appointment scheduled for 12/21/18.  Bipolar disorder- Followed by Desmond Dike- On Latuda, zoloft. Latuda started a couple of months ago . Nancy Hall feels it is helping. Has appt with Bambi Cottle on 01/05/18.  Has seen her a few times.  Has noticed painful nodules in the palm of her left hand- noticed it a few weeks ago.  Painful to grip things. Lab Results  Component Value Date   CHOL 196 12/19/2016   HDL 51.40 12/19/2016   LDLCALC 115 (H) 12/19/2016   LDLDIRECT 86.7 05/06/2012   TRIG 148.0 12/19/2016   CHOLHDL 4 12/19/2016   Lab Results  Component Value Date   ALT 16 07/29/2017   AST 14 07/29/2017   ALKPHOS 76 07/29/2017   BILITOT 0.4 07/29/2017   Lab Results  Component Value Date   NA 139 10/13/2017   K 3.8 10/13/2017   CL 110 10/13/2017   CO2 20 (L) 10/13/2017   Lab Results  Component Value Date   WBC 6.0 10/13/2017   HGB 11.6 (L) 10/13/2017   HCT 37.6 10/13/2017   MCV 96.7 10/13/2017   PLT 176 10/13/2017   Current Outpatient Medications on File Prior to Visit  Medication Sig Dispense  Refill  . gabapentin (NEURONTIN) 300 MG capsule TAKE 1 CAPSULE BY MOUTH EVERYDAY AT BEDTIME 90 capsule 1  . lamoTRIgine (LAMICTAL) 150 MG tablet Take 1 tablet (150 mg total) by mouth 2 (two) times daily. 180 tablet 3  . LATUDA 40 MG TABS tablet Take 40 mg by mouth daily.    Marland Kitchen levothyroxine (SYNTHROID, LEVOTHROID) 137 MCG tablet TAKE 1 TABLET (137 MCG TOTAL) BY MOUTH DAILY BEFORE BREAKFAST. 60 tablet 3  . omeprazole (PRILOSEC) 40 MG capsule TAKE 1 CAPSULE (40 MG TOTAL) BY MOUTH DAILY. 90 capsule 0  . oxyCODONE (OXY IR/ROXICODONE) 5 MG immediate release tablet Take 1 tablet (5 mg total) by mouth every 6 (six) hours as needed for severe pain. 20 tablet 0  . sertraline (ZOLOFT) 100 MG tablet Take 1 tablet (100 mg total) by mouth daily. 90 tablet 3  . topiramate (TOPAMAX) 100 MG tablet One tablet in the morning and 1.5 tablets at night 225 tablet 3   No current facility-administered medications on file prior to visit.     Allergies  Allergen Reactions  . Penicillins Rash    Has patient had a PCN reaction causing immediate rash, facial/tongue/throat swelling, SOB or lightheadedness with hypotension: Yes Has patient had a PCN reaction causing severe rash involving mucus membranes or skin necrosis:  No Has patient had a PCN reaction that required hospitalization: No Has patient had a PCN reaction occurring within the last 10 years: Yes If all of the above answers are "NO", then may proceed with Cephalosporin use.  3g Ancef administered 10/21/2017 without reaction/complic    Past Medical History:  Diagnosis Date  . Anemia 2016  . Anxiety   . Bipolar disorder ([place])   . Cancer ([place])   . Complication of anesthesia   . Depression   . Epigastric hernia   . Esophagitis    LA Class A  . GERD (gastroesophageal reflux disease)   . Hiatal hernia   . HOH (hard of hearing)   . Hypothyroidism   . Learning disability   . Obesity   . PONV (postoperative nausea and vomiting)   . Pseudoseizures   .  Seizure ([place]) 2014   last seizure 2(two) years ago  . Seizures ([place])    according to echart- seizures vs pseudoseizures  . Sleep apnea    No cpap  . Thyroid disease   . Ventral hernia     Past Surgical History:  Procedure Laterality Date  . ABDOMINAL HYSTERECTOMY N/A 01/09/2015   Procedure: HYSTERECTOMY ABDOMINAL/BILATERAL SALPINGECTOMY;  Surgeon: Rubie Maid, MD;  Location: [facility];  Service: Gynecology;  Laterality: N/A;  . DILATION AND CURETTAGE OF UTERUS    . HYSTEROSCOPY W/D&C N/A 12/19/2014   Procedure: DILATATION AND CURETTAGE /HYSTEROSCOPY;  Surgeon: Rubie Maid, MD;  Location: [facility];  Service: Gynecology;  Laterality: N/A;  . INNER EAR SURGERY Bilateral 1994   poor historian  . INSERTION OF MESH N/A 10/21/2017   Procedure: INSERTION OF MESH;  Surgeon: Donnie Mesa, MD;  Location: [place];  Service: General;  Laterality: N/A;  . TONSILLECTOMY    . VENTRAL HERNIA REPAIR N/A 10/21/2017   Procedure: OPEN REPAIR EPIGASTIC VENTRAL HERNIA WITH  MESH ERAS PATHWAY;  Surgeon: Donnie Mesa, MD;  Location: [place];  Service: General;  Laterality: N/A;    Family History  Problem Relation Age of Onset  . Colon polyps Mother   . Celiac disease Mother   . Colon polyps Father   . Celiac disease Brother   . Diabetes Sister   . Diabetes Paternal Grandmother   . Heart disease Paternal Grandfather   . Colon polyps Paternal Aunt   . Diabetes Maternal Aunt   . Heart disease Maternal Grandfather   . Heart disease Maternal Uncle   . Heart disease Maternal Aunt   . Seizures Neg Hx     Social History   Socioeconomic History  . Marital status: Single    Spouse name: Not on file  . Number of children: 0  . Years of education: 10  . Highest education level: Not on file  Occupational History  . Not on file  Social Needs  . Financial resource strain: Not on file  . Food insecurity:    Worry: Not on file    Inability: Not on file  . Transportation needs:    Medical: Not on file      Non-medical: Not on file  Tobacco Use  . Smoking status: Never Smoker  . Smokeless tobacco: Never Used  Substance and Sexual Activity  . Alcohol use: No    Alcohol/week: 0.0 standard drinks  . Drug use: No  . Sexual activity: Never  Lifestyle  . Physical activity:    Days per week: Not on file    Minutes per session: Not on file  . Stress:  Not on file  Relationships  . Social connections:    Talks on phone: Not on file    Gets together: Not on file    Attends religious service: Not on file    Active member of club or organization: Not on file    Attends meetings of clubs or organizations: Not on file    Relationship status: Not on file  . Intimate partner violence:    Fear of current or ex partner: Not on file    Emotionally abused: Not on file    Physically abused: Not on file    Forced sexual activity: Not on file  Other Topics Concern  . Not on file  Social History Narrative   Patient lives at home with mom.    Patient does not have any children.    Patient has a 10th grade education.    Patient is single.    Patient is left handed.    Does not have a living will or HPOA- full code.   The PMH, PSH, Social History, Family History, Medications, and allergies have been reviewed in [facility], and have been updated if relevant.  Review of Systems  Constitutional: Negative.   HENT: Negative.   Eyes: Negative.   Respiratory: Negative.   Cardiovascular: Negative.   Gastrointestinal: Negative.   Endocrine: Negative.   Genitourinary: Negative.   Musculoskeletal: Negative.   Allergic/Immunologic: Negative.   Neurological: Negative.   Hematological: Negative.   Psychiatric/Behavioral: Negative.   All other systems reviewed and are negative.      Objective:    BP 118/78 (BP Location: Left Arm, Patient Position: Sitting, Cuff Size: Large)   Pulse 70   Temp 97.7 F (36.5 C) (Oral)   Ht 5\' 6"  (1.676 m)   Wt 286 lb 9.6 oz (130 kg)   LMP 12/19/2014 (Exact Date)   SpO2  99%   BMI 46.26 kg/m    Physical Exam   General:  Well-developed,well-nourished,in no acute distress; alert,appropriate and cooperative throughout examination Head:  normocephalic and atraumatic.   Eyes:  vision grossly intact, PERRL Ears:  R ear normal and L ear normal externally, TMs clear bilaterally Nose:  no external deformity.   Mouth:  good dentition.   Neck:  No deformities, masses, or tenderness noted. Lungs:  Normal respiratory effort, chest expands symmetrically. Lungs are clear to auscultation, no crackles or wheezes. Heart:  Normal rate and regular rhythm. S1 and S2 normal without gallop, murmur, click, rub or other extra sounds. Abdomen:  Bowel sounds positive,abdomen soft and non-tender without masses, organomegaly or hernias noted.ion Msk: left hand- palpable subcutaneous nodules palm of left hand Extremities:  No clubbing, cyanosis, edema, or deformity noted with normal full range of motion of all joints.   Neurologic:  alert & oriented X3 and gait normal.   Skin: Atypical mole on right shoulder Cervical Nodes:  No lymphadenopathy noted Axillary Nodes:  No palpable lymphadenopathy Psych:  Cognition and judgment appear intact. Alert and cooperative with normal attention span and concentration. No apparent delusions, illusions, hallucinations          Assessment & Plan:   Dyslipidemia - Plan: Lipid panel, TSH, T4, free  Need for influenza vaccination - Plan: Flu Vaccine QUAD 6+ mos PF IM (Fluarix Quad PF)  Bipolar disorder in partial remission, most recent episode unspecified type (HCC)  Depression, major, recurrent, severe with psychosis ([place])  Hot flashes  Generalized convulsive epilepsy ([place])  Hypothyroidism, unspecified type - Plan: TSH, T4, free  Hyperglycemia -  Plan: Hemoglobin A1c  Dupuytren's contracture of left hand - Plan: Ambulatory referral to Hand Surgery, Ambulatory referral to Physical Therapy  Atypical mole - Plan: Ambulatory referral  to Dermatology No follow-ups on file.

## 2017-12-29 NOTE — Assessment & Plan Note (Signed)
New- she is not sure how long it has been there or if it has changed.  ? SK but has a more irregular border.  She is a poor historian, so I will  refer to derm for mole check and have her follow up with me in 3 months to look at the mole incase she has not been able to see dermatology within that time period .  The patient indicates understanding of these issues and agrees with the plan.

## 2017-12-29 NOTE — Assessment & Plan Note (Signed)
Well controlled on current rxs. No changes made- followed by [place] and Nancy Hall.

## 2017-12-29 NOTE — Assessment & Plan Note (Signed)
Followed by Dr. Cruzita Lederer.  Has follow up appt with her scheduled. No changes made to rxs.

## 2017-12-29 NOTE — Assessment & Plan Note (Signed)
Labs today

## 2017-12-29 NOTE — Assessment & Plan Note (Signed)
New- discussed possible etiology, including anti convulsants she is taking. Will check a1c today as well. Refer to hand surgeon and PT for treatment. The patient indicates understanding of these issues and agrees with the plan.

## 2017-12-29 NOTE — Assessment & Plan Note (Signed)
Followed by Dr. Jannifer Franklin- just saw him earlier this month. No changes made to rxs.

## 2017-12-29 NOTE — Assessment & Plan Note (Signed)
Well controlled. Followed by psych and psychiatry. No changes made to rxs.

## 2017-12-29 NOTE — Patient Instructions (Addendum)
Great to see you. I will call you with your lab results from today and you can view them online.    Dupuytren's contracture is what you have in your left hand.  This can be sometimes caused by anti seizures medications, elevated blood sugar.  We are referring you to a hand doctor to talk about treatment options.    Please schedule with our Wellness Coach :)  Come see me in 3 months for a mole check.   Dupuytren Contracture Dupuytren contracture is a condition in which tissue under the skin of the palm becomes abnormally thickened. This causes one or more of the fingers to curl inward (contract) toward the palm. Eventually, the fingers may not be able to straighten out. This condition affects some or all of the fingers and the palm of the hand. It is often passed along from parent to child (inherited). Dupuytren contracture is a long-term (chronic) condition that develops (progresses) slowly over time. There is no cure, but symptoms can be managed and progression can be slowed with treatment. This condition is usually not dangerous or painful, but it can interfere with everyday tasks. What are the causes? This condition is caused by tissue (fascia) in the palm getting thicker and tighter. When the fascia thickens, it pulls on the cords of tissue (tendons) that control finger movement. This causes the fingers to contract. The cause of fascia thickening is not known. What increases the risk? This condition may be more likely to develop in:  People who are age 10 or older.  Men.  People with a family history of this condition.  People who use tobacco products, including cigarettes, chewing tobacco, and e-cigarettes.  People who drink alcohol excessively.  People with diabetes.  People with autoimmune diseases, such as HIV.  People with seizure disorders.  What are the signs or symptoms? Symptoms may develop in one or both hands. Any of the fingers can contract. The fingers farthest  from the thumb are commonly affected. Usually, this condition is painless. You may have discomfort when holding or grabbing objects. Early symptoms of this condition may include:  Thick, puckered skin on the hand.  One or more lumps (nodules) on the palm. Nodules may be tender when they first appear, but they are generally painless.  Symptoms of this condition develop slowly over months or years. Later symptoms of this condition may include:  Thick cords of tissue in the palm.  Fingers curled up toward the palm.  Inability to straighten the fingers into their normal position.  How is this diagnosed? This condition is diagnosed with a physical exam, which may include:  Looking at your hands and feeling your hands. This is to check for thickened fascia and nodules.  Measuring finger motion.  Doing the [employer]. You may be asked to try to put your hand on a surface, with your palm down and your fingers straight out.  How is this treated? There is no cure for this condition, but treatment can make symptoms more manageable and relieve discomfort. Treatment options may include:  Physical therapy. This can strengthen your hand and increase flexibility.  Occupational therapy. This can help you with everyday tasks that may be more difficult because of your condition.  A hand splint.  Shots (injections). Substances may be injected into your hand, such as: ? Medicines that help to decrease swelling (corticosteroids). ? Proteins (collagenase) to weaken thick tissue. After a collagenase injection, your health care provider may stretch your fingers.  Needle aponeurotomy. In this procedure, a needle is pushed through the skin and into the fascia. Moving the needle against the fascia can weaken or break up the thick tissue.  Surgery. This may be needed if your condition causes discomfort or interferes with everyday activities. Physical therapy is usually needed after  surgery.  In some cases, symptoms never develop to the point of needing major treatment, and caring for yourself at home can be enough to manage your condition. Symptoms often return after treatment. Follow these instructions at home: If you have a splint:  Do not put pressure on any part of the splint until it is fully hardened. This may take several hours.  Wear the splint as told by your health care provider. Remove it only as told by your health care provider.  Loosen the splint if your fingers tingle, become numb, or turn cold and blue.  Do not let your splint get wet if it is not waterproof. ? If your splint is not waterproof, cover it with a watertight covering when you take a bath or a shower. ? Do not take baths, swim, or use a hot tub until your health care provider approves. Ask your health care provider if you can take showers. You may only be allowed to take sponge baths for bathing.  Keep the splint clean.  Ask your health care provider when it is safe to drive. Hand Care  Take these actions to help protect your hand from possible injury: ? Use tools that have padded grips. ? Wear protective gloves while you work with your hands. ? Avoid repetitive hand movements.  Avoid actions that cause pain or discomfort.  Stretch your hand by gently pulling your fingers backward toward your wrist. Do this as often as is comfortable. Stop if this causes pain.  Gently massage your hand as often as is comfortable.  If directed, apply heat to the affected area as often as told by your health care provider. Use the heat source that your health care provider recommends, such as a moist heat pack or a heating pad. ? Place a towel between your skin and the heat source. ? Leave the heat on for 20-30 minutes. ? Remove the heat if your skin turns bright red. This is especially important if you are unable to feel pain, heat, or cold. You may have a greater risk of getting burned. General  instructions  Take over-the-counter and prescription medicines only as told by your health care provider.  Manage any other conditions that you have, such as diabetes.  If physical therapy was prescribed, do exercises as told by your health care provider.  Keep all follow-up visits as told by your health care provider. This is important. Contact a health care provider if:  You develop new symptoms, or your symptoms get worse.  You have pain that gets worse or does not get better with medicine.  You have difficulty or discomfort with everyday tasks.  You have problems with your splint.  You develop numbness or tingling. Get help right away if:  You have severe pain.  Your fingers change color or become unusually cold. This information is not intended to replace advice given to you by your health care provider. Make sure you discuss any questions you have with your health care provider. Document Released: 01/13/2009 Document Revised: 05/02/2015 Document Reviewed: 08/10/2014 Elsevier Interactive Patient Education  [employer].

## 2018-01-01 ENCOUNTER — Encounter (INDEPENDENT_AMBULATORY_CARE_PROVIDER_SITE_OTHER): Payer: Self-pay | Admitting: Orthopaedic Surgery

## 2018-01-01 ENCOUNTER — Ambulatory Visit (INDEPENDENT_AMBULATORY_CARE_PROVIDER_SITE_OTHER): Payer: Medicare Other | Admitting: Orthopaedic Surgery

## 2018-01-01 DIAGNOSIS — M79642 Pain in left hand: Secondary | ICD-10-CM

## 2018-01-01 MED ORDER — PREDNISONE 10 MG (21) PO TBPK: ORAL_TABLET | ORAL | 0 refills | Status: DC

## 2018-01-01 NOTE — Progress Notes (Signed)
Office Visit Note   Patient: Nancy Hall           Date of Birth: 08/31/1970           MRN: [id number] Visit Date: 01/01/2018              Requested by: Lucille Passy, MD [place], [place] [address] PCP: Lucille Passy, MD   Assessment & Plan: Visit Diagnoses:  1. Pain of left hand     Plan: Impression is left middle and ring finger pain.  I would like to get an arthritis panel to rule out gout and inflammatory arthritis.  I think this may be early trigger finger.  We did discuss cortisone injection versus oral steroid Dosepak.  Her x-rays are relatively unremarkable but we did discuss possibly an osteoarthritis flareup.  We also discussed Dupuytren's contracture but typically this is painless.  Patient instructed to follow-up if the Dosepak does not give her any significant improvement.  We may have to entertain cortisone injections at that time.  Follow-Up Instructions: Return if symptoms worsen or fail to improve.   Orders:  No orders of the defined types were placed in this encounter.  Meds ordered this encounter  Medications  . predniSONE (STERAPRED UNI-PAK 21 TAB) 10 MG (21) TBPK tablet    Sig: Take as directed    Dispense:  21 tablet    Refill:  0      Procedures: No procedures performed   Clinical Data: No additional findings.   Subjective: Chief Complaint  Patient presents with  . Left Hand - Pain    Nancy Hall is a 47 year old female comes in with 2 to 3 months of left hand pain mainly in the ring and middle finger.  She denies any injuries.  She denies a history of gout or inflammatory arthritis.  She states that there is stiffness and discomfort with finger and hand use.  Denies any numbness and tingling.  Denies any swelling.   Review of Systems  Constitutional: Negative.   HENT: Negative.   Eyes: Negative.   Respiratory: Negative.   Cardiovascular: Negative.   Endocrine: Negative.   Musculoskeletal: Negative.   Neurological:  Negative.   Hematological: Negative.   Psychiatric/Behavioral: Negative.   All other systems reviewed and are negative.    Objective: Vital Signs: LMP 12/19/2014 (Exact Date)   Physical Exam  Constitutional: She is oriented to person, place, and time. She appears well-developed and well-nourished.  HENT:  Head: Normocephalic and atraumatic.  Eyes: EOM are normal.  Neck: Neck supple.  Pulmonary/Chest: Effort normal.  Abdominal: Soft.  Neurological: She is alert and oriented to person, place, and time.  Skin: Skin is warm. Capillary refill takes less than 2 seconds.  Psychiatric: She has a normal mood and affect. Her behavior is normal. Judgment and thought content normal.  Nursing note and vitals reviewed.   Ortho Exam Left hand exam shows tender A1 region of the middle and ring finger.  There is no active triggering.  She is not having skin lesions.  She is able to fully extend her fingers but she has trouble making a full composite fist. Specialty Comments:  No specialty comments available.  Imaging: No results found.   PMFS History: Patient Active Problem List   Diagnosis Date Noted  . Dupuytren's contracture of left hand 12/29/2017  . Atypical mole 12/29/2017  . Sprain of metacarpophalangeal joint of left index finger 11/21/2017  . 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 Ventral hernia 09/08/2017  . Low back pain radiating to left leg 07/29/2017  . Constipation 03/04/2017  . Hot flashes 02/24/2017  . Rash and nonspecific skin eruption 12/19/2016  . Sciatic leg pain 07/10/2016  . Morbid obesity ([place]) 04/05/2014  . Hyperglycemia 09/21/2013  . Dyslipidemia 09/14/2013  . 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 Hypothyroidism 05/30/2011  . 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 MR (mental retardation) 05/30/2011  . Bipolar disorder ([place]) 05/30/2011  . Sleep apnea  05/30/2011  . Seizures ([place])    Past Medical History:  Diagnosis Date  . Anemia 2016  . Anxiety   . Bipolar disorder ([place])   . Cancer ([place])   . Complication of anesthesia   . Depression   . Epigastric hernia   . Esophagitis    LA Class A  . GERD (gastroesophageal reflux disease)   . Hiatal hernia   . HOH (hard of hearing)   . Hypothyroidism   . Learning disability   . Obesity   . PONV (postoperative nausea and vomiting)   . Pseudoseizures   . Seizure ([place]) 2014   last seizure 2(two) years ago  . Seizures ([place])    according to echart- seizures vs pseudoseizures  . Sleep apnea    No cpap  . Thyroid disease   . Ventral hernia     Family History  Problem Relation Age of Onset  . Colon polyps Mother   . Celiac disease Mother   . Colon polyps Father   . Celiac disease Brother   . Diabetes Sister   . Diabetes Paternal Grandmother   . Heart disease Paternal Grandfather   . Colon polyps Paternal Aunt   . Diabetes Maternal Aunt   . Heart disease Maternal Grandfather   . Heart disease Maternal Uncle   . Heart disease Maternal Aunt   . Seizures Neg Hx     Past Surgical History:  Procedure Laterality Date  . ABDOMINAL HYSTERECTOMY N/A 01/09/2015   Procedure: HYSTERECTOMY ABDOMINAL/BILATERAL SALPINGECTOMY;  Surgeon: Rubie Maid, MD;  Location: [facility];  Service: Gynecology;  Laterality: N/A;  . DILATION AND CURETTAGE OF UTERUS    . HYSTEROSCOPY W/D&C N/A 12/19/2014   Procedure: DILATATION AND CURETTAGE /HYSTEROSCOPY;  Surgeon: Rubie Maid, MD;  Location: [facility];  Service: Gynecology;  Laterality: N/A;  . INNER EAR SURGERY Bilateral 1994   poor historian  . INSERTION OF MESH N/A 10/21/2017   Procedure: INSERTION OF MESH;  Surgeon: Donnie Mesa, MD;  Location: [facility];  Service: General;  Laterality: N/A;  . TONSILLECTOMY    . VENTRAL HERNIA REPAIR N/A 10/21/2017   Procedure: OPEN REPAIR EPIGASTIC VENTRAL HERNIA WITH  MESH ERAS PATHWAY;  Surgeon: Donnie Mesa, MD;   Location: [facility];  Service: General;  Laterality: N/A;   Social History   Occupational History  . Not on file  Tobacco Use  . Smoking status: Never Smoker  . Smokeless tobacco: Never Used  Substance and Sexual Activity  . Alcohol use: No    Alcohol/week: 0.0 standard drinks  . Drug use: No  . Sexual activity: Never

## 2018-01-02 LAB — ANA: Anti Nuclear Antibody(ANA): NEGATIVE

## 2018-01-02 LAB — RHEUMATOID FACTOR: Rhuematoid fact SerPl-aCnc: <14 IU/mL (ref <14)

## 2018-01-02 LAB — SEDIMENTATION RATE: Sed Rate: 25 mm/h — ABNORMAL HIGH (ref 0–20)

## 2018-01-02 LAB — URIC ACID: Uric Acid, Serum: 4.9 mg/dL (ref 2.5–7.0)

## 2018-01-05 ENCOUNTER — Ambulatory Visit (INDEPENDENT_AMBULATORY_CARE_PROVIDER_SITE_OTHER): Payer: Medicare Other | Admitting: Psychology

## 2018-01-05 DIAGNOSIS — F3132 Bipolar disorder, current episode depressed, moderate: Secondary | ICD-10-CM

## 2018-01-07 ENCOUNTER — Ambulatory Visit: Payer: Medicare Other | Admitting: Physical Therapy

## 2018-01-20 ENCOUNTER — Encounter: Payer: Self-pay | Admitting: Emergency Medicine

## 2018-01-20 DIAGNOSIS — F312 Bipolar disorder, current episode manic severe with psychotic features: Secondary | ICD-10-CM | POA: Insufficient documentation

## 2018-01-20 NOTE — Progress Notes (Signed)
Subjective:   Nancy Hall is a 47 y.o. female who presents for Medicare Annual (Subsequent) preventive examination.  Pt states her sister drove her.   Review of Systems: No ROS.  Medicare Wellness Visit. Additional risk factors are reflected in the social history. Cardiac Risk Factors include: obesity (BMI >30kg/m2) Sleep patterns:  No issues Home Safety/Smoke Alarms: Feels safe in home. Smoke alarms in place. Lives with mom, sister, niece, and nephew. 3 bedroom apt. 2nd floor.  Female:   Pap- pt states she will talk to mother about it      Mammo- pt states she will talk to mother about it           Dexa scan- n/a     CCS-n/a     Objective:     Vitals: BP 120/76 (BP Location: Left Arm, Patient Position: Sitting, Cuff Size: Large)   Pulse 66   Ht 5\' 6"  (1.676 m)   Wt 286 lb 9.6 oz (130 kg)   LMP 12/19/2014 (Exact Date)   SpO2 97%   BMI 46.26 kg/m   Body mass index is 46.26 kg/m.  Advanced Directives 01/21/2018 10/13/2017 12/19/2016 01/06/2015 12/19/2014 12/07/2014 04/05/2014  Does Patient Have a Medical Advance Directive? No No No No No No No  Would patient like information on creating a medical advance directive? Yes (MAU/Ambulatory/Procedural Areas - Information given) No - Patient declined Yes (MAU/Ambulatory/Procedural Areas - Information given) No - patient declined information No - patient declined information No - patient declined information -    Tobacco Social History   Tobacco Use  Smoking Status Never Smoker  Smokeless Tobacco Never Used     Counseling given: Not Answered   Clinical Intake:     Pain : No/denies pain                 Past Medical History:  Diagnosis Date  . Anemia 2016  . Anxiety   . Bipolar disorder ([place])   . Cancer ([place])   . Complication of anesthesia   . Depression   . Epigastric hernia   . Esophagitis    LA Class A  . GERD (gastroesophageal reflux disease)   . Hiatal hernia   . HOH (hard of hearing)   .  Hypothyroidism   . Learning disability   . Obesity   . PONV (postoperative nausea and vomiting)   . Pseudoseizures   . Seizure ([place]) 2014   last seizure 2(two) years ago  . Seizures ([place])    according to echart- seizures vs pseudoseizures  . Sleep apnea    No cpap  . Thyroid disease   . Ventral hernia    Past Surgical History:  Procedure Laterality Date  . ABDOMINAL HYSTERECTOMY N/A 01/09/2015   Procedure: HYSTERECTOMY ABDOMINAL/BILATERAL SALPINGECTOMY;  Surgeon: Rubie Maid, MD;  Location: [facility];  Service: Gynecology;  Laterality: N/A;  . DILATION AND CURETTAGE OF UTERUS    . HYSTEROSCOPY W/D&C N/A 12/19/2014   Procedure: DILATATION AND CURETTAGE /HYSTEROSCOPY;  Surgeon: Rubie Maid, MD;  Location: [facility];  Service: Gynecology;  Laterality: N/A;  . INNER EAR SURGERY Bilateral 1994   poor historian  . INSERTION OF MESH N/A 10/21/2017   Procedure: INSERTION OF MESH;  Surgeon: Donnie Mesa, MD;  Location: [facility];  Service: General;  Laterality: N/A;  . TONSILLECTOMY    . VENTRAL HERNIA REPAIR N/A 10/21/2017   Procedure: OPEN REPAIR EPIGASTIC VENTRAL HERNIA WITH  MESH ERAS PATHWAY;  Surgeon: Donnie Mesa, MD;  Location:  [facility];  Service: General;  Laterality: N/A;   Family History  Problem Relation Age of Onset  . Colon polyps Mother   . Celiac disease Mother   . Colon polyps Father   . Celiac disease Brother   . Diabetes Sister   . Diabetes Paternal Grandmother   . Heart disease Paternal Grandfather   . Colon polyps Paternal Aunt   . Diabetes Maternal Aunt   . Heart disease Maternal Grandfather   . Heart disease Maternal Uncle   . Heart disease Maternal Aunt   . Seizures Neg Hx    Social History   Socioeconomic History  . Marital status: Single    Spouse name: Not on file  . Number of children: 0  . Years of education: 10  . Highest education level: Not on file  Occupational History  . Not on file  Social Needs  . Financial resource strain: Not on file  .  Food insecurity:    Worry: Not on file    Inability: Not on file  . Transportation needs:    Medical: Not on file    Non-medical: Not on file  Tobacco Use  . Smoking status: Never Smoker  . Smokeless tobacco: Never Used  Substance and Sexual Activity  . Alcohol use: No    Alcohol/week: 0.0 standard drinks  . Drug use: No  . Sexual activity: Not Currently  Lifestyle  . Physical activity:    Days per week: Not on file    Minutes per session: Not on file  . Stress: Not on file  Relationships  . Social connections:    Talks on phone: Not on file    Gets together: Not on file    Attends religious service: Not on file    Active member of club or organization: Not on file    Attends meetings of clubs or organizations: Not on file    Relationship status: Not on file  Other Topics Concern  . Not on file  Social History Narrative   Patient lives at home with mom.    Patient does not have any children.    Patient has a 10th grade education.    Patient is single.    Patient is left handed.    Does not have a living will or HPOA- full code.    Outpatient Encounter Medications as of 01/21/2018  Medication Sig  . gabapentin (NEURONTIN) 300 MG capsule TAKE 1 CAPSULE BY MOUTH EVERYDAY AT BEDTIME  . lamoTRIgine (LAMICTAL) 150 MG tablet Take 1 tablet (150 mg total) by mouth 2 (two) times daily.  Marland Kitchen LATUDA 40 MG TABS tablet Take 40 mg by mouth daily.  Marland Kitchen levothyroxine (SYNTHROID, LEVOTHROID) 137 MCG tablet TAKE 1 TABLET (137 MCG TOTAL) BY MOUTH DAILY BEFORE BREAKFAST.  Marland Kitchen omeprazole (PRILOSEC) 40 MG capsule TAKE 1 CAPSULE (40 MG TOTAL) BY MOUTH DAILY.  Marland Kitchen sertraline (ZOLOFT) 100 MG tablet Take 1 tablet (100 mg total) by mouth daily.  Marland Kitchen topiramate (TOPAMAX) 100 MG tablet One tablet in the morning and 1.5 tablets at night  . oxyCODONE (OXY IR/ROXICODONE) 5 MG immediate release tablet Take 1 tablet (5 mg total) by mouth every 6 (six) hours as needed for severe pain. (Patient not taking: Reported  on 01/21/2018)  . predniSONE (STERAPRED UNI-PAK 21 TAB) 10 MG (21) TBPK tablet Take as directed (Patient not taking: Reported on 01/21/2018)  . [DISCONTINUED] lurasidone (LATUDA) 20 MG TABS tablet Take 20 mg by mouth. 1 po qd x 1wk,  then 40 mg po qd   No facility-administered encounter medications on file as of 01/21/2018.     Activities of Daily Living In your present state of health, do you have any difficulty performing the following activities: 01/21/2018 10/13/2017  Hearing? N N  Vision? N N  Comment wearing glasses. -  Difficulty concentrating or making decisions? N N  Walking or climbing stairs? N Y  Dressing or bathing? N N  Doing errands, shopping? Y N  Preparing Food and eating ? Y -  Using the Toilet? N -  In the past six months, have you accidently leaked urine? N -  Do you have problems with loss of bowel control? N -  Managing your Medications? N -  Managing your Finances? Y -  Housekeeping or managing your Housekeeping? N -  Some recent data might be hidden    Patient Care Team: Lucille Passy, MD as PCP - General (Family Medicine) Philemon Kingdom, MD as Consulting Physician (Internal Medicine) Rubie Maid, MD as Referring Physician (Obstetrics and Gynecology) Dennie Bible, NP as Nurse Practitioner (Family Medicine) Ladene Artist, MD as Consulting Physician (Gastroenterology)    Assessment:   This is a routine wellness examination for [place]. Physical assessment deferred to PCP.  Exercise Activities and Dietary recommendations Current Exercise Habits: The patient does not participate in regular exercise at present, Exercise limited by: None identified Diet (meal preparation, eat out, water intake, caffeinated beverages, dairy products, fruits and vegetables): 24 hr recall Breakfast: cereal or eggs Lunch: hot pocket Dinner:  Fish sandwich  Goals    . Increase physical activity     Starting 12/24/2016, I will attempt to walk at least 15 min  daily as weather permits.        Fall Risk Fall Risk  01/21/2018 12/19/2016 06/27/2015 09/21/2013  Falls in the past year? No No No No     Depression Screen PHQ 2/9 Scores 01/21/2018 12/19/2016 06/27/2015 09/21/2013  PHQ - 2 Score 1 2 2 6   PHQ- 9 Score - 8 - 14     Cognitive Function MMSE - Mini Mental State Exam 01/21/2018 12/19/2016  Orientation to time 5 5  Orientation to Place 5 5  Registration 3 3  Attention/ Calculation 5 0  Recall 3 3  Language- name 2 objects 2 0  Language- repeat 0 1  Language- follow 3 step command 3 2  Language- follow 3 step command-comments - unable to follow 1 step of 3 step command  Language- read & follow direction 1 0  Write a sentence 1 0  Copy design 1 0  Total score 29 19        Immunization History  Administered Date(s) Administered  . Influenza, Seasonal, Injecte, Preservative Fre 04/08/2012  . Influenza,inj,Quad PF,6+ Mos 12/31/2012, 04/24/2015, 01/01/2016, 12/26/2016, 12/29/2017    Screening Tests Health Maintenance  Topic Date Due  . TETANUS/TDAP  12/20/2026 (Originally 10/08/1991)  . INFLUENZA VACCINE  Completed  . HIV Screening  Discontinued       Plan:    Please schedule your next medicare wellness visit with me in 1 yr.  Continue to eat heart healthy diet (full of fruits, vegetables, whole grains, lean protein, water--limit salt, fat, and sugar intake) and increase physical activity as tolerated.   I have personally reviewed and noted the following in the patient's chart:   . Medical and social history . Use of alcohol, tobacco or illicit drugs  . Current medications and supplements . Functional ability  and status . Nutritional status . Physical activity . Advanced directives . List of other physicians . Hospitalizations, surgeries, and ER visits in previous 12 months . Vitals . Screenings to include cognitive, depression, and falls . Referrals and appointments  In addition, I have reviewed and discussed with  patient certain preventive protocols, quality metrics, and best practice recommendations. A written personalized care plan for preventive services as well as general preventive health recommendations were provided to patient.     Shela Nevin, [place]  01/21/2018

## 2018-01-21 ENCOUNTER — Ambulatory Visit (INDEPENDENT_AMBULATORY_CARE_PROVIDER_SITE_OTHER): Payer: Medicare Other | Admitting: *Deleted

## 2018-01-21 ENCOUNTER — Encounter: Payer: Self-pay | Admitting: *Deleted

## 2018-01-21 VITALS — BP 120/76 | HR 66 | Ht 66.0 in | Wt 286.6 lb

## 2018-01-21 DIAGNOSIS — Z0000 Encounter for general adult medical examination without abnormal findings: Secondary | ICD-10-CM

## 2018-01-21 NOTE — Patient Instructions (Signed)
Please schedule your next medicare wellness visit with me in 1 yr.  Continue to eat heart healthy diet (full of fruits, vegetables, whole grains, lean protein, water--limit salt, fat, and sugar intake) and increase physical activity as tolerated.   Nancy Hall , Thank you for taking time to come for your Medicare Wellness Visit. I appreciate your ongoing commitment to your health goals. Please review the following plan we discussed and let me know if I can assist you in the future.   These are the goals we discussed: Goals    . Increase physical activity     Starting 12/24/2016, I will attempt to walk at least 15 min daily as weather permits.        This is a list of the screening recommended for you and due dates:  Health Maintenance  Topic Date Due  . Tetanus Vaccine  12/20/2026*  . Flu Shot  Completed  . HIV Screening  Discontinued  *Topic was postponed. The date shown is not the original due date.    Health Maintenance, Female Adopting a healthy lifestyle and getting preventive care can go a long way to promote health and wellness. Talk with your health care provider about what schedule of regular examinations is right for you. This is a good chance for you to check in with your provider about disease prevention and staying healthy. In between checkups, there are plenty of things you can do on your own. Experts have done a lot of research about which lifestyle changes and preventive measures are most likely to keep you healthy. Ask your health care provider for more information. Weight and diet Eat a healthy diet  Be sure to include plenty of vegetables, fruits, low-fat dairy products, and lean protein.  Do not eat a lot of foods high in solid fats, added sugars, or salt.  Get regular exercise. This is one of the most important things you can do for your health. ? Most adults should exercise for at least 150 minutes each week. The exercise should increase your heart rate and make  you sweat (moderate-intensity exercise). ? Most adults should also do strengthening exercises at least twice a week. This is in addition to the moderate-intensity exercise.  Maintain a healthy weight  Body mass index (BMI) is a measurement that can be used to identify possible weight problems. It estimates body fat based on height and weight. Your health care provider can help determine your BMI and help you achieve or maintain a healthy weight.  For females 3 years of age and older: ? A BMI below 18.5 is considered underweight. ? A BMI of 18.5 to 24.9 is normal. ? A BMI of 25 to 29.9 is considered overweight. ? A BMI of 30 and above is considered obese.  Watch levels of cholesterol and blood lipids  You should start having your blood tested for lipids and cholesterol at 45 years of age, then have this test every 5 years.  You may need to have your cholesterol levels checked more often if: ? Your lipid or cholesterol levels are high. ? You are older than 45 years of age. ? You are at high risk for heart disease.  Cancer screening Lung Cancer  Lung cancer screening is recommended for adults 27-31 years old who are at high risk for lung cancer because of a history of smoking.  A yearly low-dose CT scan of the lungs is recommended for people who: ? Currently smoke. ? Have quit within the  past 15 years. ? Have at least a 30-pack-year history of smoking. A pack year is smoking an average of one pack of cigarettes a day for 1 year.  Yearly screening should continue until it has been 15 years since you quit.  Yearly screening should stop if you develop a health problem that would prevent you from having lung cancer treatment.  Breast Cancer  Practice breast self-awareness. This means understanding how your breasts normally appear and feel.  It also means doing regular breast self-exams. Let your health care provider know about any changes, no matter how small.  If you are in your  20s or 30s, you should have a clinical breast exam (CBE) by a health care provider every 1-3 years as part of a regular health exam.  If you are 50 or older, have a CBE every year. Also consider having a breast X-ray (mammogram) every year.  If you have a family history of breast cancer, talk to your health care provider about genetic screening.  If you are at high risk for breast cancer, talk to your health care provider about having an MRI and a mammogram every year.  Breast cancer gene (BRCA) assessment is recommended for women who have family members with BRCA-related cancers. BRCA-related cancers include: ? Breast. ? Ovarian. ? Tubal. ? Peritoneal cancers.  Results of the assessment will determine the need for genetic counseling and BRCA1 and BRCA2 testing.  Cervical Cancer Your health care provider may recommend that you be screened regularly for cancer of the pelvic organs (ovaries, uterus, and vagina). This screening involves a pelvic examination, including checking for microscopic changes to the surface of your cervix (Pap test). You may be encouraged to have this screening done every 3 years, beginning at age 55.  For women ages 75-65, health care providers may recommend pelvic exams and Pap testing every 3 years, or they may recommend the Pap and pelvic exam, combined with testing for human papilloma virus (HPV), every 5 years. Some types of HPV increase your risk of cervical cancer. Testing for HPV may also be done on women of any age with unclear Pap test results.  Other health care providers may not recommend any screening for nonpregnant women who are considered low risk for pelvic cancer and who do not have symptoms. Ask your health care provider if a screening pelvic exam is right for you.  If you have had past treatment for cervical cancer or a condition that could lead to cancer, you need Pap tests and screening for cancer for at least 20 years after your treatment. If Pap  tests have been discontinued, your risk factors (such as having a new sexual partner) need to be reassessed to determine if screening should resume. Some women have medical problems that increase the chance of getting cervical cancer. In these cases, your health care provider may recommend more frequent screening and Pap tests.  Colorectal Cancer  This type of cancer can be detected and often prevented.  Routine colorectal cancer screening usually begins at 45 years of age and continues through 45 years of age.  Your health care provider may recommend screening at an earlier age if you have risk factors for colon cancer.  Your health care provider may also recommend using home test kits to check for hidden blood in the stool.  A small camera at the end of a tube can be used to examine your colon directly (sigmoidoscopy or colonoscopy). This is done to check for the  earliest forms of colorectal cancer.  Routine screening usually begins at age 37.  Direct examination of the colon should be repeated every 5-10 years through 45 years of age. However, you may need to be screened more often if early forms of precancerous polyps or small growths are found.  Skin Cancer  Check your skin from head to toe regularly.  Tell your health care provider about any new moles or changes in moles, especially if there is a change in a mole's shape or color.  Also tell your health care provider if you have a mole that is larger than the size of a pencil eraser.  Always use sunscreen. Apply sunscreen liberally and repeatedly throughout the day.  Protect yourself by wearing long sleeves, pants, a wide-brimmed hat, and sunglasses whenever you are outside.  Heart disease, diabetes, and high blood pressure  High blood pressure causes heart disease and increases the risk of stroke. High blood pressure is more likely to develop in: ? People who have blood pressure in the high end of the normal range  (130-139/85-89 mm Hg). ? People who are overweight or obese. ? People who are African American.  If you are 81-72 years of age, have your blood pressure checked every 3-5 years. If you are 80 years of age or older, have your blood pressure checked every year. You should have your blood pressure measured twice-once when you are at a hospital or clinic, and once when you are not at a hospital or clinic. Record the average of the two measurements. To check your blood pressure when you are not at a hospital or clinic, you can use: ? An automated blood pressure machine at a pharmacy. ? A home blood pressure monitor.  If you are between 82 years and 39 years old, ask your health care provider if you should take aspirin to prevent strokes.  Have regular diabetes screenings. This involves taking a blood sample to check your fasting blood sugar level. ? If you are at a normal weight and have a low risk for diabetes, have this test once every three years after 45 years of age. ? If you are overweight and have a high risk for diabetes, consider being tested at a younger age or more often. Preventing infection Hepatitis B  If you have a higher risk for hepatitis B, you should be screened for this virus. You are considered at high risk for hepatitis B if: ? You were born in a country where hepatitis B is common. Ask your health care provider which countries are considered high risk. ? Your parents were born in a high-risk country, and you have not been immunized against hepatitis B (hepatitis B vaccine). ? You have HIV or AIDS. ? You use needles to inject street drugs. ? You live with someone who has hepatitis B. ? You have had sex with someone who has hepatitis B. ? You get hemodialysis treatment. ? You take certain medicines for conditions, including cancer, organ transplantation, and autoimmune conditions.  Hepatitis C  Blood testing is recommended for: ? Everyone born from [birth year] through  1963. ? Anyone with known risk factors for hepatitis C.  Sexually transmitted infections (STIs)  You should be screened for sexually transmitted infections (STIs) including gonorrhea and chlamydia if: ? You are sexually active and are younger than 45 years of age. ? You are older than 45 years of age and your health care provider tells you that you are at risk for this type  of infection. ? Your sexual activity has changed since you were last screened and you are at an increased risk for chlamydia or gonorrhea. Ask your health care provider if you are at risk.  If you do not have HIV, but are at risk, it may be recommended that you take a prescription medicine daily to prevent HIV infection. This is called pre-exposure prophylaxis (PrEP). You are considered at risk if: ? You are sexually active and do not regularly use condoms or know the HIV status of your partner(s). ? You take drugs by injection. ? You are sexually active with a partner who has HIV.  Talk with your health care provider about whether you are at high risk of being infected with HIV. If you choose to begin PrEP, you should first be tested for HIV. You should then be tested every 3 months for as long as you are taking PrEP. Pregnancy  If you are premenopausal and you may become pregnant, ask your health care provider about preconception counseling.  If you may become pregnant, take 400 to 800 micrograms (mcg) of folic acid every day.  If you want to prevent pregnancy, talk to your health care provider about birth control (contraception). Osteoporosis and menopause  Osteoporosis is a disease in which the bones lose minerals and strength with aging. This can result in serious bone fractures. Your risk for osteoporosis can be identified using a bone density scan.  If you are 74 years of age or older, or if you are at risk for osteoporosis and fractures, ask your health care provider if you should be screened.  Ask your health  care provider whether you should take a calcium or vitamin D supplement to lower your risk for osteoporosis.  Menopause may have certain physical symptoms and risks.  Hormone replacement therapy may reduce some of these symptoms and risks. Talk to your health care provider about whether hormone replacement therapy is right for you. Follow these instructions at home:  Schedule regular health, dental, and eye exams.  Stay current with your immunizations.  Do not use any tobacco products including cigarettes, chewing tobacco, or electronic cigarettes.  If you are pregnant, do not drink alcohol.  If you are breastfeeding, limit how much and how often you drink alcohol.  Limit alcohol intake to no more than 1 drink per day for nonpregnant women. One drink equals 12 ounces of beer, 5 ounces of wine, or 1 ounces of hard liquor.  Do not use street drugs.  Do not share needles.  Ask your health care provider for help if you need support or information about quitting drugs.  Tell your health care provider if you often feel depressed.  Tell your health care provider if you have ever been abused or do not feel safe at home. This information is not intended to replace advice given to you by your health care provider. Make sure you discuss any questions you have with your health care provider. Document Released: 10/01/2010 Document Revised: 08/24/2015 Document Reviewed: 12/20/2014 [employer] Interactive Patient Education  [employer].

## 2018-01-21 NOTE — Progress Notes (Signed)
I reviewed health advisor's note, was available for consultation, and agree with documentation and plan.  

## 2018-01-26 ENCOUNTER — Other Ambulatory Visit: Payer: Self-pay | Admitting: Family Medicine

## 2018-01-26 DIAGNOSIS — Z1231 Encounter for screening mammogram for malignant neoplasm of breast: Secondary | ICD-10-CM

## 2018-01-30 ENCOUNTER — Encounter: Payer: Self-pay | Admitting: Psychiatry

## 2018-01-30 ENCOUNTER — Ambulatory Visit (INDEPENDENT_AMBULATORY_CARE_PROVIDER_SITE_OTHER): Payer: Medicare Other | Admitting: Psychiatry

## 2018-01-30 VITALS — Wt 286.0 lb

## 2018-01-30 DIAGNOSIS — F319 Bipolar disorder, unspecified: Secondary | ICD-10-CM

## 2018-01-30 DIAGNOSIS — F418 Other specified anxiety disorders: Secondary | ICD-10-CM | POA: Diagnosis not present

## 2018-01-30 DIAGNOSIS — F5101 Primary insomnia: Secondary | ICD-10-CM | POA: Diagnosis not present

## 2018-01-30 MED ORDER — SERTRALINE HCL 100 MG PO TABS: 100.0000 mg | ORAL_TABLET | Freq: Every day | ORAL | 0 refills | Status: DC

## 2018-01-30 MED ORDER — LURASIDONE HCL 60 MG PO TABS: 60.0000 mg | ORAL_TABLET | Freq: Every evening | ORAL | 1 refills | Status: DC

## 2018-01-30 NOTE — Progress Notes (Signed)
Nancy Hall [id number] 1970-11-25 47 y.o.  Subjective:   Patient ID:  Nancy Hall is a 47 y.o. (DOB 09-23-1970) female.  Chief Complaint: No chief complaint on file.   HPI Nancy Hall presents to the office today for follow-up of mood. She is accompanied by her mother. Her mother reports that the only mood issue is between pt and and pt's nephew. Pt reports that nephew will call her names and this is upsetting to her. Mother reports that pt has not been wanting to do as much as she used to around the house, such as cleaning. Pt's mother reports that pt frequently tells her she will do it "when she feels like it." Mother notices this has been happening for the last 8 weeks or so. Mother reports that pt is singing in the [place] at church and will go to practice but has been less interested in going to church. Mother reports that pt has been more withdrawn socially. Mother reports that pt has not followed up on many of therapist's recommendations. Mother reports that pt has c/o low energy. Mother reports that pt's father died 5 years ago in Feb 23, 2023 and questions if this may be affecting her mood. Pt reports that mother being away for almost 4 weeks was difficult for her.  She denies depressed mood. Pt reports that her motivation may be a little low. Reports occasional difficulty falling asleep. Has had some early morning awakenings. Sleep has also disrupted by pain. They deny any significant worsening in sleep. Pt reports that she has been frequently wanting sweets. Mother reports that pt has certain times that she has to eat certain meals at certain times and will get upset if this schedule is thrown off. Mother reports that if things are half empty she has to get another one to avoid running out. They report that she has lost some weight. They describe some possible difficulty with concentration. Denies SI.  Denies paranoia.   Pt's mother was away for about a month to help her sister and  her husband that had acute health issues and died.   Past Medication Trials: Abilify- tremor and drug-induced parkinsonism Latuda Lamictal-prescribed by her neurologist for seizures.  Has taken long-term and is unsure the effect it has had on mood. Depakote-prescribed for seizures in the past Topamax-has taken long-term and was started for seizures.  Mother reports that patient initially lost weight when she started on Topamax. Sertraline  Review of Systems:  Review of Systems  Musculoskeletal:       She reports that she has had swelling and redness for 2-3 months in left hand. Has had difficulty gripping things. She reports that it has been painful and waking her up at night. Saw hand specialist and was started on prednisone.   Neurological:       Involuntary mouth movements resolved after stopping Abilify. They report that hand tremor has improved.     Medications: I have reviewed the patient's current medications.  Current Outpatient Medications  Medication Sig Dispense Refill  . acetaminophen (TYLENOL) 325 MG tablet Take 650 mg by mouth every 6 (six) hours as needed.    . gabapentin (NEURONTIN) 300 MG capsule TAKE 1 CAPSULE BY MOUTH EVERYDAY AT BEDTIME 90 capsule 1  . lamoTRIgine (LAMICTAL) 150 MG tablet Take 1 tablet (150 mg total) by mouth 2 (two) times daily. 180 tablet 3  . levothyroxine (SYNTHROID, LEVOTHROID) 137 MCG tablet TAKE 1 TABLET (137 MCG TOTAL) BY MOUTH DAILY BEFORE BREAKFAST.  60 tablet 3  . omeprazole (PRILOSEC) 40 MG capsule TAKE 1 CAPSULE (40 MG TOTAL) BY MOUTH DAILY. 90 capsule 0  . sertraline (ZOLOFT) 100 MG tablet Take 1 tablet (100 mg total) by mouth daily. 90 tablet 0  . topiramate (TOPAMAX) 100 MG tablet One tablet in the morning and 1.5 tablets at night 225 tablet 3  . Lurasidone HCl (LATUDA) 60 MG TABS Take 1 tablet (60 mg total) by mouth every evening. 30 tablet 1  . oxyCODONE (OXY IR/ROXICODONE) 5 MG immediate release tablet Take 1 tablet (5 mg total) by  mouth every 6 (six) hours as needed for severe pain. (Patient not taking: Reported on 01/21/2018) 20 tablet 0  . predniSONE (STERAPRED UNI-PAK 21 TAB) 10 MG (21) TBPK tablet Take as directed (Patient not taking: Reported on 01/21/2018) 21 tablet 0   No current facility-administered medications for this visit.     Medication Side Effects: None  Allergies:  Allergies  Allergen Reactions  . Penicillins Rash    Has patient had a PCN reaction causing immediate rash, facial/tongue/throat swelling, SOB or lightheadedness with hypotension: Yes Has patient had a PCN reaction causing severe rash involving mucus membranes or skin necrosis: No Has patient had a PCN reaction that required hospitalization: No Has patient had a PCN reaction occurring within the last 10 years: Yes If all of the above answers are "NO", then may proceed with Cephalosporin use.  3g Ancef administered 10/21/2017 without reaction/complic    Past Medical History:  Diagnosis Date  . Anemia 2016  . Anxiety   . Bipolar disorder ([place])   . Cancer ([place])   . Complication of anesthesia   . Depression   . Epigastric hernia   . Esophagitis    LA Class A  . GERD (gastroesophageal reflux disease)   . Hiatal hernia   . HOH (hard of hearing)   . Hypothyroidism   . Learning disability   . Obesity   . PONV (postoperative nausea and vomiting)   . Pseudoseizures   . Seizure ([place]) 2014   last seizure 2(two) years ago  . Seizures ([place])    according to echart- seizures vs pseudoseizures  . Sleep apnea    No cpap  . Thyroid disease   . Ventral hernia     Family History  Problem Relation Age of Onset  . Colon polyps Mother   . Celiac disease Mother   . Depression Mother   . Anxiety disorder Mother   . Colon polyps Father   . Celiac disease Brother   . Diabetes Sister   . Depression Sister   . Depression Brother   . CVA Brother   . Diabetes Paternal Grandmother   . Heart disease Paternal Grandfather   . Colon  polyps Paternal Aunt   . Diabetes Maternal Aunt   . Heart disease Maternal Grandfather   . Heart disease Maternal Uncle   . Heart disease Maternal Aunt   . Seizures Neg Hx     Social History   Socioeconomic History  . Marital status: Single    Spouse name: Not on file  . Number of children: 0  . Years of education: 10  . Highest education level: Not on file  Occupational History  . Not on file  Social Needs  . Financial resource strain: Not on file  . Food insecurity:    Worry: Not on file    Inability: Not on file  . Transportation needs:    Medical: Not on  file    Non-medical: Not on file  Tobacco Use  . Smoking status: Never Smoker  . Smokeless tobacco: Never Used  Substance and Sexual Activity  . Alcohol use: No    Alcohol/week: 0.0 standard drinks  . Drug use: No  . Sexual activity: Not Currently  Lifestyle  . Physical activity:    Days per week: Not on file    Minutes per session: Not on file  . Stress: Not on file  Relationships  . Social connections:    Talks on phone: Not on file    Gets together: Not on file    Attends religious service: Not on file    Active member of club or organization: Not on file    Attends meetings of clubs or organizations: Not on file    Relationship status: Not on file  . Intimate partner violence:    Fear of current or ex partner: Not on file    Emotionally abused: Not on file    Physically abused: Not on file    Forced sexual activity: Not on file  Other Topics Concern  . Not on file  Social History Narrative   Patient lives at home with mom.    Patient does not have any children.    Patient has a 10th grade education.    Patient is single.    Patient is left handed.    Does not have a living will or HPOA- full code.    Past Medical History, Surgical history, Social history, and Family history were reviewed and updated as appropriate.   Please see review of systems for further details on the patient's review from  today.   Objective:   Physical Exam:  Wt 286 lb (129.7 kg)   LMP 12/19/2014 (Exact Date)   BMI 46.16 kg/m   Physical Exam  Musculoskeletal:       Left wrist: She exhibits tenderness and swelling.  Psychiatric: Judgment and thought content normal. Her mood appears anxious. Her speech is delayed. She is slowed and withdrawn. Cognition and memory are impaired. She exhibits a depressed mood. She expresses no homicidal and no suicidal ideation. She expresses no suicidal plans and no homicidal plans.  Affect is constricted. Decreased speech quantity. Minimal spontaneous interaction. No delusions or hallucinations noted.  Insight is fair to poor.     Lab Review:     Component Value Date/Time   NA 139 10/13/2017 0901   K 3.8 10/13/2017 0901   CL 110 10/13/2017 0901   CO2 20 (L) 10/13/2017 0901   GLUCOSE 133 (H) 10/13/2017 0901   BUN 10 10/13/2017 0901   CREATININE 0.98 10/13/2017 0901   CALCIUM 8.5 (L) 10/13/2017 0901   PROT 6.4 07/29/2017 1220   ALBUMIN 3.9 07/29/2017 1220   AST 14 07/29/2017 1220   ALT 16 07/29/2017 1220   ALKPHOS 76 07/29/2017 1220   BILITOT 0.4 07/29/2017 1220   GFRNONAA >60 10/13/2017 0901   GFRAA >60 10/13/2017 0901       Component Value Date/Time   WBC 6.0 10/13/2017 0901   RBC 3.89 10/13/2017 0901   HGB 11.6 (L) 10/13/2017 0901   HGB 12.3 11/03/2014 1100   HCT 37.6 10/13/2017 0901   HCT 35.7 11/03/2014 1100   PLT 176 10/13/2017 0901   MCV 96.7 10/13/2017 0901   MCH 29.8 10/13/2017 0901   MCHC 30.9 10/13/2017 0901   RDW 14.3 10/13/2017 0901   LYMPHSABS 1.4 12/19/2016 1400   MONOABS 0.3 12/19/2016 1400  EOSABS 0.5 12/19/2016 1400   BASOSABS 0.0 12/19/2016 1400    No results found for: POCLITH, LITHIUM   No results found for: PHENYTOIN, PHENOBARB, VALPROATE, CBMZ   .res Assessment: Plan:   Patient seen for 30 minutes and greater than 50% of visit spent counseling patient and her mother about possible treatment options.  Discussed with  patient that she seems to be having some depressive signs and symptoms based on what she and her mother have reported and change in treatment may be indicated.  Discussed option of either increasing sertraline or Latuda.  Discussed that increasing sertraline may help with anxiety and depression, however increases in antidepressants can possibly precipitate manic signs symptoms.  Discussed that increase in [place] may be helpful for mood signs and symptoms and possibly anxiety, however higher doses of atypical antipsychotics have increased risk of tardive dyskinesia and this is a concern since she experienced involuntary movements in the past with Abilify.  Patient and her mother indicate they would prefer to try increased dose of Latuda.  Will increase Latuda to 60 mg daily with food.  Recommend changing administration time of Latuda to evening in case Latuda causes some drowsiness and taking in the evening could potentially improve insomnia.  Advised patient and her mother to contact office immediately if she develops any involuntary movements and Latuda could be decreased to current dose of 40 mg daily.  Patient and her mother verbalized understanding.  Patient to follow-up with this provider in 1 to 2 months or sooner if clinically indicated. Bipolar depression ([place]) - Plan: Lurasidone HCl (LATUDA) 60 MG TABS, sertraline (ZOLOFT) 100 MG tablet  Other specified anxiety disorders - Plan: sertraline (ZOLOFT) 100 MG tablet  Primary insomnia  Please see After Visit Summary for patient specific instructions.  Future Appointments  Date Time Provider [place]  02/16/2018  3:00 PM Cottle, Lucious Groves, LCSW [facility] None  02/24/2018 10:00 AM Philemon Kingdom, MD [facility] None  03/09/2018 11:50 AM [facility] MM 2 [facility]  03/13/2018 10:00 AM Thayer Headings, PMHNP [facility] None  03/30/2018 10:30 AM Lucille Passy, MD [facility]  12/21/2018 10:30 AM Ward Givens, NP [facility] None  01/27/2019  11:00 AM Kathlen Brunswick, RN [facility]    No orders of the defined types were placed in this encounter.     -------------------------------

## 2018-02-10 ENCOUNTER — Encounter (INDEPENDENT_AMBULATORY_CARE_PROVIDER_SITE_OTHER): Payer: Self-pay | Admitting: Orthopaedic Surgery

## 2018-02-11 ENCOUNTER — Telehealth: Payer: Self-pay | Admitting: Psychiatry

## 2018-02-11 NOTE — Telephone Encounter (Signed)
Mom Nancy Hall has medication questions. #[phone]

## 2018-02-12 NOTE — Telephone Encounter (Signed)
Please advise them to try giving Latuda at different times to determine if this makes a difference with sleep schedule. Would like to wait another week to give it enough time and call back if mood still seems depressed.

## 2018-02-12 NOTE — Telephone Encounter (Signed)
Left voice mail to call back 

## 2018-02-16 ENCOUNTER — Ambulatory Visit (INDEPENDENT_AMBULATORY_CARE_PROVIDER_SITE_OTHER): Payer: Medicare Other | Admitting: Psychology

## 2018-02-16 ENCOUNTER — Telehealth: Payer: Self-pay | Admitting: Psychiatry

## 2018-02-16 DIAGNOSIS — F319 Bipolar disorder, unspecified: Secondary | ICD-10-CM

## 2018-02-16 DIAGNOSIS — F3132 Bipolar disorder, current episode depressed, moderate: Secondary | ICD-10-CM

## 2018-02-16 MED ORDER — LURASIDONE HCL 40 MG PO TABS: 40.0000 mg | ORAL_TABLET | Freq: Every day | ORAL | 0 refills | Status: DC

## 2018-02-16 NOTE — Telephone Encounter (Signed)
Notified by pt's therapist that pt is experiencing s/s of worsening depression. Please contact pt's mother and advise that they decrease Latuda to previous dose of 40 mg po qd. Will send in script. Also recommend moving up apt to discuss other possible options.

## 2018-02-18 ENCOUNTER — Telehealth: Payer: Self-pay

## 2018-02-18 NOTE — Telephone Encounter (Signed)
See prior note

## 2018-02-18 NOTE — Telephone Encounter (Signed)
Addressed call with new information on 02/18/18.Mother acknowledged Rx and appt information.

## 2018-02-20 ENCOUNTER — Encounter: Payer: Self-pay | Admitting: Psychiatry

## 2018-02-20 ENCOUNTER — Ambulatory Visit (INDEPENDENT_AMBULATORY_CARE_PROVIDER_SITE_OTHER): Payer: Medicare Other | Admitting: Psychiatry

## 2018-02-20 DIAGNOSIS — F315 Bipolar disorder, current episode depressed, severe, with psychotic features: Secondary | ICD-10-CM | POA: Diagnosis not present

## 2018-02-20 MED ORDER — CARIPRAZINE HCL 3 MG PO CAPS: 3.0000 mg | ORAL_CAPSULE | Freq: Every day | ORAL | 0 refills | Status: DC

## 2018-02-20 NOTE — Progress Notes (Signed)
Nancy Hall [id number] 1971/02/05 47 y.o.  Subjective:   Patient ID:  Nancy Hall is a 47 y.o. (DOB 10/09/1970) female.  Chief Complaint:  Chief Complaint  Patient presents with  . Depression  . Other    Irritability, possible paranoia  . Anxiety    HPI Nancy Hall presents to the office today for emergent visit due to worsening mood, anxiety, and possible psychosis/paranoia.  Patient's mother reports that pt's mood and insomnia worsened with increase in Latuda to 60 mg po qd. Mother reports that patient's mood has been depressed and irritable. Mother reports that pt will become verbally agitated when someone does not do what she wants immediately. Mother reports that pt will say that nephew is "looking at her funny" and that family has not observed this. Mother reports that pt believes that nephew is against her. Mother reports that pt does not want to leave the house and is no longer interested in going shopping. Mother reports that she is going to her room at 6 pm after dinner instead of spending time with family and then going into her room around 8 pm. Mother reports that pt will come out into the living room and sleep in the recliner after everyone has gone to bed. Mother reports that pt has been eating healthier foods. Motivation has been lower and said she had to make herself go to choir practice and said she would not go if she was not in the cantata. Energy has been low and pt's mother reports that pt frequently c/o feeling tired. She is unsure if concentration has changed. Denies SI. Denies AH or VH.   Pt reports that she "sometimes" feels sad. Reports that she "sometimes" feels irritable." Pt reports that she "sometimes' feels anxious. Pt' s mother reports that pt tends to worry about things before they happen and that may not happen. Also has come obsessions and compulsions.   Mother reports that pt has been c/o itching without any rash evident.   Mother reports that pt  had lip twitch and hand tremor on Abilify and that this resolved when Abilify was changed to [place].   Past medication trials: Abilify-helpful for mood signs and symptoms.  Had tremor and involuntary orofacial movements.  Possible weight gain. Latuda- ineffective for mood signs and symptoms.  No tolerability issues. Sertraline-has taken long-term.  May have been helpful for depression. Lamictal-prescribed for seizures by neurologist.  Patient has taken long-term and effect on mood is unclear. Topamax- taken long-term and was started for seizures. Depakote-prescribed for seizures in the past.  Review of Systems:  Review of Systems  Musculoskeletal: Negative for gait problem.       Pain in hand  Neurological: Negative for tremors.  Psychiatric/Behavioral:       Please refer to HPI    Medications: I have reviewed the patient's current medications.  Current Outpatient Medications  Medication Sig Dispense Refill  . acetaminophen (TYLENOL) 325 MG tablet Take 650 mg by mouth every 6 (six) hours as needed.    . gabapentin (NEURONTIN) 300 MG capsule TAKE 1 CAPSULE BY MOUTH EVERYDAY AT BEDTIME 90 capsule 1  . lamoTRIgine (LAMICTAL) 150 MG tablet Take 1 tablet (150 mg total) by mouth 2 (two) times daily. 180 tablet 3  . levothyroxine (SYNTHROID, LEVOTHROID) 137 MCG tablet TAKE 1 TABLET (137 MCG TOTAL) BY MOUTH DAILY BEFORE BREAKFAST. 60 tablet 3  . lurasidone (LATUDA) 40 MG TABS tablet Take 1 tablet (40 mg total) by mouth daily  with supper. 30 tablet 0  . omeprazole (PRILOSEC) 40 MG capsule TAKE 1 CAPSULE (40 MG TOTAL) BY MOUTH DAILY. 90 capsule 0  . sertraline (ZOLOFT) 100 MG tablet Take 1 tablet (100 mg total) by mouth daily. 90 tablet 0  . topiramate (TOPAMAX) 100 MG tablet One tablet in the morning and 1.5 tablets at night 225 tablet 3  . cariprazine (VRAYLAR) capsule Take 1 capsule (3 mg total) by mouth daily. Samples given 35 capsule 0  . Lurasidone HCl (LATUDA) 60 MG TABS Take 1 tablet  (60 mg total) by mouth every evening. (Patient not taking: Reported on 02/20/2018) 30 tablet 1  . oxyCODONE (OXY IR/ROXICODONE) 5 MG immediate release tablet Take 1 tablet (5 mg total) by mouth every 6 (six) hours as needed for severe pain. (Patient not taking: Reported on 01/21/2018) 20 tablet 0  . predniSONE (STERAPRED UNI-PAK 21 TAB) 10 MG (21) TBPK tablet Take as directed (Patient not taking: Reported on 01/21/2018) 21 tablet 0   No current facility-administered medications for this visit.     Medication Side Effects: None  Allergies:  Allergies  Allergen Reactions  . Penicillins Rash    Has patient had a PCN reaction causing immediate rash, facial/tongue/throat swelling, SOB or lightheadedness with hypotension: Yes Has patient had a PCN reaction causing severe rash involving mucus membranes or skin necrosis: No Has patient had a PCN reaction that required hospitalization: No Has patient had a PCN reaction occurring within the last 10 years: Yes If all of the above answers are "NO", then may proceed with Cephalosporin use.  3g Ancef administered 10/21/2017 without reaction/complic    Past Medical History:  Diagnosis Date  . Anemia 2016  . Anxiety   . Bipolar disorder ([place])   . Cancer ([place])   . Complication of anesthesia   . Depression   . Epigastric hernia   . Esophagitis    LA Class A  . GERD (gastroesophageal reflux disease)   . Hiatal hernia   . HOH (hard of hearing)   . Hypothyroidism   . Learning disability   . Obesity   . PONV (postoperative nausea and vomiting)   . Pseudoseizures   . Seizure ([place]) 2014   last seizure 2(two) years ago  . Seizures ([place])    according to echart- seizures vs pseudoseizures  . Sleep apnea    No cpap  . Thyroid disease   . Ventral hernia     Family History  Problem Relation Age of Onset  . Colon polyps Mother   . Celiac disease Mother   . Depression Mother   . Anxiety disorder Mother   . Colon polyps Father   . Celiac  disease Brother   . Diabetes Sister   . Depression Sister   . Depression Brother   . CVA Brother   . Diabetes Paternal Grandmother   . Heart disease Paternal Grandfather   . Colon polyps Paternal Aunt   . Diabetes Maternal Aunt   . Heart disease Maternal Grandfather   . Heart disease Maternal Uncle   . Heart disease Maternal Aunt   . Seizures Neg Hx     Social History   Socioeconomic History  . Marital status: Single    Spouse name: Not on file  . Number of children: 0  . Years of education: 10  . Highest education level: Not on file  Occupational History  . Not on file  Social Needs  . Financial resource strain: Not on file  .  Food insecurity:    Worry: Not on file    Inability: Not on file  . Transportation needs:    Medical: Not on file    Non-medical: Not on file  Tobacco Use  . Smoking status: Never Smoker  . Smokeless tobacco: Never Used  Substance and Sexual Activity  . Alcohol use: No    Alcohol/week: 0.0 standard drinks  . Drug use: No  . Sexual activity: Not Currently  Lifestyle  . Physical activity:    Days per week: Not on file    Minutes per session: Not on file  . Stress: Not on file  Relationships  . Social connections:    Talks on phone: Not on file    Gets together: Not on file    Attends religious service: Not on file    Active member of club or organization: Not on file    Attends meetings of clubs or organizations: Not on file    Relationship status: Not on file  . Intimate partner violence:    Fear of current or ex partner: Not on file    Emotionally abused: Not on file    Physically abused: Not on file    Forced sexual activity: Not on file  Other Topics Concern  . Not on file  Social History Narrative   Patient lives at home with mom.    Patient does not have any children.    Patient has a 10th grade education.    Patient is single.    Patient is left handed.    Does not have a living will or HPOA- full code.    Past Medical  History, Surgical history, Social history, and Family history were reviewed and updated as appropriate.   Please see review of systems for further details on the patient's review from today.   Objective:   Physical Exam:  LMP 12/19/2014 (Exact Date)   Physical Exam  Constitutional: She is oriented to person, place, and time. She appears well-developed. No distress.  Musculoskeletal: She exhibits no deformity.  Neurological: She is alert and oriented to person, place, and time. Coordination normal.  Psychiatric: Judgment normal. Her mood appears anxious. Her affect is blunt. Her affect is not angry, not labile and not inappropriate. Her speech is delayed. She is withdrawn. Cognition and memory are impaired. She exhibits a depressed mood. She expresses no homicidal and no suicidal ideation. She expresses no suicidal plans and no homicidal plans.  Patient presents as internally preoccupied.  She is a poor historian and answers most questions with the response of "sometimes." Insight fair. No auditory or visual hallucinations. No delusions.     Lab Review:     Component Value Date/Time   NA 139 10/13/2017 0901   K 3.8 10/13/2017 0901   CL 110 10/13/2017 0901   CO2 20 (L) 10/13/2017 0901   GLUCOSE 133 (H) 10/13/2017 0901   BUN 10 10/13/2017 0901   CREATININE 0.98 10/13/2017 0901   CALCIUM 8.5 (L) 10/13/2017 0901   PROT 6.4 07/29/2017 1220   ALBUMIN 3.9 07/29/2017 1220   AST 14 07/29/2017 1220   ALT 16 07/29/2017 1220   ALKPHOS 76 07/29/2017 1220   BILITOT 0.4 07/29/2017 1220   GFRNONAA >60 10/13/2017 0901   GFRAA >60 10/13/2017 0901       Component Value Date/Time   WBC 6.0 10/13/2017 0901   RBC 3.89 10/13/2017 0901   HGB 11.6 (L) 10/13/2017 0901   HGB 12.3 11/03/2014 1100   HCT  37.6 10/13/2017 0901   HCT 35.7 11/03/2014 1100   PLT 176 10/13/2017 0901   MCV 96.7 10/13/2017 0901   MCH 29.8 10/13/2017 0901   MCHC 30.9 10/13/2017 0901   RDW 14.3 10/13/2017 0901    LYMPHSABS 1.4 12/19/2016 1400   MONOABS 0.3 12/19/2016 1400   EOSABS 0.5 12/19/2016 1400   BASOSABS 0.0 12/19/2016 1400    No results found for: POCLITH, LITHIUM   No results found for: PHENYTOIN, PHENOBARB, VALPROATE, CBMZ   .res Assessment: Plan:   Case staffed with Dr. Clovis Pu.  Patient seen for 30 minutes and greater than 50% of visit spent counseling patient and her mother regarding treatment options.  Discussed that Abilify does seem to have been effective for her mood and psychotic signs and symptoms, however she had tremor and involuntary orofacial movements with Abilify and possible weight gain.  Discussed that Anette Guarneri has been better tolerated but not effective for mood and psychotic signs and symptoms.  Therefore recommend changing medication to another atypical antipsychotic that may be as effective as Abilify but better tolerated.  Discussed potential benefits, risks, and side effects of Vraylar. Discussed potential metabolic side effects associated with atypical antipsychotics, as well as potential risk for movement side effects. Advised pt to contact office if movement side effects occur.  Patient advised to stop Latuda 40 mg daily and start Vraylar 3 mg po q am for mood and psychotic signs and symptoms.  Bipolar disorder, current episode depressed, severe, with psychotic features ([place])  Please see After Visit Summary for patient specific instructions.  Future Appointments  Date Time Provider [place]  02/24/2018 10:00 AM Philemon Kingdom, MD [facility] None  03/09/2018 11:50 AM [facility] MM 2 [facility]  03/17/2018  9:30 AM Thayer Headings, PMHNP [facility] None  03/30/2018 10:40 AM Lucille Passy, MD [facility]  12/21/2018 10:30 AM Ward Givens, NP [facility] None  01/27/2019 11:00 AM Kathlen Brunswick, RN [facility]    No orders of the defined types were placed in this encounter.     -------------------------------

## 2018-02-24 ENCOUNTER — Ambulatory Visit (INDEPENDENT_AMBULATORY_CARE_PROVIDER_SITE_OTHER): Payer: Medicare Other | Admitting: Internal Medicine

## 2018-02-24 ENCOUNTER — Encounter: Payer: Self-pay | Admitting: Internal Medicine

## 2018-02-24 VITALS — BP 120/60 | HR 67 | Ht 66.0 in | Wt 288.0 lb

## 2018-02-24 DIAGNOSIS — E039 Hypothyroidism, unspecified: Secondary | ICD-10-CM

## 2018-02-24 DIAGNOSIS — R232 Flushing: Secondary | ICD-10-CM | POA: Diagnosis not present

## 2018-02-24 LAB — TSH: TSH: 2.96 u[IU]/mL (ref 0.35–4.50)

## 2018-02-24 LAB — T4, FREE: Free T4: 0.94 ng/dL (ref 0.60–1.60)

## 2018-02-24 NOTE — Progress Notes (Signed)
Patient ID: Nancy Hall, female   DOB: May 23, 1970, 47 y.o.   MRN: [id number]   HPI  Nancy Hall is a 47 y.o.-year-old female, returning for f/u for uncontrolled hypothyroidism. Last visit 1 year ago.  She is here with her mother who offers most of the history as patient is mostly nonverbal.  She has been very depressed since last OV. Her psychotropic medications have been changed, but without a lot of improvement.  Reviewed and addended history: Pt. has been dx with hypothyroidism in 6377 (47 years old); she is on Levothyroxine 75 >> then started LT5 100 mcg + Cytomel 25  Daily. We have been trying to reduce her Cytomel >>  LT4 125 mcg + LT3 5 mcg daily >> now only LT4 137 mcg daily.  Patient takes levothyroxine: - Daily per patient and mother's report - in am, at 6 am - fasting - at least 30 min from b'fast - no Ca, Fe, MVI - + PPIs at night - not on Biotin  I reviewed patient's TFTs: Lab Results  Component Value Date   TSH 5.49 (H) 12/29/2017   TSH 3.86 07/29/2017   TSH 2.49 06/02/2017   TSH 18.41 (H) 04/03/2017   TSH 4.99 (H) 02/24/2017   TSH 2.14 11/05/2016   TSH 0.81 08/23/2016   TSH 3.98 02/01/2016   TSH 2.89 12/21/2015   TSH 2.43 06/05/2015   FREET4 0.94 12/29/2017   FREET4 0.81 06/02/2017   FREET4 0.56 (L) 04/03/2017   FREET4 1.10 02/24/2017   FREET4 1.07 11/05/2016   FREET4 0.92 08/23/2016   FREET4 0.74 02/01/2016   FREET4 0.69 12/21/2015   FREET4 0.76 06/05/2015   FREET4 0.50 (L) 04/24/2015    Pt denies: - feeling nodules in neck - hoarseness - choking - SOB with lying down + Occasional dysphagia with food  She has + FH of thyroid disorders in: MGM. No FH of thyroid cancer. No h/o radiation tx to head or neck.  No herbal supplements. No Biotin use. No recent steroids use.   She also has a history of bipolar disease (seeing a counselor and psychiatrist), depression, GERD, h/o fibroids, h/o hysterectomy, h/o ear infections, h/o seizures.  At last  visit, she was telling me that her therapist was thinking about changing her psychotropic medications to Abilify, to also help with weight loss. She tried Abilify >> mouth tremors >> changed to [place] >> more depressed >> started Vraylar, continues Zocor, Lamictal.  She continues to have hot flashes, but improved.  ROS: Constitutional: no weight gain/no weight loss, no fatigue, + hot flashes, no subjective hypothermia Eyes: no blurry vision, no xerophthalmia ENT: no sore throat, + see HPI Cardiovascular: no CP/no SOB/no palpitations/no leg swelling Respiratory: no cough/no SOB/no wheezing Gastrointestinal: no N/no V/no D/no C/no acid reflux Musculoskeletal: no muscle aches/no joint aches Skin: no rashes, no hair loss Neurological: no tremors/no numbness/no tingling/no dizziness  I reviewed pt's medications, allergies, PMH, social hx, family hx, and changes were documented in the history of present illness. Otherwise, unchanged from my initial visit note.   Past Medical History:  Diagnosis Date  . Anemia 2016  . Anxiety   . Bipolar disorder ([place])   . Cancer ([place])   . Complication of anesthesia   . Depression   . Epigastric hernia   . Esophagitis    LA Class A  . GERD (gastroesophageal reflux disease)   . Hiatal hernia   . HOH (hard of hearing)   . Hypothyroidism   .  Learning disability   . Obesity   . PONV (postoperative nausea and vomiting)   . Pseudoseizures   . Seizure ([place]) 2014   last seizure 2(two) years ago  . Seizures ([place])    according to echart- seizures vs pseudoseizures  . Sleep apnea    No cpap  . Thyroid disease   . Ventral hernia    Past Surgical History:  Procedure Laterality Date  . ABDOMINAL HYSTERECTOMY N/A 01/09/2015   Procedure: HYSTERECTOMY ABDOMINAL/BILATERAL SALPINGECTOMY;  Surgeon: Rubie Maid, MD;  Location: [facility];  Service: Gynecology;  Laterality: N/A;  . DILATION AND CURETTAGE OF UTERUS    . HYSTEROSCOPY W/D&C N/A 12/19/2014    Procedure: DILATATION AND CURETTAGE /HYSTEROSCOPY;  Surgeon: Rubie Maid, MD;  Location: [facility];  Service: Gynecology;  Laterality: N/A;  . INNER EAR SURGERY Bilateral 1994   poor historian  . INSERTION OF MESH N/A 10/21/2017   Procedure: INSERTION OF MESH;  Surgeon: Donnie Mesa, MD;  Location: [place];  Service: General;  Laterality: N/A;  . TONSILLECTOMY    . VENTRAL HERNIA REPAIR N/A 10/21/2017   Procedure: OPEN REPAIR EPIGASTIC VENTRAL HERNIA WITH  MESH ERAS PATHWAY;  Surgeon: Donnie Mesa, MD;  Location: [place];  Service: General;  Laterality: N/A;   Social History   Social History  . Marital Status: Single    Spouse Name: N/A  . Number of Children: 0  . Years of Education: 10   Social History Main Topics  . Smoking status: Never Smoker   . Smokeless tobacco: Never Used  . Alcohol Use: No  . Drug Use: No   Social History Narrative   Patient lives at home with mom.    Patient does not have any children.    Patient has a 10th grade education.    Patient is single.    Patient is left handed.    Does not have a living will or HPOA- full code.   Current Outpatient Medications on File Prior to Visit  Medication Sig Dispense Refill  . acetaminophen (TYLENOL) 325 MG tablet Take 650 mg by mouth every 6 (six) hours as needed.    . cariprazine (VRAYLAR) capsule Take 1 capsule (3 mg total) by mouth daily. Samples given 35 capsule 0  . gabapentin (NEURONTIN) 300 MG capsule TAKE 1 CAPSULE BY MOUTH EVERYDAY AT BEDTIME 90 capsule 1  . lamoTRIgine (LAMICTAL) 150 MG tablet Take 1 tablet (150 mg total) by mouth 2 (two) times daily. 180 tablet 3  . levothyroxine (SYNTHROID, LEVOTHROID) 137 MCG tablet TAKE 1 TABLET (137 MCG TOTAL) BY MOUTH DAILY BEFORE BREAKFAST. 60 tablet 3  . lurasidone (LATUDA) 40 MG TABS tablet Take 1 tablet (40 mg total) by mouth daily with supper. 30 tablet 0  . Lurasidone HCl (LATUDA) 60 MG TABS Take 1 tablet (60 mg total) by mouth every evening. (Patient not taking:  Reported on 02/20/2018) 30 tablet 1  . omeprazole (PRILOSEC) 40 MG capsule TAKE 1 CAPSULE (40 MG TOTAL) BY MOUTH DAILY. 90 capsule 0  . oxyCODONE (OXY IR/ROXICODONE) 5 MG immediate release tablet Take 1 tablet (5 mg total) by mouth every 6 (six) hours as needed for severe pain. (Patient not taking: Reported on 01/21/2018) 20 tablet 0  . predniSONE (STERAPRED UNI-PAK 21 TAB) 10 MG (21) TBPK tablet Take as directed (Patient not taking: Reported on 01/21/2018) 21 tablet 0  . sertraline (ZOLOFT) 100 MG tablet Take 1 tablet (100 mg total) by mouth daily. 90 tablet 0  . topiramate (  TOPAMAX) 100 MG tablet One tablet in the morning and 1.5 tablets at night 225 tablet 3   No current facility-administered medications on file prior to visit.    Allergies  Allergen Reactions  . Penicillins Rash    Has patient had a PCN reaction causing immediate rash, facial/tongue/throat swelling, SOB or lightheadedness with hypotension: Yes Has patient had a PCN reaction causing severe rash involving mucus membranes or skin necrosis: No Has patient had a PCN reaction that required hospitalization: No Has patient had a PCN reaction occurring within the last 10 years: Yes If all of the above answers are "NO", then may proceed with Cephalosporin use.  3g Ancef administered 10/21/2017 without reaction/complic   Family History  Problem Relation Age of Onset  . Colon polyps Mother   . Celiac disease Mother   . Depression Mother   . Anxiety disorder Mother   . Colon polyps Father   . Celiac disease Brother   . Diabetes Sister   . Depression Sister   . Depression Brother   . CVA Brother   . Diabetes Paternal Grandmother   . Heart disease Paternal Grandfather   . Colon polyps Paternal Aunt   . Diabetes Maternal Aunt   . Heart disease Maternal Grandfather   . Heart disease Maternal Uncle   . Heart disease Maternal Aunt   . Seizures Neg Hx    PE: Ht 5\' 6"  (1.676 m) Comment: measured  Wt 288 lb (130.6 kg)   LMP  12/19/2014 (Exact Date)   BMI 46.48 kg/m  Wt Readings from Last 3 Encounters:  02/24/18 288 lb (130.6 kg)  01/21/18 286 lb 9.6 oz (130 kg)  12/29/17 286 lb 9.6 oz (130 kg)   Constitutional: overweight, in NAD Eyes: PERRLA, EOMI, no exophthalmos ENT: moist mucous membranes, no thyromegaly, no cervical lymphadenopathy Cardiovascular: RRR, No MRG Respiratory: CTA B Gastrointestinal: abdomen soft, NT, ND, BS+ Musculoskeletal: no deformities, strength intact in all 4 Skin: moist, warm, no rashes Neurological: no tremor with outstretched hands, DTR normal in all 4  ASSESSMENT: 1. Hypothyroidism  2. Obesity  3.  Hot flashes  PLAN:  1. Patient with longstanding uncontrolled hypothyroidism, previously on levothyroxine and liothyronine.  We have been gradually decreasing her LT3 dose and was able to stop in summer 2018.  Her head tremors improved after stopping LT3.  TFTs normalized.  At last visit, she was on 137 mcg LT4 daily.  A TSH returned slightly elevated but due to previous variability in her TFTs, I advised her to continue the same dose and recheck the labs in 1.5 months.  The next TSH was even higher, at 18.  I sent her a message through my chart asking her whether she changed how she was taking the levothyroxine.  I also wanted her to have another set of TFTs obtained 1.5 months later.  Surprisingly, the TFTs normalized in 04/2017 without changing the dose.  However, in 11/2017, she had another TSH obtained by PCP and this was slightly elevated.  I was not aware of the result but I do not think I would have changed the LT4 dose then. - She continues on 137 mcg LT4 daily - pt feels good on this dose - we discussed about taking the thyroid hormone every day, with water, >30 minutes before breakfast, separated by >4 hours from acid reflux medications, calcium, iron, multivitamins. Pt. is taking it correctly. - will check thyroid tests today: TSH and fT4 - If labs are abnormal, she will  need to return for repeat TFTs in 1.5 months  2. Obesity - BMI higher than 46, but no weight gain since last visit - At last visit I recommended the [facility] weight loss center but she did not look into this.  At this visit, I recommended the [facility] weight loss center.  They agree with the referral - referred to Dr. Adair Patter.  3.  Hot flashes -She has a history of TAH + BSO for fibroids. -She has hot flashes at night but better. -I recommended HRT-per OB/GYN to continue until she is 47 years old >> she was seen by them but did not start (possible contraindications)  Needs refills.  Office Visit on 02/24/2018  Component Date Value Ref Range Status  . TSH 02/24/2018 2.96  0.35 - 4.50 uIU/mL Final  . Free T4 02/24/2018 0.94  0.60 - 1.60 ng/dL Final   Comment: Specimens from patients who are undergoing biotin therapy and /or ingesting biotin supplements may contain high levels of biotin.  The higher biotin concentration in these specimens interferes with this Free T4 assay.  Specimens that contain high levels  of biotin may cause false high results for this Free T4 assay.  Please interpret results in light of the total clinical presentation of the patient.     PFTs are normal.  Philemon Kingdom, MD PhD [facility]

## 2018-02-24 NOTE — Patient Instructions (Signed)
Please continue Levothyroxine 137 mcg daily.  Take the thyroid hormone every day, with water, at least 30 minutes before breakfast, separated by at least 4 hours from: - acid reflux medications - calcium - iron - multivitamins  Please stop at the lab.  Please come back for a follow-up appointment in 1 year. 

## 2018-03-02 ENCOUNTER — Telehealth: Payer: Self-pay | Admitting: Psychiatry

## 2018-03-02 NOTE — Telephone Encounter (Signed)
Was prescribed vraylar and told to call in if lips trembling and hands shaking.  They are and would like a call please

## 2018-03-02 NOTE — Telephone Encounter (Signed)
Spoke with Mom and given her the message to stop medication, call back once sx's resolved.

## 2018-03-02 NOTE — Telephone Encounter (Signed)
Left voicemail to call back about medication

## 2018-03-09 ENCOUNTER — Ambulatory Visit
Admission: RE | Admit: 2018-03-09 | Discharge: 2018-03-09 | Disposition: A | Discharge status: Home / Self Care | Payer: Medicare Other | Source: Ambulatory Visit | Attending: Family Medicine | Admitting: Family Medicine

## 2018-03-09 DIAGNOSIS — Z1231 Encounter for screening mammogram for malignant neoplasm of breast: Secondary | ICD-10-CM

## 2018-03-11 ENCOUNTER — Telehealth: Payer: Self-pay | Admitting: Psychiatry

## 2018-03-11 NOTE — Telephone Encounter (Signed)
Mother Pt called and said that weaning off the medicine and now completely stopped now. Mother said that the shaking has stopped. What do they need to do now? Do you want her to try something new or wait until the next visit on 12/17.

## 2018-03-13 ENCOUNTER — Ambulatory Visit: Payer: Medicare Other | Admitting: Psychiatry

## 2018-03-17 ENCOUNTER — Ambulatory Visit (INDEPENDENT_AMBULATORY_CARE_PROVIDER_SITE_OTHER): Payer: Medicare Other | Admitting: Psychiatry

## 2018-03-17 ENCOUNTER — Encounter: Payer: Self-pay | Admitting: Psychiatry

## 2018-03-17 VITALS — BP 111/71 | HR 70

## 2018-03-17 DIAGNOSIS — F418 Other specified anxiety disorders: Secondary | ICD-10-CM

## 2018-03-17 DIAGNOSIS — F319 Bipolar disorder, unspecified: Secondary | ICD-10-CM | POA: Diagnosis not present

## 2018-03-17 DIAGNOSIS — F5101 Primary insomnia: Secondary | ICD-10-CM

## 2018-03-17 MED ORDER — LITHIUM CARBONATE 150 MG PO CAPS: ORAL_CAPSULE | ORAL | 1 refills | Status: DC

## 2018-03-17 NOTE — Progress Notes (Signed)
Nancy Hall [id number] 1970/12/24 47 y.o.  Subjective:   Patient ID:  Nancy Hall is a 47 y.o. (DOB 1970-07-03) female.  Chief Complaint:  Chief Complaint  Patient presents with  . Depression  . Other    Irritability, mood lability  . Insomnia    HPI Nancy Hall presents to the office today for follow-up of mood, anxiety, and psychosis.  She is accompanied by her mother.  Pt developed lip movements and tremor after taking Vraylar and was advised to stop Vraylar. They report that these side effects resolved about a week later. They report that they did not see any significant improvement with Vraylar.  Mother reports that pt has been irritable and not sleeping. Mother reports that pt's mood has been labile. Pt reports that she feels tired constantly. She reports that she has been feeling anxious. Mother reports that pt has not been wanting to go anywhere and when they do go out, she wants to get what she needs and return home as soon as possible. She reports that her mood has been sad and has been thinking about her deceased father. Mother was also away recently. Pt reports that she is awakening at night and cannot sleep. Mother reports that pt will periodically have episodes of cleaning extensively and other times will refuse to do anything. Mother reports that pt has been eating less overall since stopping Abilify. No change in concentration. Denies SI.   Mother reports that pt continues to believe that her nephew is doing things to upset her that are not occurring. Mother reports that this has been unchanged. They deny any other paranoia or delusions.    Past medication trials: Abilify-helpful for mood signs and symptoms.  Had tremor and involuntary orofacial movements.  Possible weight gain. Vraylar- Had tremor and oral facial movements Latuda- ineffective for mood signs and symptoms.  No tolerability issues. Sertraline-has taken long-term.  May have been helpful for  depression. Lamictal-prescribed for seizures by neurologist.  Patient has taken long-term and effect on mood is unclear. Topamax- taken long-term and was started for seizures. Depakote-prescribed for seizures in the past.   Review of Systems:  Review of Systems  Musculoskeletal: Negative for gait problem.       Conitnued pain in Left Hand.   Skin: Positive for rash.       They report she has had a rash for the last few weeks with itching. They report that it is mainly on her thighs and legs. Slight breakout around her mouth. Mother reports that it will heal and then recurr. Mother reports that pt will see PCP on 03/30/18 or sooner.   Neurological: Negative for tremors.  Psychiatric/Behavioral:       Please refer to HPI    Medications: I have reviewed the patient's current medications.  Current Outpatient Medications  Medication Sig Dispense Refill  . acetaminophen (TYLENOL) 325 MG tablet Take 650 mg by mouth every 6 (six) hours as needed.    . gabapentin (NEURONTIN) 300 MG capsule TAKE 1 CAPSULE BY MOUTH EVERYDAY AT BEDTIME 90 capsule 1  . lamoTRIgine (LAMICTAL) 150 MG tablet Take 1 tablet (150 mg total) by mouth 2 (two) times daily. 180 tablet 3  . levothyroxine (SYNTHROID, LEVOTHROID) 137 MCG tablet TAKE 1 TABLET (137 MCG TOTAL) BY MOUTH DAILY BEFORE BREAKFAST. 60 tablet 3  . omeprazole (PRILOSEC) 40 MG capsule TAKE 1 CAPSULE (40 MG TOTAL) BY MOUTH DAILY. 90 capsule 0  . oxyCODONE (OXY IR/ROXICODONE) 5 MG  immediate release tablet Take 1 tablet (5 mg total) by mouth every 6 (six) hours as needed for severe pain. 20 tablet 0  . sertraline (ZOLOFT) 100 MG tablet Take 1 tablet (100 mg total) by mouth daily. 90 tablet 0  . topiramate (TOPAMAX) 100 MG tablet One tablet in the morning and 1.5 tablets at night 225 tablet 3  . lithium carbonate 150 MG capsule Take 1 capsule at bedtime for 5 nights, then increase to 2 capsules at bedtime 60 capsule 1  . predniSONE (STERAPRED UNI-PAK 21 TAB) 10  MG (21) TBPK tablet Take as directed (Patient not taking: Reported on 02/24/2018) 21 tablet 0   No current facility-administered medications for this visit.     Medication Side Effects: None  Allergies:  Allergies  Allergen Reactions  . Penicillins Rash    Has patient had a PCN reaction causing immediate rash, facial/tongue/throat swelling, SOB or lightheadedness with hypotension: Yes Has patient had a PCN reaction causing severe rash involving mucus membranes or skin necrosis: No Has patient had a PCN reaction that required hospitalization: No Has patient had a PCN reaction occurring within the last 10 years: Yes If all of the above answers are "NO", then may proceed with Cephalosporin use.  3g Ancef administered 10/21/2017 without reaction/complic    Past Medical History:  Diagnosis Date  . Anemia 2016  . Anxiety   . Bipolar disorder ([place])   . Cancer ([place])   . Complication of anesthesia   . Depression   . Epigastric hernia   . Esophagitis    LA Class A  . GERD (gastroesophageal reflux disease)   . Hiatal hernia   . HOH (hard of hearing)   . Hypothyroidism   . Learning disability   . Obesity   . PONV (postoperative nausea and vomiting)   . Pseudoseizures   . Seizure ([place]) 2014   last seizure 2(two) years ago  . Seizures ([place])    according to echart- seizures vs pseudoseizures  . Sleep apnea    No cpap  . Thyroid disease   . Ventral hernia     Family History  Problem Relation Age of Onset  . Colon polyps Mother   . Celiac disease Mother   . Depression Mother   . Anxiety disorder Mother   . Colon polyps Father   . Celiac disease Brother   . Diabetes Sister   . Depression Sister   . Depression Brother   . CVA Brother   . Diabetes Paternal Grandmother   . Heart disease Paternal Grandfather   . Colon polyps Paternal Aunt   . Diabetes Maternal Aunt   . Heart disease Maternal Grandfather   . Heart disease Maternal Uncle   . Heart disease Maternal Aunt   .  Seizures Neg Hx     Social History   Socioeconomic History  . Marital status: Single    Spouse name: Not on file  . Number of children: 0  . Years of education: 10  . Highest education level: Not on file  Occupational History  . Not on file  Social Needs  . Financial resource strain: Not on file  . Food insecurity:    Worry: Not on file    Inability: Not on file  . Transportation needs:    Medical: Not on file    Non-medical: Not on file  Tobacco Use  . Smoking status: Never Smoker  . Smokeless tobacco: Never Used  Substance and Sexual Activity  . Alcohol  use: No    Alcohol/week: 0.0 standard drinks  . Drug use: No  . Sexual activity: Not Currently  Lifestyle  . Physical activity:    Days per week: Not on file    Minutes per session: Not on file  . Stress: Not on file  Relationships  . Social connections:    Talks on phone: Not on file    Gets together: Not on file    Attends religious service: Not on file    Active member of club or organization: Not on file    Attends meetings of clubs or organizations: Not on file    Relationship status: Not on file  . Intimate partner violence:    Fear of current or ex partner: Not on file    Emotionally abused: Not on file    Physically abused: Not on file    Forced sexual activity: Not on file  Other Topics Concern  . Not on file  Social History Narrative   Patient lives at home with mom.    Patient does not have any children.    Patient has a 10th grade education.    Patient is single.    Patient is left handed.    Does not have a living will or HPOA- full code.    Past Medical History, Surgical history, Social history, and Family history were reviewed and updated as appropriate.   Please see review of systems for further details on the patient's review from today.   Objective:   Physical Exam:  BP 111/71   Pulse 70   LMP 12/19/2014 (Exact Date)   Physical Exam Constitutional:      General: She is not in  acute distress.    Appearance: She is well-developed.  Musculoskeletal:        General: No deformity.  Neurological:     Mental Status: She is alert and oriented to person, place, and time.     Coordination: Coordination normal.  Psychiatric:        Mood and Affect: Mood is anxious and depressed. Affect is blunt. Affect is not labile, angry or inappropriate.        Behavior: Behavior is slowed and withdrawn.        Thought Content: Thought content normal. Thought content does not include homicidal or suicidal ideation. Thought content does not include homicidal or suicidal plan.        Judgment: Judgment normal.     Comments: Speech is soft and decreased in quantity Limited insight. No auditory or visual hallucinations. No delusions.      Lab Review:     Component Value Date/Time   NA 139 10/13/2017 0901   K 3.8 10/13/2017 0901   CL 110 10/13/2017 0901   CO2 20 (L) 10/13/2017 0901   GLUCOSE 133 (H) 10/13/2017 0901   BUN 10 10/13/2017 0901   CREATININE 0.98 10/13/2017 0901   CALCIUM 8.5 (L) 10/13/2017 0901   PROT 6.4 07/29/2017 1220   ALBUMIN 3.9 07/29/2017 1220   AST 14 07/29/2017 1220   ALT 16 07/29/2017 1220   ALKPHOS 76 07/29/2017 1220   BILITOT 0.4 07/29/2017 1220   GFRNONAA >60 10/13/2017 0901   GFRAA >60 10/13/2017 0901       Component Value Date/Time   WBC 6.0 10/13/2017 0901   RBC 3.89 10/13/2017 0901   HGB 11.6 (L) 10/13/2017 0901   HGB 12.3 11/03/2014 1100   HCT 37.6 10/13/2017 0901   HCT 35.7 11/03/2014 1100  PLT 176 10/13/2017 0901   MCV 96.7 10/13/2017 0901   MCH 29.8 10/13/2017 0901   MCHC 30.9 10/13/2017 0901   RDW 14.3 10/13/2017 0901   LYMPHSABS 1.4 12/19/2016 1400   MONOABS 0.3 12/19/2016 1400   EOSABS 0.5 12/19/2016 1400   BASOSABS 0.0 12/19/2016 1400    No results found for: POCLITH, LITHIUM   No results found for: PHENYTOIN, PHENOBARB, VALPROATE, CBMZ   .res Assessment: Plan:   Case staffed with Dr. Clovis Pu.  Discussed potential  benefits, risks, and side effects of lithium at low dose.  Will start lithium 150 mg at bedtime for 5 nights, then increase to 300 mg p.o. nightly for mood stabilization.  Discussed that lithium may also be helpful for insomnia.  Discussed that lithium may be an alternative for mood stabilization without potential risk of movement side effects that she has experienced with several atypical antipsychotics.  Recommended that patient or her mother call with any worsening signs and symptoms, particularly the development of any psychotic signs and symptoms.  Will continue all other medications without changes at this time. Bipolar depression ([place]) - Plan: lithium carbonate 150 MG capsule  Please see After Visit Summary for patient specific instructions.  Future Appointments  Date Time Provider [place]  03/30/2018 10:40 AM Lucille Passy, MD [facility]  04/13/2018  9:00 AM Cottle, Lucious Groves, LCSW [facility] None  04/14/2018  9:45 AM Thayer Headings, PMHNP [facility] None  12/21/2018 10:30 AM Ward Givens, NP [facility] None  01/27/2019 11:00 AM Kathlen Brunswick, RN [facility]  02/23/2019 10:00 AM Philemon Kingdom, MD [facility] None    No orders of the defined types were placed in this encounter.     -------------------------------

## 2018-03-18 ENCOUNTER — Encounter: Payer: Self-pay | Admitting: Psychiatry

## 2018-03-28 NOTE — Progress Notes (Addendum)
Subjective:   Patient ID: Nancy Hall, female    DOB: 12-31-1970, 47 y.o.   MRN: [id number]  Nancy Hall is a pleasant 47 y.o. year old female who presents to clinic today with Follow-up (Patient is here today to F/U.  Last seen on 9.30.19 and pt was rreferred to hand specialist for Dupuytren's Contracture.  She saw Dr. Erlinda Hong on 10.3.19 who completed labs to R/O Rheumatologic and Gout and only Sed Rate was elevated at 25 but all else WNL.  Also referred to [facility] for SK vs Atypical Nevus and was scheduled for 11.22.19.  Mom states that she does not recall ever getting a call about a Derm appt. )  on 03/30/2018  HPI:  Here with her mother today to follow up/update me.  Bipolar disorder- just saw Thayer Headings on 03/17/18- started her on Lithium. She does feel tremors improved but she is still depressed.  Denies SI or HI. Has appointment to see her psychiatrist next week for a follow up.  Hypothyroidism- Followed by Dr. Cruzita Lederer.  Last saw her on 02/24/18. Note reviewed.  No changes made to rxs.  Lab Results  Component Value Date   TSH 2.96 02/24/2018   Saw Dr. Erlinda Hong on 01/01/18 for hand pain- diagnosed with trigger finger. Labs were negative for RA and gout.  SED rate was only mildly elevated. Lab Results  Component Value Date   ESRSEDRATE 25 (H) 01/01/2018    Advised cortisone injections but she wasn't ready for that but she is considering this tx now that since she is having more trouble gripping things- dropping more objects she is holidng.    Current Outpatient Medications on File Prior to Visit  Medication Sig Dispense Refill  . acetaminophen (TYLENOL) 325 MG tablet Take 650 mg by mouth every 6 (six) hours as needed.    . gabapentin (NEURONTIN) 300 MG capsule TAKE 1 CAPSULE BY MOUTH EVERYDAY AT BEDTIME 90 capsule 1  . lamoTRIgine (LAMICTAL) 150 MG tablet Take 1 tablet (150 mg total) by mouth 2 (two) times daily. 180 tablet 3  . levothyroxine  (SYNTHROID, LEVOTHROID) 137 MCG tablet TAKE 1 TABLET (137 MCG TOTAL) BY MOUTH DAILY BEFORE BREAKFAST. 60 tablet 3  . lithium carbonate 150 MG capsule Take 1 capsule at bedtime for 5 nights, then increase to 2 capsules at bedtime 60 capsule 1  . omeprazole (PRILOSEC) 40 MG capsule TAKE 1 CAPSULE (40 MG TOTAL) BY MOUTH DAILY. 90 capsule 0  . oxyCODONE (OXY IR/ROXICODONE) 5 MG immediate release tablet Take 1 tablet (5 mg total) by mouth every 6 (six) hours as needed for severe pain. 20 tablet 0  . sertraline (ZOLOFT) 100 MG tablet Take 1 tablet (100 mg total) by mouth daily. 90 tablet 0  . topiramate (TOPAMAX) 100 MG tablet One tablet in the morning and 1.5 tablets at night 225 tablet 3   No current facility-administered medications on file prior to visit.     Allergies  Allergen Reactions  . Penicillins Rash    Has patient had a PCN reaction causing immediate rash, facial/tongue/throat swelling, SOB or lightheadedness with hypotension: Yes Has patient had a PCN reaction causing severe rash involving mucus membranes or skin necrosis: No Has patient had a PCN reaction that required hospitalization: No Has patient had a PCN reaction occurring within the last 10 years: Yes If all of the above answers are "NO", then may proceed with Cephalosporin use.  3g Ancef administered 10/21/2017 without reaction/complic  Past Medical History:  Diagnosis Date  . Anemia 2016  . Anxiety   . Bipolar disorder ([place])   . Cancer ([place])   . Complication of anesthesia   . Depression   . Epigastric hernia   . Esophagitis    LA Class A  . GERD (gastroesophageal reflux disease)   . Hiatal hernia   . HOH (hard of hearing)   . Hypothyroidism   . Learning disability   . Obesity   . PONV (postoperative nausea and vomiting)   . Pseudoseizures   . Seizure ([place]) 2014   last seizure 2(two) years ago  . Seizures ([place])    according to echart- seizures vs pseudoseizures  . Sleep apnea    No cpap  . Thyroid  disease   . Ventral hernia     Past Surgical History:  Procedure Laterality Date  . ABDOMINAL HYSTERECTOMY N/A 01/09/2015   Procedure: HYSTERECTOMY ABDOMINAL/BILATERAL SALPINGECTOMY;  Surgeon: Rubie Maid, MD;  Location: [facility];  Service: Gynecology;  Laterality: N/A;  . DILATION AND CURETTAGE OF UTERUS    . HYSTEROSCOPY W/D&C N/A 12/19/2014   Procedure: DILATATION AND CURETTAGE /HYSTEROSCOPY;  Surgeon: Rubie Maid, MD;  Location: [facility];  Service: Gynecology;  Laterality: N/A;  . INNER EAR SURGERY Bilateral 1994   poor historian  . INSERTION OF MESH N/A 10/21/2017   Procedure: INSERTION OF MESH;  Surgeon: Donnie Mesa, MD;  Location: [facility];  Service: General;  Laterality: N/A;  . TONSILLECTOMY    . VENTRAL HERNIA REPAIR N/A 10/21/2017   Procedure: OPEN REPAIR EPIGASTIC VENTRAL HERNIA WITH  MESH ERAS PATHWAY;  Surgeon: Donnie Mesa, MD;  Location: [facility];  Service: General;  Laterality: N/A;    Family History  Problem Relation Age of Onset  . Colon polyps Mother   . Celiac disease Mother   . Depression Mother   . Anxiety disorder Mother   . Colon polyps Father   . Celiac disease Brother   . Diabetes Sister   . Depression Sister   . Depression Brother   . CVA Brother   . Diabetes Paternal Grandmother   . Heart disease Paternal Grandfather   . Colon polyps Paternal Aunt   . Diabetes Maternal Aunt   . Heart disease Maternal Grandfather   . Heart disease Maternal Uncle   . Heart disease Maternal Aunt   . Seizures Neg Hx     Social History   Socioeconomic History  . Marital status: Single    Spouse name: Not on file  . Number of children: 0  . Years of education: 10  . Highest education level: Not on file  Occupational History  . Not on file  Social Needs  . Financial resource strain: Not on file  . Food insecurity:    Worry: Not on file    Inability: Not on file  . Transportation needs:    Medical: Not on file    Non-medical: Not on file  Tobacco Use  .  Smoking status: Never Smoker  . Smokeless tobacco: Never Used  Substance and Sexual Activity  . Alcohol use: No    Alcohol/week: 0.0 standard drinks  . Drug use: No  . Sexual activity: Not Currently  Lifestyle  . Physical activity:    Days per week: Not on file    Minutes per session: Not on file  . Stress: Not on file  Relationships  . Social connections:    Talks on phone: Not on file    Gets  together: Not on file    Attends religious service: Not on file    Active member of club or organization: Not on file    Attends meetings of clubs or organizations: Not on file    Relationship status: Not on file  . Intimate partner violence:    Fear of current or ex partner: Not on file    Emotionally abused: Not on file    Physically abused: Not on file    Forced sexual activity: Not on file  Other Topics Concern  . Not on file  Social History Narrative   Patient lives at home with mom.    Patient does not have any children.    Patient has a 10th grade education.    Patient is single.    Patient is left handed.    Does not have a living will or HPOA- full code.   The PMH, PSH, Social History, Family History, Medications, and allergies have been reviewed in [facility], and have been updated if relevant.   Review of Systems  Constitutional: Positive for fatigue.  HENT: Negative.   Eyes: Negative.   Respiratory: Negative.   Cardiovascular: Negative.   Gastrointestinal: Negative.   Endocrine: Negative.   Genitourinary: Negative.   Musculoskeletal: Positive for arthralgias.  Skin: Negative.   Allergic/Immunologic: Negative.   Neurological: Negative for dizziness, tremors, facial asymmetry, weakness and headaches.  Hematological: Negative.   Psychiatric/Behavioral: Positive for dysphoric mood and sleep disturbance. Negative for self-injury and suicidal ideas.  All other systems reviewed and are negative.      Objective:    BP 132/82 (BP Location: Left Arm, Patient Position:  Sitting, Cuff Size: Normal)   Pulse 91   Temp 98.6 F (37 C) (Oral)   Ht 5\' 6"  (1.676 m)   Wt 286 lb 3.2 oz (129.8 kg)   LMP 12/19/2014 (Exact Date)   SpO2 95%   BMI 46.19 kg/m    Physical Exam Vitals signs and nursing note reviewed.  Constitutional:      General: She is not in acute distress.    Appearance: Normal appearance. She is obese. She is not toxic-appearing or diaphoretic.  HENT:     Head: Normocephalic and atraumatic.     Nose: Nose normal.     Mouth/Throat:     Mouth: Mucous membranes are moist.  Eyes:     General: No visual field deficit.    Extraocular Movements: Extraocular movements intact.  Cardiovascular:     Rate and Rhythm: Normal rate and regular rhythm.  Pulmonary:     Effort: Pulmonary effort is normal.  Neurological:     Mental Status: She is alert and oriented to person, place, and time.     Cranial Nerves: No cranial nerve deficit, dysarthria or facial asymmetry.     Motor: No weakness or tremor.     Gait: Gait normal.  Psychiatric:        Mood and Affect: Mood normal.        Behavior: Behavior normal.        Thought Content: Thought content normal.        Judgment: Judgment normal.           Assessment & Plan:   Bipolar disorder in partial remission, most recent episode unspecified type ([place])  Left hand pain  Hypothyroidism, unspecified type  Tremor No follow-ups on file.

## 2018-03-30 ENCOUNTER — Ambulatory Visit (INDEPENDENT_AMBULATORY_CARE_PROVIDER_SITE_OTHER): Payer: Medicare Other | Admitting: Family Medicine

## 2018-03-30 ENCOUNTER — Encounter: Payer: Self-pay | Admitting: Family Medicine

## 2018-03-30 VITALS — BP 132/82 | HR 91 | Temp 98.6°F | Ht 66.0 in | Wt 286.2 lb

## 2018-03-30 DIAGNOSIS — F3170 Bipolar disorder, currently in remission, most recent episode unspecified: Secondary | ICD-10-CM

## 2018-03-30 DIAGNOSIS — M79642 Pain in left hand: Secondary | ICD-10-CM | POA: Diagnosis not present

## 2018-03-30 DIAGNOSIS — E039 Hypothyroidism, unspecified: Secondary | ICD-10-CM | POA: Diagnosis not present

## 2018-03-30 DIAGNOSIS — R251 Tremor, unspecified: Secondary | ICD-10-CM

## 2018-03-30 NOTE — Assessment & Plan Note (Signed)
With severe depression. Followed by psychiatry.  Started lithium last week.  Feels tremors already improved.  She hopes to get on the right medications so that her depression and fatigue improve as well. She has follow up with psychiatry scheduled for next week, according to her mother.

## 2018-03-30 NOTE — Assessment & Plan Note (Signed)
Followed by endo. Notes reviewed, no changes made to rxs.

## 2018-03-30 NOTE — Assessment & Plan Note (Signed)
Saw Dr. Erlinda Hong- notes reviewed. Diagnosed with trigger finger.  Labs neg for gout and RA. She is considering making a follow up appointment with him for cortisone injections.

## 2018-03-30 NOTE — Patient Instructions (Addendum)
Great to see you.  Call the dermatologist to make an appointment.  Schedule follow up with Dr. Erlinda Hong.  I hope your brother gets better.  Keep me updated.

## 2018-03-30 NOTE — Assessment & Plan Note (Addendum)
>  25 minutes spent in face to face time with patient, >50% spent in counselling or coordination of care discussing mood disorder/bipolar, tremor, hypothyroidism and hand pain.  Presumed to be medication induced. Improved now that she is on lithium. No changes made - I did ask her mom to make sure her lithium levels are monitored and if not, I can draw them here. The patient indicates understanding of these issues and agrees with the plan.

## 2018-04-13 ENCOUNTER — Ambulatory Visit (INDEPENDENT_AMBULATORY_CARE_PROVIDER_SITE_OTHER): Payer: Medicare Other | Admitting: Psychology

## 2018-04-13 DIAGNOSIS — F3132 Bipolar disorder, current episode depressed, moderate: Secondary | ICD-10-CM | POA: Diagnosis not present

## 2018-04-14 ENCOUNTER — Encounter: Payer: Self-pay | Admitting: Psychiatry

## 2018-04-14 ENCOUNTER — Ambulatory Visit (INDEPENDENT_AMBULATORY_CARE_PROVIDER_SITE_OTHER): Payer: Medicare Other | Admitting: Psychiatry

## 2018-04-14 VITALS — BP 108/74 | HR 66

## 2018-04-14 DIAGNOSIS — F418 Other specified anxiety disorders: Secondary | ICD-10-CM

## 2018-04-14 DIAGNOSIS — F319 Bipolar disorder, unspecified: Secondary | ICD-10-CM

## 2018-04-14 DIAGNOSIS — F5101 Primary insomnia: Secondary | ICD-10-CM | POA: Diagnosis not present

## 2018-04-14 MED ORDER — SERTRALINE HCL 100 MG PO TABS: 150.0000 mg | ORAL_TABLET | Freq: Every day | ORAL | 0 refills | Status: DC

## 2018-04-14 MED ORDER — LITHIUM CARBONATE 150 MG PO CAPS: 150.0000 mg | ORAL_CAPSULE | Freq: Every day | ORAL | 0 refills | Status: DC

## 2018-04-14 NOTE — Progress Notes (Signed)
Nancy Hall [id number] November 08, 1970 48 y.o.  Subjective:   Patient ID:  Nancy Hall is a 48 y.o. (DOB 1970/07/04) female.  Chief Complaint:  Chief Complaint  Patient presents with  . Depression  . Anxiety    HPI Nancy Hall presents to the office today for follow-up of mood and anxiety. She is accompanied by her mother who reports that pt is "not good" and having significant mood s/s. Mother reports that she has not noticed any improvement with medication. Mother reports that pt has been more irritable and "doesn't mind telling anybody off, and that's not like her."  Denies any recent manic s/s.   Mother reports that pt's energy and motivation remain low. Pt reports that she continues to think about her deceased father. Mother may need back surgery and pt worries about what would happen to her if/when something happens to her mother. Mother reports that ppt's father died after surgery and pt's brother has had a CVA. Reports that pt's other brother has been having recent health issues. Pt reports that she is anxious "sometimes." Continues to have compulsion around not running out of things .Wants things done a certain way and in a certain order. Mother reports that pt is sleeping more and falling asleep often during the day. Mother reports that her appetite has been increased. Able to focus on movies and TV shows. Denies SI.   Mother reports that pt and her nephew continue to have frequent conflict. Pt denies any paranoia except that belief her nephew is out to get her.   Past medication trials: Abilify-helpful for mood signs and symptoms. Had tremor and involuntary orofacial movements. Possible weight gain. Vraylar- Had tremor and oral facial movements Latuda- ineffective for mood signs and symptoms. No tolerability issues. Sertraline-has taken long-term. May have been helpful for depression. Lamictal-prescribed for seizures by neurologist. Patient has taken long-term and effect  on mood is unclear. Topamax- taken long-term and was started for seizures. Depakote-prescribed for seizures in the past. Lithium- tremor, hot flashes, worsening mood.    Review of Systems:  Review of Systems  Musculoskeletal: Negative for gait problem.  Skin: Positive for rash.  Neurological: Positive for tremors.  Psychiatric/Behavioral:       Please refer to HPI    Medications: I have reviewed the patient's current medications.  Current Outpatient Medications  Medication Sig Dispense Refill  . acetaminophen (TYLENOL) 325 MG tablet Take 650 mg by mouth every 6 (six) hours as needed.    . gabapentin (NEURONTIN) 300 MG capsule TAKE 1 CAPSULE BY MOUTH EVERYDAY AT BEDTIME 90 capsule 1  . lamoTRIgine (LAMICTAL) 150 MG tablet Take 1 tablet (150 mg total) by mouth 2 (two) times daily. 180 tablet 3  . levothyroxine (SYNTHROID, LEVOTHROID) 137 MCG tablet TAKE 1 TABLET (137 MCG TOTAL) BY MOUTH DAILY BEFORE BREAKFAST. 60 tablet 3  . lithium carbonate 150 MG capsule Take 1 capsule (150 mg total) by mouth at bedtime for 5 days. 5 capsule 0  . omeprazole (PRILOSEC) 40 MG capsule TAKE 1 CAPSULE (40 MG TOTAL) BY MOUTH DAILY. 90 capsule 0  . oxyCODONE (OXY IR/ROXICODONE) 5 MG immediate release tablet Take 1 tablet (5 mg total) by mouth every 6 (six) hours as needed for severe pain. 20 tablet 0  . sertraline (ZOLOFT) 100 MG tablet Take 1.5 tablets (150 mg total) by mouth daily. 135 tablet 0  . topiramate (TOPAMAX) 100 MG tablet One tablet in the morning and 1.5 tablets at night 225 tablet  3   No current facility-administered medications for this visit.     Medication Side Effects: Other: Hot flashes. Will feel hot and cold frequently. Tremor.   Allergies:  Allergies  Allergen Reactions  . Penicillins Rash    Has patient had a PCN reaction causing immediate rash, facial/tongue/throat swelling, SOB or lightheadedness with hypotension: Yes Has patient had a PCN reaction causing severe rash  involving mucus membranes or skin necrosis: No Has patient had a PCN reaction that required hospitalization: No Has patient had a PCN reaction occurring within the last 10 years: Yes If all of the above answers are "NO", then may proceed with Cephalosporin use.  3g Ancef administered 10/21/2017 without reaction/complic    Past Medical History:  Diagnosis Date  . Anemia 2016  . Anxiety   . Bipolar disorder ([place])   . Cancer ([place])   . Complication of anesthesia   . Depression   . Epigastric hernia   . Esophagitis    LA Class A  . GERD (gastroesophageal reflux disease)   . Hiatal hernia   . HOH (hard of hearing)   . Hypothyroidism   . Learning disability   . Obesity   . PONV (postoperative nausea and vomiting)   . Pseudoseizures   . Seizure ([place]) 2014   last seizure 2(two) years ago  . Seizures ([place])    according to echart- seizures vs pseudoseizures  . Sleep apnea    No cpap  . Thyroid disease   . Ventral hernia     Family History  Problem Relation Age of Onset  . Colon polyps Mother   . Celiac disease Mother   . Depression Mother   . Anxiety disorder Mother   . Colon polyps Father   . Celiac disease Brother   . Diabetes Sister   . Depression Sister   . Depression Brother   . CVA Brother   . Diabetes Paternal Grandmother   . Heart disease Paternal Grandfather   . Colon polyps Paternal Aunt   . Diabetes Maternal Aunt   . Heart disease Maternal Grandfather   . Heart disease Maternal Uncle   . Heart disease Maternal Aunt   . Seizures Neg Hx     Social History   Socioeconomic History  . Marital status: Single    Spouse name: Not on file  . Number of children: 0  . Years of education: 10  . Highest education level: Not on file  Occupational History  . Not on file  Social Needs  . Financial resource strain: Not on file  . Food insecurity:    Worry: Not on file    Inability: Not on file  . Transportation needs:    Medical: Not on file    Non-medical:  Not on file  Tobacco Use  . Smoking status: Never Smoker  . Smokeless tobacco: Never Used  Substance and Sexual Activity  . Alcohol use: No    Alcohol/week: 0.0 standard drinks  . Drug use: No  . Sexual activity: Not Currently  Lifestyle  . Physical activity:    Days per week: Not on file    Minutes per session: Not on file  . Stress: Not on file  Relationships  . Social connections:    Talks on phone: Not on file    Gets together: Not on file    Attends religious service: Not on file    Active member of club or organization: Not on file    Attends meetings of  clubs or organizations: Not on file    Relationship status: Not on file  . Intimate partner violence:    Fear of current or ex partner: Not on file    Emotionally abused: Not on file    Physically abused: Not on file    Forced sexual activity: Not on file  Other Topics Concern  . Not on file  Social History Narrative   Patient lives at home with mom.    Patient does not have any children.    Patient has a 10th grade education.    Patient is single.    Patient is left handed.    Does not have a living will or HPOA- full code.    Past Medical History, Surgical history, Social history, and Family history were reviewed and updated as appropriate.   Please see review of systems for further details on the patient's review from today.   Objective:   Physical Exam:  BP 108/74   Pulse 66   LMP 12/19/2014 (Exact Date)   Physical Exam Constitutional:      General: She is not in acute distress.    Appearance: She is well-developed.  Musculoskeletal:        General: No deformity.  Neurological:     Mental Status: She is alert and oriented to person, place, and time.     Coordination: Coordination normal.  Psychiatric:        Mood and Affect: Mood is depressed. Mood is not anxious. Affect is blunt. Affect is not labile, angry or inappropriate.        Behavior: Behavior is slowed and withdrawn.        Thought  Content: Thought content normal. Thought content does not include homicidal or suicidal ideation. Thought content does not include homicidal or suicidal plan.        Cognition and Memory: Cognition is impaired.        Judgment: Judgment is not impulsive.     Comments: Speech is soft,slowed, and not spontaneous Insight intact. No auditory or visual hallucinations. No delusions.      Lab Review:     Component Value Date/Time   NA 139 10/13/2017 0901   K 3.8 10/13/2017 0901   CL 110 10/13/2017 0901   CO2 20 (L) 10/13/2017 0901   GLUCOSE 133 (H) 10/13/2017 0901   BUN 10 10/13/2017 0901   CREATININE 0.98 10/13/2017 0901   CALCIUM 8.5 (L) 10/13/2017 0901   PROT 6.4 07/29/2017 1220   ALBUMIN 3.9 07/29/2017 1220   AST 14 07/29/2017 1220   ALT 16 07/29/2017 1220   ALKPHOS 76 07/29/2017 1220   BILITOT 0.4 07/29/2017 1220   GFRNONAA >60 10/13/2017 0901   GFRAA >60 10/13/2017 0901       Component Value Date/Time   WBC 6.0 10/13/2017 0901   RBC 3.89 10/13/2017 0901   HGB 11.6 (L) 10/13/2017 0901   HGB 12.3 11/03/2014 1100   HCT 37.6 10/13/2017 0901   HCT 35.7 11/03/2014 1100   PLT 176 10/13/2017 0901   MCV 96.7 10/13/2017 0901   MCH 29.8 10/13/2017 0901   MCHC 30.9 10/13/2017 0901   RDW 14.3 10/13/2017 0901   LYMPHSABS 1.4 12/19/2016 1400   MONOABS 0.3 12/19/2016 1400   EOSABS 0.5 12/19/2016 1400   BASOSABS 0.0 12/19/2016 1400    No results found for: POCLITH, LITHIUM   No results found for: PHENYTOIN, PHENOBARB, VALPROATE, CBMZ   .res Assessment: Plan:   Will increase Sertraline to 150 mg  po qd to improve mood and anxiety since pt continues to exhibit worsening mood and anxiety s/s. Discussed monitoring for any s/s of mania and advised mother to contact office asap if manic s/s are noted. Discussed that if mother has back surgery and is not able to accompany pt to upcoming apts that she can call or send a note with any concerns or observations.  Will decrease Lithium to  150 mg po QHS x 5 days, then stop due to side effects and no improvement in mood.  Bipolar depression ([place]) - Plan: sertraline (ZOLOFT) 100 MG tablet, lithium carbonate 150 MG capsule  Other specified anxiety disorders - Plan: sertraline (ZOLOFT) 100 MG tablet  Primary insomnia  Bipolar depression ([place]) - Unstable - Plan: sertraline (ZOLOFT) 100 MG tablet, lithium carbonate 150 MG capsule  Please see After Visit Summary for patient specific instructions.  Future Appointments  Date Time Provider [place]  05/12/2018  9:00 AM Thayer Headings, PMHNP [facility] None  12/21/2018 10:30 AM Ward Givens, NP [facility] None  01/27/2019 11:00 AM Dennis Bast, RN [facility]  02/23/2019 10:00 AM Philemon Kingdom, MD [facility] None    No orders of the defined types were placed in this encounter.     -------------------------------

## 2018-04-14 NOTE — Patient Instructions (Signed)
Decrease Lithium to 150 mg at bedtime x 5 nights, then stop.   Increase Sertraline (Zoloft) to 150 mg daily.

## 2018-04-28 ENCOUNTER — Telehealth: Payer: Self-pay | Admitting: Internal Medicine

## 2018-04-28 MED ORDER — LEVOTHYROXINE SODIUM 137 MCG PO TABS: 125.0000 ug | ORAL_TABLET | Freq: Every day | ORAL | 3 refills | Status: DC

## 2018-04-28 NOTE — Telephone Encounter (Signed)
[place], [place].    Patient needs a new prescription sent in to her new pharmacy listed above    levothyroxine (SYNTHROID, LEVOTHROID) 137 MCG tablet

## 2018-04-28 NOTE — Telephone Encounter (Signed)
RX sent

## 2018-05-07 ENCOUNTER — Telehealth: Payer: Self-pay | Admitting: Family Medicine

## 2018-05-07 NOTE — Telephone Encounter (Signed)
Copied from [place] [id number]. Topic: Quick Communication - See Telephone Encounter >> May 07, 2018  2:14 PM Blase Mess A wrote: CRM for notification. See Telephone encounter for: 05/07/18.  Patient's mother is calling because the patient is having hot flashes badly and wants to know if Dr. Deborra Medina could prescribe something. Please advise [place], [place]. [phone] (Phone) [phone] (Fax)

## 2018-05-08 NOTE — Telephone Encounter (Signed)
TA-Pt is having significant hot flashes and mother is wondering if there is anything that you would prescribe for this/plz advise/thx dmf

## 2018-05-08 NOTE — Telephone Encounter (Signed)
LMOVM for pt to call psych and get suggestions that would not interfere with current medications then call us back/thx dmf

## 2018-05-08 NOTE — Telephone Encounter (Signed)
Yes but please have her call her psychiatrist as it may interfere with/require adjusting her psych meds.

## 2018-05-12 ENCOUNTER — Ambulatory Visit (INDEPENDENT_AMBULATORY_CARE_PROVIDER_SITE_OTHER): Payer: Medicare Other | Admitting: Psychiatry

## 2018-05-12 ENCOUNTER — Encounter: Payer: Self-pay | Admitting: Psychiatry

## 2018-05-12 VITALS — BP 101/67 | HR 65

## 2018-05-12 DIAGNOSIS — F418 Other specified anxiety disorders: Secondary | ICD-10-CM | POA: Diagnosis not present

## 2018-05-12 DIAGNOSIS — F5101 Primary insomnia: Secondary | ICD-10-CM

## 2018-05-12 DIAGNOSIS — F319 Bipolar disorder, unspecified: Secondary | ICD-10-CM

## 2018-05-12 MED ORDER — SERTRALINE HCL 100 MG PO TABS: ORAL_TABLET | ORAL | 0 refills | Status: DC

## 2018-05-12 NOTE — Progress Notes (Signed)
Nancy Hall [id number] 01-07-1971 48 y.o.  Subjective:   Patient ID:  Nancy Hall is a 48 y.o. (DOB Sep 08, 1970) female.  Chief Complaint:  Chief Complaint  Patient presents with  . Medication Problem    Hot flashes/night sweats  . Depression  . Anxiety    HPI Nancy Hall presents to the office today for follow-up of depression, anxiety, and insomnia. She is accompanied by her mother. Pt's mother reports that she is "doing some better." Pt's mother reports that she is helping more around the house and now will help without mother having to ask for her help. Mother reports that pt 's irritability has improved and that pt continues to be mildly irritable at times. Pt reports that she has been missing her father who is deceased. Pt's mother reports that her anxiety has not been as bad. Continues to have anxiety when she is about to run out of her favorite foods, beverages, and toiletries. Mother reports that pt has set times that she has to eat and is rigid about this and wants to know what they are eating several meals in advance. He mother reports "she worries about everything."  Mother and pt report that she continues to not like going out to shop and run errands. Mother reports that in the past pt would want to go to the dollar store daily. Energy is low. Mother reports that pt goes to her bedroom right after dinner and pt reports that this is so she can watch what she wants to watch. Goes to bed at 8 pm and awakens 10 pm and occasionally takes awhile to return. Then sleeps until 6 pm. Denies SI.   Mother reports that pt continues to think that her nephew is going to try to do something to irritate her or go against her. They both deny any other paranoia.   Mother reports that pt will periodically get in a mood to clean or take a shower. Denies excessive cleaning. They deny any impulsive or risky behaviors.   Mother reports that pt c/o early satiety and that her stomach will hurt when  she eats and has been eating "junk food" and drinking sodas.    Pt's mother reports that pt has been having severe hot flashes at night and this is disrupting her sleep. They think that hot flashes started since last visit. Takes Sertraline in the morning.   Sang in church cantata. Pt's mother has been staying with her sister periodically. Mother reports that she continues to have health issues and that she is receiving other treatments and there are no immediate plans for surgery.   Past medication trials: Abilify-helpful for mood signs and symptoms. Had tremor and involuntary orofacial movements. Possible weight gain. Vraylar- Had tremor and oral facial movements Latuda-ineffective for mood signs and symptoms. No tolerability issues. Sertraline-has taken long-term. May have been helpful for depression. Lamictal-prescribed for seizures by neurologist. Patient has taken long-term and effect on mood is unclear. Topamax-taken long-term and was started for seizures. Depakote-prescribed for seizures in the past. Lithium- tremor, hot flashes, worsening mood.    Review of Systems:  Review of Systems  Constitutional: Positive for diaphoresis.  Gastrointestinal:       Reports that her stomach briefly hurts after eating.   Endocrine:       Hot flashes at night. Had hysterectomy 3 years ago.  Musculoskeletal: Negative for gait problem.  Skin: Negative for rash.       Itching all over body.  Denies rash  Neurological: Negative for tremors.  Psychiatric/Behavioral:       Please refer to HPI    Medications: I have reviewed the patient's current medications.  Current Outpatient Medications  Medication Sig Dispense Refill  . acetaminophen (TYLENOL) 325 MG tablet Take 650 mg by mouth every 6 (six) hours as needed.    . gabapentin (NEURONTIN) 300 MG capsule TAKE 1 CAPSULE BY MOUTH EVERYDAY AT BEDTIME 90 capsule 1  . lamoTRIgine (LAMICTAL) 150 MG tablet Take 1 tablet (150 mg total) by  mouth 2 (two) times daily. 180 tablet 3  . levothyroxine (SYNTHROID, LEVOTHROID) 137 MCG tablet Take 1 tablet (137 mcg total) by mouth daily before breakfast. 60 tablet 3  . sertraline (ZOLOFT) 100 MG tablet Take 1 tab po q am and 1/2 tab po q evening 135 tablet 0  . topiramate (TOPAMAX) 100 MG tablet One tablet in the morning and 1.5 tablets at night 225 tablet 3  . omeprazole (PRILOSEC) 40 MG capsule TAKE 1 CAPSULE (40 MG TOTAL) BY MOUTH DAILY. (Patient not taking: Reported on 05/12/2018) 90 capsule 0  . oxyCODONE (OXY IR/ROXICODONE) 5 MG immediate release tablet Take 1 tablet (5 mg total) by mouth every 6 (six) hours as needed for severe pain. (Patient not taking: Reported on 05/12/2018) 20 tablet 0   No current facility-administered medications for this visit.     Medication Side Effects: Other: Night sweats/hot flashes  Allergies:  Allergies  Allergen Reactions  . Penicillins Rash    Has patient had a PCN reaction causing immediate rash, facial/tongue/throat swelling, SOB or lightheadedness with hypotension: Yes Has patient had a PCN reaction causing severe rash involving mucus membranes or skin necrosis: No Has patient had a PCN reaction that required hospitalization: No Has patient had a PCN reaction occurring within the last 10 years: Yes If all of the above answers are "NO", then may proceed with Cephalosporin use.  3g Ancef administered 10/21/2017 without reaction/complic    Past Medical History:  Diagnosis Date  . Anemia 2016  . Anxiety   . Bipolar disorder ([place])   . Cancer ([place])   . Complication of anesthesia   . Depression   . Epigastric hernia   . Esophagitis    LA Class A  . GERD (gastroesophageal reflux disease)   . Hiatal hernia   . HOH (hard of hearing)   . Hypothyroidism   . Learning disability   . Obesity   . PONV (postoperative nausea and vomiting)   . Pseudoseizures   . Seizure ([place]) 2014   last seizure 2(two) years ago  . Seizures ([place])    according  to echart- seizures vs pseudoseizures  . Sleep apnea    No cpap  . Thyroid disease   . Ventral hernia     Family History  Problem Relation Age of Onset  . Colon polyps Mother   . Celiac disease Mother   . Depression Mother   . Anxiety disorder Mother   . Colon polyps Father   . Celiac disease Brother   . Diabetes Sister   . Depression Sister   . Depression Brother   . CVA Brother   . Diabetes Paternal Grandmother   . Heart disease Paternal Grandfather   . Colon polyps Paternal Aunt   . Diabetes Maternal Aunt   . Heart disease Maternal Grandfather   . Heart disease Maternal Uncle   . Heart disease Maternal Aunt   . Seizures Neg Hx     Social  History   Socioeconomic History  . Marital status: Single    Spouse name: Not on file  . Number of children: 0  . Years of education: 10  . Highest education level: Not on file  Occupational History  . Not on file  Social Needs  . Financial resource strain: Not on file  . Food insecurity:    Worry: Not on file    Inability: Not on file  . Transportation needs:    Medical: Not on file    Non-medical: Not on file  Tobacco Use  . Smoking status: Never Smoker  . Smokeless tobacco: Never Used  Substance and Sexual Activity  . Alcohol use: No    Alcohol/week: 0.0 standard drinks  . Drug use: No  . Sexual activity: Not Currently  Lifestyle  . Physical activity:    Days per week: Not on file    Minutes per session: Not on file  . Stress: Not on file  Relationships  . Social connections:    Talks on phone: Not on file    Gets together: Not on file    Attends religious service: Not on file    Active member of club or organization: Not on file    Attends meetings of clubs or organizations: Not on file    Relationship status: Not on file  . Intimate partner violence:    Fear of current or ex partner: Not on file    Emotionally abused: Not on file    Physically abused: Not on file    Forced sexual activity: Not on file   Other Topics Concern  . Not on file  Social History Narrative   Patient lives at home with mom.    Patient does not have any children.    Patient has a 10th grade education.    Patient is single.    Patient is left handed.    Does not have a living will or HPOA- full code.    Past Medical History, Surgical history, Social history, and Family history were reviewed and updated as appropriate.   Please see review of systems for further details on the patient's review from today.   Objective:   Physical Exam:  BP 101/67   Pulse 65   LMP 12/19/2014 (Exact Date)   Physical Exam Constitutional:      General: She is not in acute distress.    Appearance: She is well-developed.  Musculoskeletal:        General: No deformity.  Neurological:     Mental Status: She is alert and oriented to person, place, and time.     Coordination: Coordination normal.  Psychiatric:        Attention and Perception: Attention and perception normal. She does not perceive auditory or visual hallucinations.        Mood and Affect: Mood is anxious and depressed. Affect is not labile, blunt, angry or inappropriate.        Speech: Speech normal.        Behavior: Behavior normal.        Thought Content: Thought content normal. Thought content does not include homicidal or suicidal ideation. Thought content does not include homicidal or suicidal plan.        Cognition and Memory: Cognition and memory normal.        Judgment: Judgment normal.     Comments: Insight intact. No delusions.  More appropriate affect compared to recent exam and more engaged in exam.  Lab Review:     Component Value Date/Time   NA 139 10/13/2017 0901   K 3.8 10/13/2017 0901   CL 110 10/13/2017 0901   CO2 20 (L) 10/13/2017 0901   GLUCOSE 133 (H) 10/13/2017 0901   BUN 10 10/13/2017 0901   CREATININE 0.98 10/13/2017 0901   CALCIUM 8.5 (L) 10/13/2017 0901   PROT 6.4 07/29/2017 1220   ALBUMIN 3.9 07/29/2017 1220   AST 14  07/29/2017 1220   ALT 16 07/29/2017 1220   ALKPHOS 76 07/29/2017 1220   BILITOT 0.4 07/29/2017 1220   GFRNONAA >60 10/13/2017 0901   GFRAA >60 10/13/2017 0901       Component Value Date/Time   WBC 6.0 10/13/2017 0901   RBC 3.89 10/13/2017 0901   HGB 11.6 (L) 10/13/2017 0901   HGB 12.3 11/03/2014 1100   HCT 37.6 10/13/2017 0901   HCT 35.7 11/03/2014 1100   PLT 176 10/13/2017 0901   MCV 96.7 10/13/2017 0901   MCH 29.8 10/13/2017 0901   MCHC 30.9 10/13/2017 0901   RDW 14.3 10/13/2017 0901   LYMPHSABS 1.4 12/19/2016 1400   MONOABS 0.3 12/19/2016 1400   EOSABS 0.5 12/19/2016 1400   BASOSABS 0.0 12/19/2016 1400    No results found for: POCLITH, LITHIUM   No results found for: PHENYTOIN, PHENOBARB, VALPROATE, CBMZ   .res Assessment: Plan:   Case staffed with Dr. Clovis Pu who recommends trying divided dose of Sertraline to possibly decrease side effect of night sweats. Pt agrees to taking Sertraline 100 mg po q am and 50 mg po qd later in the day.  Patient advised to contact office with any questions, adverse effects, or acute worsening in signs and symptoms.  If side effects do not improve and/or s/s are not well controlled may consider Lexapro or Trintellix in the future since these medications would be less likely to trigger night sweats compared to some other antidepressants.    Bipolar depression ([place]) - Plan: sertraline (ZOLOFT) 100 MG tablet  Primary insomnia  Other specified anxiety disorders - Plan: sertraline (ZOLOFT) 100 MG tablet  Please see After Visit Summary for patient specific instructions.  Future Appointments  Date Time Provider [place]  06/09/2018  9:30 AM Thayer Headings, PMHNP [facility] None  12/21/2018 10:30 AM Ward Givens, NP [facility] None  01/27/2019 11:00 AM Dennis Bast, RN [facility]  02/23/2019 10:00 AM Philemon Kingdom, MD [facility] None    No orders of the defined types were placed in this encounter.      -------------------------------

## 2018-05-14 ENCOUNTER — Encounter (INDEPENDENT_AMBULATORY_CARE_PROVIDER_SITE_OTHER): Payer: Self-pay

## 2018-05-18 ENCOUNTER — Other Ambulatory Visit: Payer: Self-pay | Admitting: Family Medicine

## 2018-05-18 MED ORDER — GABAPENTIN 300 MG PO CAPS: ORAL_CAPSULE | ORAL | 1 refills | Status: DC

## 2018-05-18 NOTE — Telephone Encounter (Signed)
Requested Prescriptions  Pending Prescriptions Disp Refills  . gabapentin (NEURONTIN) 300 MG capsule 90 capsule 1    Sig: TAKE 1 CAPSULE BY MOUTH EVERYDAY AT BEDTIME     Neurology: Anticonvulsants - gabapentin Passed - 05/18/2018 11:56 AM      Passed - Valid encounter within last 12 months    Recent Outpatient Visits          1 month ago Left hand pain   [facility], Marciano Sequin, MD   4 months ago Dupuytren's contracture of left hand   [facility], Marciano Sequin, MD   5 months ago Sprain of metacarpophalangeal (MCP) joint of left index finger, initial encounter   [facility] [place], Mortimer Fries, MD   8 months ago Rash and nonspecific skin eruption   [facility] [address], [place] M, MD   9 months ago Low back pain radiating to left leg   [facility] [place] Rosemarie Ax, MD      Future Appointments            In 8 months Vevelyn Royals, Parthenia Ames, RN [facility] [place], [place]

## 2018-05-18 NOTE — Telephone Encounter (Signed)
Copied from [place] [phone]. Topic: Quick Communication - Rx Refill/Question >> May 18, 2018 11:39 AM Virl Axe D wrote: Medication: gabapentin (NEURONTIN) 300 MG capsule / Pt has a new pharmacy and they instructed pt to have PCP send new rx to pharmacy to fill.  Has the patient contacted their pharmacy? Yes.   (Agent: If no, request that the patient contact the pharmacy for the refill.) (Agent: If yes, when and what did the pharmacy advise?)  Preferred Pharmacy (with phone number or street name): [facility], [place]. [phone] (Phone) [phone] (Fax)  Agent: Please be advised that RX refills may take up to 3 business days. We ask that you follow-up with your pharmacy.

## 2018-05-21 ENCOUNTER — Ambulatory Visit (INDEPENDENT_AMBULATORY_CARE_PROVIDER_SITE_OTHER): Payer: Self-pay | Admitting: Family Medicine

## 2018-05-28 DIAGNOSIS — L821 Other seborrheic keratosis: Secondary | ICD-10-CM | POA: Diagnosis not present

## 2018-05-28 DIAGNOSIS — Z808 Family history of malignant neoplasm of other organs or systems: Secondary | ICD-10-CM | POA: Diagnosis not present

## 2018-06-04 ENCOUNTER — Ambulatory Visit (INDEPENDENT_AMBULATORY_CARE_PROVIDER_SITE_OTHER): Payer: Self-pay | Admitting: Family Medicine

## 2018-06-09 ENCOUNTER — Encounter: Payer: Self-pay | Admitting: Family Medicine

## 2018-06-09 ENCOUNTER — Ambulatory Visit (INDEPENDENT_AMBULATORY_CARE_PROVIDER_SITE_OTHER): Payer: Medicare Other | Admitting: Family Medicine

## 2018-06-09 ENCOUNTER — Ambulatory Visit (INDEPENDENT_AMBULATORY_CARE_PROVIDER_SITE_OTHER): Payer: Medicare Other | Admitting: Psychiatry

## 2018-06-09 ENCOUNTER — Encounter: Payer: Self-pay | Admitting: Psychiatry

## 2018-06-09 VITALS — BP 120/70 | HR 72 | Temp 97.7°F | Ht 66.0 in | Wt 287.4 lb

## 2018-06-09 VITALS — Wt 286.0 lb

## 2018-06-09 DIAGNOSIS — R21 Rash and other nonspecific skin eruption: Secondary | ICD-10-CM

## 2018-06-09 DIAGNOSIS — F319 Bipolar disorder, unspecified: Secondary | ICD-10-CM

## 2018-06-09 DIAGNOSIS — F418 Other specified anxiety disorders: Secondary | ICD-10-CM | POA: Diagnosis not present

## 2018-06-09 DIAGNOSIS — F5101 Primary insomnia: Secondary | ICD-10-CM

## 2018-06-09 MED ORDER — DOXYCYCLINE HYCLATE 100 MG PO TABS: 100.0000 mg | ORAL_TABLET | Freq: Two times a day (BID) | ORAL | 0 refills | Status: DC

## 2018-06-09 MED ORDER — FLUOCINONIDE-E 0.05 % EX CREA: 1.0000 "application " | TOPICAL_CREAM | Freq: Two times a day (BID) | CUTANEOUS | 0 refills | Status: DC

## 2018-06-09 MED ORDER — ESCITALOPRAM OXALATE 20 MG PO TABS: ORAL_TABLET | ORAL | 1 refills | Status: DC

## 2018-06-09 MED ORDER — SERTRALINE HCL 100 MG PO TABS: ORAL_TABLET | ORAL | 0 refills | Status: DC

## 2018-06-09 NOTE — Patient Instructions (Signed)
Great to see you.  Take doxycyline as directed- 1 tablet twice daily for 7 days. Use lidex cream twice daily.  Call me in 10 days or so, sooner if your symptoms worsen.

## 2018-06-09 NOTE — Progress Notes (Signed)
Subjective:   Patient ID: [name], female    DOB: February 15, 1971, 48 y.o.   MRN: [id number]  GABREILLE DARDIS is a pleasant 48 y.o. year old female who presents to clinic today with Rash (rash on bottom of both legs and right arm, itchy and painful, hasn't changed anything that she knows of./ pt thinks its spreading/1 month happening/ OTC benedryl)  on 06/09/2018  HPI:  Itchy rash on legs and now right wrist. Started on her ankles about a month ago.  Initially felt they were some sort of bite or reaction to one of her pysch medications.  Saw her psychiatrist this morning- weaning off of zoloft and starting lexapro but she is unsure why- will await her psychiatrist's notes.  Benadryl helps a little.  No pets.  No one else in house has similar symptoms.  Rash does not involve waist or areas between fingers or toes.   Current Outpatient Medications on File Prior to Visit  Medication Sig Dispense Refill  . escitalopram (LEXAPRO) 20 MG tablet Take 1/2 tab po qd x 1 week, then increase to 1 tab po qd 30 tablet 1  . gabapentin (NEURONTIN) 300 MG capsule TAKE 1 CAPSULE BY MOUTH EVERYDAY AT BEDTIME 90 capsule 1  . lamoTRIgine (LAMICTAL) 150 MG tablet Take 1 tablet (150 mg total) by mouth 2 (two) times daily. 180 tablet 3  . levothyroxine (SYNTHROID, LEVOTHROID) 137 MCG tablet Take 1 tablet (137 mcg total) by mouth daily before breakfast. 60 tablet 3  . omeprazole (PRILOSEC) 40 MG capsule TAKE 1 CAPSULE (40 MG TOTAL) BY MOUTH DAILY. 90 capsule 0  . oxyCODONE (OXY IR/ROXICODONE) 5 MG immediate release tablet Take 1 tablet (5 mg total) by mouth every 6 (six) hours as needed for severe pain. 20 tablet 0  . topiramate (TOPAMAX) 100 MG tablet One tablet in the morning and 1.5 tablets at night 225 tablet 3  . acetaminophen (TYLENOL) 325 MG tablet Take 650 mg by mouth every 6 (six) hours as needed.    . sertraline (ZOLOFT) 100 MG tablet Take 1 tab daily for 1 week, then 1/2 tablet daily for one week,  then stop. (Patient not taking: Reported on 06/09/2018) 135 tablet 0   No current facility-administered medications on file prior to visit.     Allergies  Allergen Reactions  . Penicillins Rash    Has patient had a PCN reaction causing immediate rash, facial/tongue/throat swelling, SOB or lightheadedness with hypotension: Yes Has patient had a PCN reaction causing severe rash involving mucus membranes or skin necrosis: No Has patient had a PCN reaction that required hospitalization: No Has patient had a PCN reaction occurring within the last 10 years: Yes If all of the above answers are "NO", then may proceed with Cephalosporin use.  3g Ancef administered 10/21/2017 without reaction/complic    Past Medical History:  Diagnosis Date  . Anemia 2016  . Anxiety   . Bipolar disorder ([place])   . Cancer ([place])   . Complication of anesthesia   . Depression   . Epigastric hernia   . Esophagitis    LA Class A  . GERD (gastroesophageal reflux disease)   . Hiatal hernia   . HOH (hard of hearing)   . Hypothyroidism   . Learning disability   . Obesity   . PONV (postoperative nausea and vomiting)   . Pseudoseizures   . Seizure ([place]) 2014   last seizure 2(two) years ago  . Seizures ([place])  according to echart- seizures vs pseudoseizures  . Sleep apnea    No cpap  . Thyroid disease   . Ventral hernia     Past Surgical History:  Procedure Laterality Date  . ABDOMINAL HYSTERECTOMY N/A 01/09/2015   Procedure: HYSTERECTOMY ABDOMINAL/BILATERAL SALPINGECTOMY;  Surgeon: Rubie Maid, MD;  Location: [facility];  Service: Gynecology;  Laterality: N/A;  . DILATION AND CURETTAGE OF UTERUS    . HYSTEROSCOPY W/D&C N/A 12/19/2014   Procedure: DILATATION AND CURETTAGE /HYSTEROSCOPY;  Surgeon: Rubie Maid, MD;  Location: [facility];  Service: Gynecology;  Laterality: N/A;  . INNER EAR SURGERY Bilateral 1994   poor historian  . INSERTION OF MESH N/A 10/21/2017   Procedure: INSERTION OF MESH;  Surgeon:  Donnie Mesa, MD;  Location: [place];  Service: General;  Laterality: N/A;  . TONSILLECTOMY    . VENTRAL HERNIA REPAIR N/A 10/21/2017   Procedure: OPEN REPAIR EPIGASTIC VENTRAL HERNIA WITH  MESH ERAS PATHWAY;  Surgeon: Donnie Mesa, MD;  Location: [place];  Service: General;  Laterality: N/A;    Family History  Problem Relation Age of Onset  . Colon polyps Mother   . Celiac disease Mother   . Depression Mother   . Anxiety disorder Mother   . Colon polyps Father   . Celiac disease Brother   . Diabetes Sister   . Depression Sister   . Depression Brother   . CVA Brother   . Diabetes Paternal Grandmother   . Heart disease Paternal Grandfather   . Colon polyps Paternal Aunt   . Diabetes Maternal Aunt   . Heart disease Maternal Grandfather   . Heart disease Maternal Uncle   . Heart disease Maternal Aunt   . Seizures Neg Hx     Social History   Socioeconomic History  . Marital status: Single    Spouse name: Not on file  . Number of children: 0  . Years of education: 10  . Highest education level: Not on file  Occupational History  . Not on file  Social Needs  . Financial resource strain: Not on file  . Food insecurity:    Worry: Not on file    Inability: Not on file  . Transportation needs:    Medical: Not on file    Non-medical: Not on file  Tobacco Use  . Smoking status: Never Smoker  . Smokeless tobacco: Never Used  Substance and Sexual Activity  . Alcohol use: No    Alcohol/week: 0.0 standard drinks  . Drug use: No  . Sexual activity: Not Currently  Lifestyle  . Physical activity:    Days per week: Not on file    Minutes per session: Not on file  . Stress: Not on file  Relationships  . Social connections:    Talks on phone: Not on file    Gets together: Not on file    Attends religious service: Not on file    Active member of club or organization: Not on file    Attends meetings of clubs or organizations: Not on file    Relationship status: Not on file    . Intimate partner violence:    Fear of current or ex partner: Not on file    Emotionally abused: Not on file    Physically abused: Not on file    Forced sexual activity: Not on file  Other Topics Concern  . Not on file  Social History Narrative   Patient lives at home with mom.  Patient does not have any children.    Patient has a 10th grade education.    Patient is single.    Patient is left handed.    Does not have a living will or HPOA- full code.   The PMH, PSH, Social History, Family History, Medications, and allergies have been reviewed in [facility], and have been updated if relevant.   Review of Systems  Constitutional: Negative.   Skin: Positive for rash.  Neurological: Negative.   All other systems reviewed and are negative.      Objective:    BP 120/70   Pulse 72   Temp 97.7 F (36.5 C) (Oral)   Ht 5\' 6"  (1.676 m)   Wt 287 lb 6.4 oz (130.4 kg)   LMP 12/19/2014 (Exact Date)   SpO2 99%   BMI 46.39 kg/m    Physical Exam Vitals signs and nursing note reviewed.  Constitutional:      General: She is not in acute distress.    Appearance: Normal appearance. She is obese. She is not toxic-appearing.  HENT:     Head: Normocephalic.     Right Ear: External ear normal.     Left Ear: External ear normal.     Nose: Nose normal.     Mouth/Throat:     Mouth: Mucous membranes are moist.  Eyes:     Extraocular Movements: Extraocular movements intact.  Cardiovascular:     Rate and Rhythm: Normal rate.  Pulmonary:     Effort: Pulmonary effort is normal.  Musculoskeletal: Normal range of motion.  Skin:      Neurological:     General: No focal deficit present.     Mental Status: She is alert and oriented to person, place, and time.  Psychiatric:        Mood and Affect: Mood normal.        Behavior: Behavior normal.           Assessment & Plan:   Rash and nonspecific skin eruption No follow-ups on file.

## 2018-06-09 NOTE — Progress Notes (Signed)
Nancy Hall [id number] December 31, 1970 48 y.o.  Subjective:   Patient ID:  Nancy Hall is a 48 y.o. (DOB 1970-11-05) female.  Chief Complaint:  Chief Complaint  Patient presents with  . Depression  . Anxiety    HPI Nancy Hall presents to the office today for follow-up of anxiety and depression. She is accompanied by her mother. Mother reports that she has not seen any improvements with Sertraline. Mother reports that pt's mood has been "grouchy." Mother reports that pt conitnues to have conflict with her nephew. Mother reports that pt is now sleeping on the couch all night. Pt denies difficulty falling or staying asleep. Mother reports that pt is sleeping excessively during the day. Denies anxiety about sleeping or nightmares. Reports anxiety is "about the same" and has anxiety about running out of certain items. They report that she continues to not want to leave the house. Mother reports that pt's motivation is lower compared to the past and that when she asked her to do something she will not do it immediately. Energy is low. They deny any recent manic s/s. Denies paranoia. Denies SI.   Continues to sing in the choir.    Past medication trials: Abilify-helpful for mood signs and symptoms. Had tremor and involuntary orofacial movements. Possible weight gain. Vraylar- Had tremor and oral facial movements Latuda-ineffective for mood signs and symptoms. No tolerability issues. Sertraline-has taken long-term. May have been helpful for depression. Lamictal-prescribed for seizures by neurologist. Patient has taken long-term and effect on mood is unclear. Topamax-taken long-term and was started for seizures. Depakote-prescribed for seizures in the past. Lithium- tremor, hot flashes, worsening mood.  Review of Systems:  Review of Systems  Musculoskeletal: Negative for gait problem.  Skin:       Skin lesions on wrist and ankles. She c/o itching all over her body. Has been  itching for 3 months and that it "will get better and then get worse."   Neurological: Positive for tremors.       They report occasional mild hand tremor  Psychiatric/Behavioral:       Please refer to HPI    Medications: I have reviewed the patient's current medications.  Current Outpatient Medications  Medication Sig Dispense Refill  . acetaminophen (TYLENOL) 325 MG tablet Take 650 mg by mouth every 6 (six) hours as needed.    . gabapentin (NEURONTIN) 300 MG capsule TAKE 1 CAPSULE BY MOUTH EVERYDAY AT BEDTIME 90 capsule 1  . lamoTRIgine (LAMICTAL) 150 MG tablet Take 1 tablet (150 mg total) by mouth 2 (two) times daily. 180 tablet 3  . levothyroxine (SYNTHROID, LEVOTHROID) 137 MCG tablet Take 1 tablet (137 mcg total) by mouth daily before breakfast. 60 tablet 3  . sertraline (ZOLOFT) 100 MG tablet Take 1 tab po q am and 1/2 tab po q evening 135 tablet 0  . topiramate (TOPAMAX) 100 MG tablet One tablet in the morning and 1.5 tablets at night 225 tablet 3  . omeprazole (PRILOSEC) 40 MG capsule TAKE 1 CAPSULE (40 MG TOTAL) BY MOUTH DAILY. (Patient not taking: Reported on 05/12/2018) 90 capsule 0  . oxyCODONE (OXY IR/ROXICODONE) 5 MG immediate release tablet Take 1 tablet (5 mg total) by mouth every 6 (six) hours as needed for severe pain. (Patient not taking: Reported on 05/12/2018) 20 tablet 0   No current facility-administered medications for this visit.     Medication Side Effects: Other: Possible night sweats  Allergies:  Allergies  Allergen Reactions  . Penicillins  Rash    Has patient had a PCN reaction causing immediate rash, facial/tongue/throat swelling, SOB or lightheadedness with hypotension: Yes Has patient had a PCN reaction causing severe rash involving mucus membranes or skin necrosis: No Has patient had a PCN reaction that required hospitalization: No Has patient had a PCN reaction occurring within the last 10 years: Yes If all of the above answers are "NO", then may  proceed with Cephalosporin use.  3g Ancef administered 10/21/2017 without reaction/complic    Past Medical History:  Diagnosis Date  . Anemia 2016  . Anxiety   . Bipolar disorder ([place])   . Cancer ([place])   . Complication of anesthesia   . Depression   . Epigastric hernia   . Esophagitis    LA Class A  . GERD (gastroesophageal reflux disease)   . Hiatal hernia   . HOH (hard of hearing)   . Hypothyroidism   . Learning disability   . Obesity   . PONV (postoperative nausea and vomiting)   . Pseudoseizures   . Seizure ([place]) 2014   last seizure 2(two) years ago  . Seizures ([place])    according to echart- seizures vs pseudoseizures  . Sleep apnea    No cpap  . Thyroid disease   . Ventral hernia     Family History  Problem Relation Age of Onset  . Colon polyps Mother   . Celiac disease Mother   . Depression Mother   . Anxiety disorder Mother   . Colon polyps Father   . Celiac disease Brother   . Diabetes Sister   . Depression Sister   . Depression Brother   . CVA Brother   . Diabetes Paternal Grandmother   . Heart disease Paternal Grandfather   . Colon polyps Paternal Aunt   . Diabetes Maternal Aunt   . Heart disease Maternal Grandfather   . Heart disease Maternal Uncle   . Heart disease Maternal Aunt   . Seizures Neg Hx     Social History   Socioeconomic History  . Marital status: Single    Spouse name: Not on file  . Number of children: 0  . Years of education: 10  . Highest education level: Not on file  Occupational History  . Not on file  Social Needs  . Financial resource strain: Not on file  . Food insecurity:    Worry: Not on file    Inability: Not on file  . Transportation needs:    Medical: Not on file    Non-medical: Not on file  Tobacco Use  . Smoking status: Never Smoker  . Smokeless tobacco: Never Used  Substance and Sexual Activity  . Alcohol use: No    Alcohol/week: 0.0 standard drinks  . Drug use: No  . Sexual activity: Not  Currently  Lifestyle  . Physical activity:    Days per week: Not on file    Minutes per session: Not on file  . Stress: Not on file  Relationships  . Social connections:    Talks on phone: Not on file    Gets together: Not on file    Attends religious service: Not on file    Active member of club or organization: Not on file    Attends meetings of clubs or organizations: Not on file    Relationship status: Not on file  . Intimate partner violence:    Fear of current or ex partner: Not on file    Emotionally abused: Not on  file    Physically abused: Not on file    Forced sexual activity: Not on file  Other Topics Concern  . Not on file  Social History Narrative   Patient lives at home with mom.    Patient does not have any children.    Patient has a 10th grade education.    Patient is single.    Patient is left handed.    Does not have a living will or HPOA- full code.    Past Medical History, Surgical history, Social history, and Family history were reviewed and updated as appropriate.   Please see review of systems for further details on the patient's review from today.   Objective:   Physical Exam:  Wt 286 lb (129.7 kg)   LMP 12/19/2014 (Exact Date)   BMI 46.16 kg/m   Physical Exam Constitutional:      General: She is not in acute distress.    Appearance: She is well-developed.  Musculoskeletal:        General: No deformity.  Neurological:     Mental Status: She is alert and oriented to person, place, and time.     Coordination: Coordination normal.  Psychiatric:        Attention and Perception: Perception normal. She does not perceive auditory or visual hallucinations.        Mood and Affect: Mood is depressed. Mood is not anxious. Affect is blunt. Affect is not labile, angry or inappropriate.        Speech: Speech normal.        Behavior: Behavior is slowed and withdrawn.        Thought Content: Thought content normal. Thought content does not include  homicidal or suicidal ideation. Thought content does not include homicidal or suicidal plan.        Cognition and Memory: Cognition and memory normal.        Judgment: Judgment normal.     Comments: Insight intact. No delusions.      Lab Review:     Component Value Date/Time   NA 139 10/13/2017 0901   K 3.8 10/13/2017 0901   CL 110 10/13/2017 0901   CO2 20 (L) 10/13/2017 0901   GLUCOSE 133 (H) 10/13/2017 0901   BUN 10 10/13/2017 0901   CREATININE 0.98 10/13/2017 0901   CALCIUM 8.5 (L) 10/13/2017 0901   PROT 6.4 07/29/2017 1220   ALBUMIN 3.9 07/29/2017 1220   AST 14 07/29/2017 1220   ALT 16 07/29/2017 1220   ALKPHOS 76 07/29/2017 1220   BILITOT 0.4 07/29/2017 1220   GFRNONAA >60 10/13/2017 0901   GFRAA >60 10/13/2017 0901       Component Value Date/Time   WBC 6.0 10/13/2017 0901   RBC 3.89 10/13/2017 0901   HGB 11.6 (L) 10/13/2017 0901   HGB 12.3 11/03/2014 1100   HCT 37.6 10/13/2017 0901   HCT 35.7 11/03/2014 1100   PLT 176 10/13/2017 0901   MCV 96.7 10/13/2017 0901   MCH 29.8 10/13/2017 0901   MCHC 30.9 10/13/2017 0901   RDW 14.3 10/13/2017 0901   LYMPHSABS 1.4 12/19/2016 1400   MONOABS 0.3 12/19/2016 1400   EOSABS 0.5 12/19/2016 1400   BASOSABS 0.0 12/19/2016 1400    No results found for: POCLITH, LITHIUM   No results found for: PHENYTOIN, PHENOBARB, VALPROATE, CBMZ   .res Assessment: Plan:   Discussed potential benefits, risks, and side effects of switching from Sertraline to Lexapro since Sertraline no longer seems to be effective. Discussed  that SSRI's can stop becoming effective after years of tx and they report that she has taken Sertraline for approximately 20 years.  Will decrease Sertraline to 100 mg po qd x 1 week, then decrease to 50 mg po qd x 1 week, then d/c. Will start Lexapro 10 mg po qd x 1 week, then increase to 20 mg po qd for depression and anxiety.   Patient advised to contact office with any questions, adverse effects, or acute  worsening in signs and symptoms.    Bipolar depression ([place])  Other specified anxiety disorders  Primary insomnia  Please see After Visit Summary for patient specific instructions.  Future Appointments  Date Time Provider [place]  06/09/2018 11:40 AM Lucille Passy, MD [facility]  12/21/2018 10:30 AM Ward Givens, NP [facility] None  01/27/2019 11:00 AM Dennis Bast, RN [facility]  02/23/2019 10:00 AM Philemon Kingdom, MD [facility] None    No orders of the defined types were placed in this encounter.     -------------------------------

## 2018-06-09 NOTE — Assessment & Plan Note (Addendum)
>  25 minutes spent in face to face time with patient, >50% spent in counselling or coordination of care Etiology unclear at this point- not classic distribution for scabies. Could be bug bites or something allergic (no other changes to her medications) or psychogenic but at this point, lesions appear infected.  Will treat with a course of doxycyline 100 mg twice daily x 7 days (PCN allergic), have her apply lidex to areas twice daily.  Advised looking for bed bugs, washing/bleaching sheets. If no improvement in 10 days, refer to dermatology. The patient indicates understanding of these issues and agrees with the plan.

## 2018-06-24 DIAGNOSIS — H6983 Other specified disorders of Eustachian tube, bilateral: Secondary | ICD-10-CM | POA: Diagnosis not present

## 2018-06-24 DIAGNOSIS — H903 Sensorineural hearing loss, bilateral: Secondary | ICD-10-CM | POA: Diagnosis not present

## 2018-07-01 ENCOUNTER — Other Ambulatory Visit: Payer: Self-pay | Admitting: Family Medicine

## 2018-07-01 DIAGNOSIS — R21 Rash and other nonspecific skin eruption: Secondary | ICD-10-CM

## 2018-07-01 MED ORDER — FLUOCINONIDE-E 0.05 % EX CREA: 1.0000 "application " | TOPICAL_CREAM | Freq: Two times a day (BID) | CUTANEOUS | 0 refills | Status: DC

## 2018-07-01 NOTE — Telephone Encounter (Signed)
Rx refill requested. Sent to Dr. Deborra Medina to approve Rx .

## 2018-07-01 NOTE — Telephone Encounter (Signed)
Please advise on refill request

## 2018-07-09 ENCOUNTER — Other Ambulatory Visit: Payer: Self-pay

## 2018-07-09 ENCOUNTER — Ambulatory Visit (INDEPENDENT_AMBULATORY_CARE_PROVIDER_SITE_OTHER): Payer: Medicare Other

## 2018-07-09 ENCOUNTER — Telehealth: Payer: Self-pay

## 2018-07-09 ENCOUNTER — Ambulatory Visit: Payer: Self-pay

## 2018-07-09 ENCOUNTER — Encounter: Payer: Self-pay | Admitting: Family Medicine

## 2018-07-09 ENCOUNTER — Ambulatory Visit (INDEPENDENT_AMBULATORY_CARE_PROVIDER_SITE_OTHER): Payer: Medicare Other | Admitting: Family Medicine

## 2018-07-09 ENCOUNTER — Other Ambulatory Visit: Payer: Self-pay | Admitting: Physician Assistant

## 2018-07-09 ENCOUNTER — Telehealth: Payer: Self-pay | Admitting: Psychiatry

## 2018-07-09 VITALS — BP 126/84 | HR 83 | Temp 98.0°F | Ht 66.0 in | Wt 288.8 lb

## 2018-07-09 DIAGNOSIS — M25531 Pain in right wrist: Secondary | ICD-10-CM

## 2018-07-09 MED ORDER — ESCITALOPRAM OXALATE 20 MG PO TABS: 30.0000 mg | ORAL_TABLET | Freq: Every day | ORAL | 0 refills | Status: DC

## 2018-07-09 NOTE — Telephone Encounter (Signed)
Questions for Screening COVID-19  Symptom onset: n/a  Travel or Contacts: no travel, no contact  During this illness, did/does the patient experience any of the following symptoms? Fever >100.66F []   Yes [x]   No []   Unknown Subjective fever (felt feverish) []   Yes [x]   No []   Unknown Chills []   Yes [x]   No []   Unknown Muscle aches (myalgia) []   Yes [x]   No []   Unknown Runny nose (rhinorrhea) []   Yes [x]   No []   Unknown Sore throat []   Yes [x]   No []   Unknown Cough (new onset or worsening of chronic cough) []   Yes [x]   No []   Unknown Shortness of breath (dyspnea) []   Yes [x]   No []   Unknown Nausea or vomiting []   Yes [x]   No []   Unknown Headache []   Yes [x]   No []   Unknown Abdominal pain  []   Yes [x]   No []   Unknown Diarrhea (?3 loose/looser than normal stools/24hr period) []   Yes [x]   No []   Unknown Other, specify:  Patient risk factors: Smoker? []   Current []   Former []   Never If female, currently pregnant? []   Yes []   No  Patient Active Problem List   Diagnosis Date Noted  . Rash and nonspecific skin eruption 06/09/2018  . Left hand pain 03/30/2018  . Tremor 03/30/2018  . 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 Atypical mole 12/29/2017  . Constipation 03/04/2017  . Hot flashes 02/24/2017  . Sciatic leg pain 07/10/2016  . Morbid obesity ([place]) 04/05/2014  . Hyperglycemia 09/21/2013  . Dyslipidemia 09/14/2013  . 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 Hypothyroidism 05/30/2011  . Depression, major, recurrent, severe with psychosis ([place]) 05/30/2011  . MR (mental retardation) 05/30/2011  . Bipolar disorder ([place]) 05/30/2011  . Sleep apnea 05/30/2011  . Seizures ([place])     Plan:  []   High risk for COVID-19 with red flags go to ED (with CP, SOB, weak/lightheaded, or fever > 101.5). Call ahead.  []   High risk for COVID-19 but stable. Inform provider and coordinate time for [facility] visit.   []   No red  flags but URI signs or symptoms okay for [facility] visit.

## 2018-07-09 NOTE — Telephone Encounter (Signed)
Per mother, Nancy Hall gets real upset and demanding with them. Still having depression and is more outspoken and loud- although she is not usually that way.  May need a medication adjustment. Notices this change over last couple weeks.  She is laying on the couch all the time. Mother makes sure she takes her meds.

## 2018-07-09 NOTE — Assessment & Plan Note (Signed)
Don't appreciate a fracture. Possible strain of flexor tendons. Unclear of the mechanism in how she landed.  - placed in wrist brace  - counseled on supportive care and HEP  - if no improvement consider imaging.

## 2018-07-09 NOTE — Telephone Encounter (Signed)
See note

## 2018-07-09 NOTE — Progress Notes (Signed)
Nancy Hall - 48 y.o. female MRN [id number]  Date of birth: [birth year]  SUBJECTIVE:  Including CC & ROS.  Chief Complaint  Patient presents with  . Pain    fell caught herself with right arm    Nancy Hall is a 48 y.o. female that is  Presenting with ulnar sided wrist pain. She fell down today. She is unsure if she landed on her wrist. She is having pain with movement. Has some bruising on the volar aspect on the wrist. Denies numbness or tingling. No history of surgery. Pain worse with ulnar deviation. Pain is moderate to severe. Localized to the lateral wrist.    Review of Systems  Constitutional: Negative for fever.  HENT: Negative for congestion.   Respiratory: Negative for cough.   Cardiovascular: Negative for chest pain.  Gastrointestinal: Negative for abdominal pain.  Musculoskeletal: Negative for gait problem.  Skin: Negative for color change.  Neurological: Negative for weakness.  Hematological: Negative for adenopathy.  Psychiatric/Behavioral: Negative for agitation.    HISTORY: Past Medical, Surgical, Social, and Family History Reviewed & Updated per EMR.   Pertinent Historical Findings include:  Past Medical History:  Diagnosis Date  . Anemia 2016  . Anxiety   . Bipolar disorder ([place])   . Cancer ([place])   . Complication of anesthesia   . Depression   . Epigastric hernia   . Esophagitis    LA Class A  . GERD (gastroesophageal reflux disease)   . Hiatal hernia   . HOH (hard of hearing)   . Hypothyroidism   . Learning disability   . Obesity   . PONV (postoperative nausea and vomiting)   . Pseudoseizures   . Seizure ([place]) 2014   last seizure 2(two) years ago  . Seizures ([place])    according to echart- seizures vs pseudoseizures  . Sleep apnea    No cpap  . Thyroid disease   . Ventral hernia     Past Surgical History:  Procedure Laterality Date  . ABDOMINAL HYSTERECTOMY N/A 01/09/2015   Procedure: HYSTERECTOMY ABDOMINAL/BILATERAL SALPINGECTOMY;   Surgeon: Rubie Maid, MD;  Location: [facility];  Service: Gynecology;  Laterality: N/A;  . DILATION AND CURETTAGE OF UTERUS    . HYSTEROSCOPY W/D&C N/A 12/19/2014   Procedure: DILATATION AND CURETTAGE /HYSTEROSCOPY;  Surgeon: Rubie Maid, MD;  Location: [facility];  Service: Gynecology;  Laterality: N/A;  . INNER EAR SURGERY Bilateral 1994   poor historian  . INSERTION OF MESH N/A 10/21/2017   Procedure: INSERTION OF MESH;  Surgeon: Donnie Mesa, MD;  Location: [place];  Service: General;  Laterality: N/A;  . TONSILLECTOMY    . VENTRAL HERNIA REPAIR N/A 10/21/2017   Procedure: OPEN REPAIR EPIGASTIC VENTRAL HERNIA WITH  MESH ERAS PATHWAY;  Surgeon: Donnie Mesa, MD;  Location: [place];  Service: General;  Laterality: N/A;    Allergies  Allergen Reactions  . Penicillins Rash    Has patient had a PCN reaction causing immediate rash, facial/tongue/throat swelling, SOB or lightheadedness with hypotension: Yes Has patient had a PCN reaction causing severe rash involving mucus membranes or skin necrosis: No Has patient had a PCN reaction that required hospitalization: No Has patient had a PCN reaction occurring within the last 10 years: Yes If all of the above answers are "NO", then may proceed with Cephalosporin use.  3g Ancef administered 10/21/2017 without reaction/complic    Family History  Problem Relation Age of Onset  . Colon polyps Mother   . Celiac disease  Mother   . Depression Mother   . Anxiety disorder Mother   . Colon polyps Father   . Celiac disease Brother   . Diabetes Sister   . Depression Sister   . Depression Brother   . CVA Brother   . Diabetes Paternal Grandmother   . Heart disease Paternal Grandfather   . Colon polyps Paternal Aunt   . Diabetes Maternal Aunt   . Heart disease Maternal Grandfather   . Heart disease Maternal Uncle   . Heart disease Maternal Aunt   . Seizures Neg Hx      Social History   Socioeconomic History  . Marital status: Single     Spouse name: Not on file  . Number of children: 0  . Years of education: 10  . Highest education level: Not on file  Occupational History  . Not on file  Social Needs  . Financial resource strain: Not on file  . Food insecurity:    Worry: Not on file    Inability: Not on file  . Transportation needs:    Medical: Not on file    Non-medical: Not on file  Tobacco Use  . Smoking status: Never Smoker  . Smokeless tobacco: Never Used  Substance and Sexual Activity  . Alcohol use: No    Alcohol/week: 0.0 standard drinks  . Drug use: No  . Sexual activity: Not Currently  Lifestyle  . Physical activity:    Days per week: Not on file    Minutes per session: Not on file  . Stress: Not on file  Relationships  . Social connections:    Talks on phone: Not on file    Gets together: Not on file    Attends religious service: Not on file    Active member of club or organization: Not on file    Attends meetings of clubs or organizations: Not on file    Relationship status: Not on file  . Intimate partner violence:    Fear of current or ex partner: Not on file    Emotionally abused: Not on file    Physically abused: Not on file    Forced sexual activity: Not on file  Other Topics Concern  . Not on file  Social History Narrative   Patient lives at home with mom.    Patient does not have any children.    Patient has a 10th grade education.    Patient is single.    Patient is left handed.    Does not have a living will or HPOA- full code.     PHYSICAL EXAM:  VS: BP 126/84   Pulse 83   Temp 98 F (36.7 C) (Oral)   Ht 5\' 6"  (1.676 m)   Wt 288 lb 12.8 oz (131 kg)   LMP 12/19/2014 (Exact Date)   SpO2 98%   BMI 46.61 kg/m  Physical Exam Gen: NAD, alert, cooperative with exam, well-appearing ENT: normal lips, normal nasal mucosa,  Eye: normal EOM, normal conjunctiva and lids CV:  no edema, +2 pedal pulses   Resp: no accessory muscle use, non-labored,  Skin: no rashes, no areas  of induration  Neuro: normal tone, normal sensation to touch Psych:  normal insight, alert and oriented MSK:  Right wrist:  TTP over the TFCC  Ecchymosis on the volar ulnar side of the wrist Normal grip strength  Normal wrist ROM  Normal finder adduction and abduction  No TTP of the distal ulnar or radius  Neurovascularly  intact   Limited ultrasound: right wrist:  Normal appearing distal radius Normal appearing ECU and insertion  Normal appearing carpal tunnel  Possible for effusion superficial to the flexor tendon before entering carpal tunnel    Summary: findings suggest possible strain of flexor tendons   Ultrasound and interpretation by Clearance Coots, MD       ASSESSMENT & PLAN:   Right wrist pain Don't appreciate a fracture. Possible strain of flexor tendons. Unclear of the mechanism in how she landed.  - placed in wrist brace  - counseled on supportive care and HEP  - if no improvement consider imaging.

## 2018-07-09 NOTE — Telephone Encounter (Signed)
Is there another # where I can reach the Mom? The # for [place] identifies the pt, and the MB is full.

## 2018-07-09 NOTE — Telephone Encounter (Signed)
Patient's mother called and says the patient fell this morning around 0800 and she must have tried catching herself with her right hand, because the wrist and hand hurts her. She says the pain is real bad and she's not able to use her hand, it hurts to pull up her clothes. She says the wrist is a little swollen on the bone, no numbness or tingling to the hand/fingers. She denies any other symptoms. I asked does she have the capability for a webex visit, she says yes. I asked her to hold while I call the office. I called and spoke to [place], [facility] who asked to speak to the mother, the call connected and transferred successfully.   Answer Assessment - Initial Assessment Questions 1. ONSET: "When did the pain start?"     Today  2. LOCATION: "Where is the pain located?"     Right wrist 3. PAIN: "How bad is the pain?" (Scale 1-10; or mild, moderate, severe)   - MILD (1-3): doesn't interfere with normal activities   - MODERATE (4-7): interferes with normal activities (e.g., work or school) or awakens from sleep   - SEVERE (8-10): excruciating pain, unable to use hand at all     Severe 4. WORK OR EXERCISE: "Has there been any recent work or exercise that involved this part of the body?"     Fell this morning 5. CAUSE: "What do you think is causing the pain?"     Injury from fall 6. AGGRAVATING FACTORS: "What makes the pain worse?" (e.g., using computer)     Using the hand to pick up something 7. OTHER SYMPTOMS: "Do you have any other symptoms?" (e.g., neck pain, swelling, rash, numbness, fever)     Swelling to the wrist 8. PREGNANCY: "Is there any chance you are pregnant?" "When was your last menstrual period?"     No  Protocols used: HAND AND WRIST PAIN-A-AH

## 2018-07-09 NOTE — Telephone Encounter (Signed)
Pt scheduled for Dr. Deborra Medina for doxy.me 4/9 at 12p

## 2018-07-09 NOTE — Patient Instructions (Signed)
Nice to meet you  Please try to ice the ice  Please use tylenol, ibuprofen or pennsaid  Please wear the brace for 1-2 weeks. You don't have to wear it at night  Please try the exercises after 1 weeks. There shouldn't be pain with them  Please send me a MyChart message in 2-3 weeks with updates or questions.

## 2018-07-09 NOTE — Progress Notes (Signed)
I returned call from patient's mom.  She states that for the past 2 weeks the patient has been more irritable and "grouchy."  She also lays around and does not want to do anything.  Motivation is extremely low.  No suicidal thoughts are reported.  Does not cry easily.  She was having some of the symptoms a month ago and stated that the Zoloft she been on for approximately 20 years was no longer helpful.  Zoloft was discontinued and she was changed to Lexapro.  The patient had no problems with that transition. Recommend increase Lexapro 20 mg to 1-1/2 pills (30 mg) and follow-up with Thayer Headings, NP in 1 month.

## 2018-07-13 ENCOUNTER — Ambulatory Visit: Payer: Medicare Other | Admitting: Psychiatry

## 2018-07-16 NOTE — Telephone Encounter (Signed)
Try the number that is listed for [place]  [phone].  Thanks

## 2018-07-16 NOTE — Telephone Encounter (Signed)
I spoke w/ mom on 07/09/18 and increased the Lexapro.  Is there something new that needs to be done?

## 2018-07-18 ENCOUNTER — Other Ambulatory Visit: Payer: Self-pay | Admitting: Psychiatry

## 2018-07-18 DIAGNOSIS — F319 Bipolar disorder, unspecified: Secondary | ICD-10-CM

## 2018-07-18 DIAGNOSIS — F418 Other specified anxiety disorders: Secondary | ICD-10-CM

## 2018-08-04 ENCOUNTER — Other Ambulatory Visit: Payer: Self-pay

## 2018-08-04 ENCOUNTER — Encounter: Payer: Self-pay | Admitting: Psychiatry

## 2018-08-04 ENCOUNTER — Ambulatory Visit (INDEPENDENT_AMBULATORY_CARE_PROVIDER_SITE_OTHER): Payer: Medicare Other | Admitting: Psychiatry

## 2018-08-04 DIAGNOSIS — F5101 Primary insomnia: Secondary | ICD-10-CM | POA: Diagnosis not present

## 2018-08-04 DIAGNOSIS — F3164 Bipolar disorder, current episode mixed, severe, with psychotic features: Secondary | ICD-10-CM

## 2018-08-04 DIAGNOSIS — F418 Other specified anxiety disorders: Secondary | ICD-10-CM | POA: Diagnosis not present

## 2018-08-04 NOTE — Progress Notes (Signed)
Nancy Hall [id number] 03-06-1971 48 y.o.  Virtual Visit via Video Note  I connected with@ on 08/05/18 at 10:30 AM EDT by a video enabled telemedicine application and verified that I am speaking with the correct person using two identifiers.   I discussed the limitations of evaluation and management by telemedicine and the availability of in person appointments. The patient expressed understanding and agreed to proceed.  I discussed the assessment and treatment plan with the patient. The patient was provided an opportunity to ask questions and all were answered. The patient agreed with the plan and demonstrated an understanding of the instructions.   The patient was advised to call back or seek an in-person evaluation if the symptoms worsen or if the condition fails to improve as anticipated.  I provided 30 minutes of non-face-to-face time during this encounter.  The patient was located at home.  The provider was located at home.   Thayer Headings, PMHNP   Subjective:   Patient ID:  Nancy Hall is a 48 y.o. (DOB 1970-07-21) female.  Chief Complaint:  Chief Complaint  Patient presents with  . Depression  . Anxiety  . Other    Irritability, Possible paranoia    HPI Nancy Hall presents for follow-up of mood disturbance, anxiety, and h/o psychosis. Her mother reports that pt thinks that everyone in the family is "44 and hateful." Mother reports that pt has been suspicious of others and has had increased irritability.  Mother reports that pt is sleeping frequently and c/o her whole body itching. Mother reports that pt insists on sleeping on the couch in the living room and not in bedroom. They report that she has had some worry. She has had some depression. Pt reports that she is having some fatigue. Denies change in appetite. Increased cravings of sweets. Mother reports that pt is having increased paranoia re: her nephew. Denies AH or VH.   Denies SI.   Mother called on  call provider and were advised to increase Lexapro to 30 mg po qd.   Patient's mother reports that patient believes that family members are "being mean" to her and that patient seems to have various delusions.  She reports that patient is frequently scratching at her legs and mother seems to think that this is anxiety related and states patient has said that she feels as if something is crawling on her.  Recent orofacial movements. Possible jaw tightening. Mother reports that pt fell and injured her hand 3 weeks ago.   Past medication trials: Abilify-helpful for mood signs and symptoms. Had tremor and involuntary orofacial movements. Possible weight gain. Vraylar- Had tremor and oral facial movements Latuda-ineffective for mood signs and symptoms. No tolerability issues. Sertraline-has taken long-term. May have been helpful for depression. Lamictal-prescribed for seizures by neurologist. Patient has taken long-term and effect on mood is unclear. Topamax-taken long-term and was started for seizures. Depakote-prescribed for seizures in the past. Lithium- tremor, hot flashes, worsening mood.  Has not taken Fanapt or Loxapine.   Review of Systems:  Review of Systems  Musculoskeletal: Negative for gait problem.       Reports that her hand pain has improved.  Skin: Positive for rash.       She reports that she has been having a rash on her legs and is itching all over. Reports that PCP thought that pt had scratched her skin and developed a skin infection.  Neurological: Negative for dizziness and tremors.  Psychiatric/Behavioral:  Please refer to HPI    Medications: I have reviewed the patient's current medications.  Current Outpatient Medications  Medication Sig Dispense Refill  . acetaminophen (TYLENOL) 325 MG tablet Take 650 mg by mouth every 6 (six) hours as needed.    Marland Kitchen escitalopram (LEXAPRO) 20 MG tablet Take 1.5 tablets (30 mg total) by mouth daily. 45 tablet 0  .  gabapentin (NEURONTIN) 300 MG capsule TAKE 1 CAPSULE BY MOUTH EVERYDAY AT BEDTIME 90 capsule 1  . lamoTRIgine (LAMICTAL) 150 MG tablet Take 1 tablet (150 mg total) by mouth 2 (two) times daily. 180 tablet 3  . levothyroxine (SYNTHROID, LEVOTHROID) 137 MCG tablet Take 1 tablet (137 mcg total) by mouth daily before breakfast. 60 tablet 3  . omeprazole (PRILOSEC) 40 MG capsule TAKE 1 CAPSULE (40 MG TOTAL) BY MOUTH DAILY. 90 capsule 0  . topiramate (TOPAMAX) 100 MG tablet One tablet in the morning and 1.5 tablets at night 225 tablet 3  . fluocinonide-emollient (LIDEX-E) 0.05 % cream Apply 1 application topically 2 (two) times daily. (Patient not taking: Reported on 07/09/2018) 30 g 0  . Iloperidone (FANAPT) 2 MG TABS Take 1 mg at bedtime for 3 nights, then increase to 2 mg at bedtime 60 tablet 0   No current facility-administered medications for this visit.     Medication Side Effects: None  Allergies:  Allergies  Allergen Reactions  . Penicillins Rash    Has patient had a PCN reaction causing immediate rash, facial/tongue/throat swelling, SOB or lightheadedness with hypotension: Yes Has patient had a PCN reaction causing severe rash involving mucus membranes or skin necrosis: No Has patient had a PCN reaction that required hospitalization: No Has patient had a PCN reaction occurring within the last 10 years: Yes If all of the above answers are "NO", then may proceed with Cephalosporin use.  3g Ancef administered 10/21/2017 without reaction/complic    Past Medical History:  Diagnosis Date  . Anemia 2016  . Anxiety   . Bipolar disorder ([place])   . Cancer ([place])   . Complication of anesthesia   . Depression   . Epigastric hernia   . Esophagitis    LA Class A  . GERD (gastroesophageal reflux disease)   . Hiatal hernia   . HOH (hard of hearing)   . Hypothyroidism   . Learning disability   . Obesity   . PONV (postoperative nausea and vomiting)   . Pseudoseizures   . Seizure ([place]) 2014    last seizure 2(two) years ago  . Seizures ([place])    according to echart- seizures vs pseudoseizures  . Sleep apnea    No cpap  . Thyroid disease   . Ventral hernia     Family History  Problem Relation Age of Onset  . Colon polyps Mother   . Celiac disease Mother   . Depression Mother   . Anxiety disorder Mother   . Colon polyps Father   . Celiac disease Brother   . Diabetes Sister   . Depression Sister   . Depression Brother   . CVA Brother   . Diabetes Paternal Grandmother   . Heart disease Paternal Grandfather   . Colon polyps Paternal Aunt   . Diabetes Maternal Aunt   . Heart disease Maternal Grandfather   . Heart disease Maternal Uncle   . Heart disease Maternal Aunt   . Seizures Neg Hx     Social History   Socioeconomic History  . Marital status: Single    Spouse  name: Not on file  . Number of children: 0  . Years of education: 10  . Highest education level: Not on file  Occupational History  . Not on file  Social Needs  . Financial resource strain: Not on file  . Food insecurity:    Worry: Not on file    Inability: Not on file  . Transportation needs:    Medical: Not on file    Non-medical: Not on file  Tobacco Use  . Smoking status: Never Smoker  . Smokeless tobacco: Never Used  Substance and Sexual Activity  . Alcohol use: No    Alcohol/week: 0.0 standard drinks  . Drug use: No  . Sexual activity: Not Currently  Lifestyle  . Physical activity:    Days per week: Not on file    Minutes per session: Not on file  . Stress: Not on file  Relationships  . Social connections:    Talks on phone: Not on file    Gets together: Not on file    Attends religious service: Not on file    Active member of club or organization: Not on file    Attends meetings of clubs or organizations: Not on file    Relationship status: Not on file  . Intimate partner violence:    Fear of current or ex partner: Not on file    Emotionally abused: Not on file     Physically abused: Not on file    Forced sexual activity: Not on file  Other Topics Concern  . Not on file  Social History Narrative   Patient lives at home with mom.    Patient does not have any children.    Patient has a 10th grade education.    Patient is single.    Patient is left handed.    Does not have a living will or HPOA- full code.    Past Medical History, Surgical history, Social history, and Family history were reviewed and updated as appropriate.   Please see review of systems for further details on the patient's review from today.   Objective:   Physical Exam:  LMP 12/19/2014 (Exact Date)   Physical Exam Neurological:     Mental Status: She is alert and oriented to person, place, and time.     Cranial Nerves: No dysarthria.  Psychiatric:        Mood and Affect: Mood is anxious and depressed.        Speech: Speech is delayed.        Behavior: Behavior is slowed and withdrawn.        Thought Content: Thought content is paranoid and delusional. Thought content does not include homicidal or suicidal ideation. Thought content does not include homicidal or suicidal plan.        Cognition and Memory: Cognition and memory normal.        Judgment: Judgment normal.     Comments: Dysphoric mood.  Presents as guarded and possibly internally pre-occupied.     Lab Review:     Component Value Date/Time   NA 139 10/13/2017 0901   K 3.8 10/13/2017 0901   CL 110 10/13/2017 0901   CO2 20 (L) 10/13/2017 0901   GLUCOSE 133 (H) 10/13/2017 0901   BUN 10 10/13/2017 0901   CREATININE 0.98 10/13/2017 0901   CALCIUM 8.5 (L) 10/13/2017 0901   PROT 6.4 07/29/2017 1220   ALBUMIN 3.9 07/29/2017 1220   AST 14 07/29/2017 1220   ALT 16 07/29/2017  1220   ALKPHOS 76 07/29/2017 1220   BILITOT 0.4 07/29/2017 1220   GFRNONAA >60 10/13/2017 0901   GFRAA >60 10/13/2017 0901       Component Value Date/Time   WBC 6.0 10/13/2017 0901   RBC 3.89 10/13/2017 0901   HGB 11.6 (L)  10/13/2017 0901   HGB 12.3 11/03/2014 1100   HCT 37.6 10/13/2017 0901   HCT 35.7 11/03/2014 1100   PLT 176 10/13/2017 0901   MCV 96.7 10/13/2017 0901   MCH 29.8 10/13/2017 0901   MCHC 30.9 10/13/2017 0901   RDW 14.3 10/13/2017 0901   LYMPHSABS 1.4 12/19/2016 1400   MONOABS 0.3 12/19/2016 1400   EOSABS 0.5 12/19/2016 1400   BASOSABS 0.0 12/19/2016 1400    No results found for: POCLITH, LITHIUM   No results found for: PHENYTOIN, PHENOBARB, VALPROATE, CBMZ   .res Assessment: Plan:   Case staffed with Dr. Clovis Pu.  Discussed possible treatment options and that patient most likely would benefit from restarting an antipsychotic since she is exhibiting possible psychotic behavior delusions.  Discussed that antipsychotic would also be helpful for stabilizing her mood mood improving anxiety and irritability.  Discussed using an antipsychotic with low risk of involuntary movements and weight gain since these have been tolerability issues with other antipsychotics. Will start Fanapt 1 mg at bedtime for 3 nights and then increase to 2 mg at bedtime for psychosis and stabilization. Will decrease Lexapro to 20 mg daily since there has been no significant improvement with higher dose of Lexapro and this could potentially be exacerbating irritability. Recommended that caregiver call office early next week with update regarding patient's response to medication and to determine if further dose adjustment is appropriate. Patient follow-up in 3 weeks or sooner if clinically indicated. Patient advised to contact office with any questions, adverse effects, or acute worsening in signs and symptoms.  Bipolar affective disorder, mixed, severe, with psychotic behavior ([place]) - Plan: Iloperidone (FANAPT) 2 MG TABS  Other specified anxiety disorders  Primary insomnia  Please see After Visit Summary for patient specific instructions.  Future Appointments  Date Time Provider [place]  12/21/2018 10:30  AM Ward Givens, NP [facility] None  01/27/2019 11:00 AM Dennis Bast, RN [facility]  02/23/2019 10:00 AM Philemon Kingdom, MD [facility] None    No orders of the defined types were placed in this encounter.     -------------------------------

## 2018-08-05 MED ORDER — ILOPERIDONE 2 MG PO TABS: ORAL_TABLET | ORAL | 0 refills | Status: DC

## 2018-08-05 NOTE — Patient Instructions (Addendum)
Start Fanapt 1 mg at bedtime for 3 nights, then increase to 2 mg at bedtime.  Decrease Escitalopram (Lexapro) to one 20 mg tablet daily.  Please call our office next week with an update on how things are going with the new medication and then we can adjust dose if needed.

## 2018-08-11 ENCOUNTER — Telehealth: Payer: Self-pay | Admitting: Psychiatry

## 2018-08-11 ENCOUNTER — Other Ambulatory Visit: Payer: Self-pay

## 2018-08-11 MED ORDER — ESCITALOPRAM OXALATE 20 MG PO TABS: 20.0000 mg | ORAL_TABLET | Freq: Every day | ORAL | 1 refills | Status: DC

## 2018-08-11 NOTE — Telephone Encounter (Signed)
Pt mom called to advise samples of Fanadt seems to be doing some good. Only taken 2 doses. Does she continue to take this med? Also, will need refill on Lexapro . Almost out. [facility] on [address]

## 2018-08-11 NOTE — Telephone Encounter (Signed)
Spoke with Mom, Estill Bamberg instructed for her to stay at 2 mg of [place] for now and to call back with. Instructed to split the 4 mg samples in 1/2. rx for lexapro 20 mg sent to pharmacy.

## 2018-08-14 ENCOUNTER — Telehealth: Payer: Self-pay | Admitting: Psychiatry

## 2018-08-14 DIAGNOSIS — F3164 Bipolar disorder, current episode mixed, severe, with psychotic features: Secondary | ICD-10-CM

## 2018-08-14 MED ORDER — ILOPERIDONE 2 MG PO TABS: 2.0000 mg | ORAL_TABLET | Freq: Every day | ORAL | 0 refills | Status: DC

## 2018-08-14 NOTE — Telephone Encounter (Signed)
Nancy Hall mom just called back.  Didn't report anything different from the first message.  Please try again.  Can call [phone] or [phone]

## 2018-08-14 NOTE — Telephone Encounter (Signed)
Left voice mail to call back 

## 2018-08-14 NOTE — Telephone Encounter (Signed)
Patient's mom called and said that she is taking the fanatt and it is working but the last 2 days she has been dizzy. Is this normal or is this a side effect? Please call mom evelyn at [phone]

## 2018-08-17 ENCOUNTER — Telehealth: Payer: Self-pay | Admitting: Psychiatry

## 2018-08-17 NOTE — Telephone Encounter (Signed)
Patient's mother left vm today @10 :16 stating patient is doing good the medication must be working, patient has appt. 06/01

## 2018-08-17 NOTE — Telephone Encounter (Signed)
Left voicemail with information

## 2018-08-19 NOTE — Telephone Encounter (Signed)
Yes it is covered. Her pharmacy is out of it but has ordered it and it should be in tomorrow. She is going to cut the 6 mg tablet into a third to cover tonight and will call if there are any issues.

## 2018-08-31 ENCOUNTER — Encounter: Payer: Self-pay | Admitting: Psychiatry

## 2018-08-31 ENCOUNTER — Ambulatory Visit (INDEPENDENT_AMBULATORY_CARE_PROVIDER_SITE_OTHER): Payer: Medicare Other | Admitting: Psychiatry

## 2018-08-31 ENCOUNTER — Other Ambulatory Visit: Payer: Self-pay

## 2018-08-31 DIAGNOSIS — F418 Other specified anxiety disorders: Secondary | ICD-10-CM | POA: Diagnosis not present

## 2018-08-31 DIAGNOSIS — F319 Bipolar disorder, unspecified: Secondary | ICD-10-CM

## 2018-08-31 MED ORDER — ILOPERIDONE 6 MG PO TABS: 3.0000 mg | ORAL_TABLET | Freq: Every day | ORAL | 0 refills | Status: DC

## 2018-08-31 NOTE — Progress Notes (Signed)
Nancy Hall [id number] 01/12/1971 48 y.o.  Subjective:   Patient ID:  Nancy Hall is a 48 y.o. (DOB Jun 05, 1970) female.  Chief Complaint:  Chief Complaint  Patient presents with  . Anxiety  . Other    Depression, Irritability, and Psychosis    HPI MONTSERRATH MADDING presents to the office today for follow-up of mood and anxiety.  She is accompanied by her mother who is also her caregiver.  Mother reports Fanapt has helped some with her anxiety, depression, and irritability.   Her mother reports that Fanapt is "helping her some." Mother reports that pt is not sleeping well. Has been having early morning awakening many mornings, often around 4 am. Pt is not sure what time she is going to bed and mother thinks it is around 8-9 am. Mother reports that pt continues to have some irritability and will argue with family. Mother reports that pt is getting along better with her nephew and no longer thinks he is out to get her. She reports sad mood "off and on." Low energy. Motivation has been low overall. Will occasionally clean. Mother reports that pt wants to return home as soon as possible when they are out and this is not consistent with baseline. They report that she is periodically c/o increased anxiety and that this may be related to some conflict in the house with other family members. Mother reports that pt c/o low appetite for meals and then craving sweets. Mother reports that pt's concentration is adequate. Denies SI.   They deny any impulsive or risky behaviors. She denies AH, VH, or delusions. They deny periods of increased energy and goal-directed activity.   Pt reports that she is itching constantly and that it is all over her body. Mother reports that pt will pick at her skin. Mother reports that pt has asked her to check her back to see if something was crawling on her back. Pt has multiple open lesions and scars on her forearms and legs.   Mother reports that pt will go to her room  and be alone at 6 pm.   Past medication trials: Abilify-helpful for mood signs and symptoms. Had tremor and involuntary orofacial movements. Possible weight gain. Vraylar- Had tremor and oral facial movements Latuda-ineffective for mood signs and symptoms. No tolerability issues. Sertraline-has taken long-term. May have been helpful for depression. Lamictal-prescribed for seizures by neurologist. Patient has taken long-term and effect on mood is unclear. Topamax-taken long-term and was started for seizures. Depakote-prescribed for seizures in the past. Lithium- tremor, hot flashes, worsening mood.  Review of Systems:  Review of Systems  Gastrointestinal: Positive for abdominal pain.       Pain around where she had prior hernia repair.  Musculoskeletal: Negative for gait problem.  Neurological: Negative for tremors.       They deny any involuntary movements.   Psychiatric/Behavioral:       Please refer to HPI    Medications: I have reviewed the patient's current medications.  Current Outpatient Medications  Medication Sig Dispense Refill  . acetaminophen (TYLENOL) 325 MG tablet Take 650 mg by mouth every 6 (six) hours as needed.    Marland Kitchen escitalopram (LEXAPRO) 20 MG tablet Take 1 tablet (20 mg total) by mouth daily. 30 tablet 1  . gabapentin (NEURONTIN) 300 MG capsule TAKE 1 CAPSULE BY MOUTH EVERYDAY AT BEDTIME 90 capsule 1  . lamoTRIgine (LAMICTAL) 150 MG tablet Take 1 tablet (150 mg total) by mouth 2 (two) times daily.  180 tablet 3  . levothyroxine (SYNTHROID, LEVOTHROID) 137 MCG tablet Take 1 tablet (137 mcg total) by mouth daily before breakfast. 60 tablet 3  . omeprazole (PRILOSEC) 40 MG capsule TAKE 1 CAPSULE (40 MG TOTAL) BY MOUTH DAILY. 90 capsule 0  . topiramate (TOPAMAX) 100 MG tablet One tablet in the morning and 1.5 tablets at night 225 tablet 3  . fluocinonide-emollient (LIDEX-E) 0.05 % cream Apply 1 application topically 2 (two) times daily. (Patient not taking:  Reported on 07/09/2018) 30 g 0  . Iloperidone (FANAPT) 6 MG TABS Take 0.5 tablets (3 mg total) by mouth at bedtime for 28 doses. 14 tablet 0   No current facility-administered medications for this visit.     Medication Side Effects: Other: Initially some dizziness and this has resolved  Allergies:  Allergies  Allergen Reactions  . Penicillins Rash    Has patient had a PCN reaction causing immediate rash, facial/tongue/throat swelling, SOB or lightheadedness with hypotension: Yes Has patient had a PCN reaction causing severe rash involving mucus membranes or skin necrosis: No Has patient had a PCN reaction that required hospitalization: No Has patient had a PCN reaction occurring within the last 10 years: Yes If all of the above answers are "NO", then may proceed with Cephalosporin use.  3g Ancef administered 10/21/2017 without reaction/complic    Past Medical History:  Diagnosis Date  . Anemia 2016  . Anxiety   . Bipolar disorder ([place])   . Cancer ([place])   . Complication of anesthesia   . Depression   . Epigastric hernia   . Esophagitis    LA Class A  . GERD (gastroesophageal reflux disease)   . Hiatal hernia   . HOH (hard of hearing)   . Hypothyroidism   . Learning disability   . Obesity   . PONV (postoperative nausea and vomiting)   . Pseudoseizures   . Seizure ([place]) 2014   last seizure 2(two) years ago  . Seizures ([place])    according to echart- seizures vs pseudoseizures  . Sleep apnea    No cpap  . Thyroid disease   . Ventral hernia     Family History  Problem Relation Age of Onset  . Colon polyps Mother   . Celiac disease Mother   . Depression Mother   . Anxiety disorder Mother   . Colon polyps Father   . Celiac disease Brother   . Diabetes Sister   . Depression Sister   . Depression Brother   . CVA Brother   . Diabetes Paternal Grandmother   . Heart disease Paternal Grandfather   . Colon polyps Paternal Aunt   . Diabetes Maternal Aunt   . Heart  disease Maternal Grandfather   . Heart disease Maternal Uncle   . Heart disease Maternal Aunt   . Seizures Neg Hx     Social History   Socioeconomic History  . Marital status: Single    Spouse name: Not on file  . Number of children: 0  . Years of education: 10  . Highest education level: Not on file  Occupational History  . Not on file  Social Needs  . Financial resource strain: Not on file  . Food insecurity:    Worry: Not on file    Inability: Not on file  . Transportation needs:    Medical: Not on file    Non-medical: Not on file  Tobacco Use  . Smoking status: Never Smoker  . Smokeless tobacco: Never Used  Substance and Sexual Activity  . Alcohol use: No    Alcohol/week: 0.0 standard drinks  . Drug use: No  . Sexual activity: Not Currently  Lifestyle  . Physical activity:    Days per week: Not on file    Minutes per session: Not on file  . Stress: Not on file  Relationships  . Social connections:    Talks on phone: Not on file    Gets together: Not on file    Attends religious service: Not on file    Active member of club or organization: Not on file    Attends meetings of clubs or organizations: Not on file    Relationship status: Not on file  . Intimate partner violence:    Fear of current or ex partner: Not on file    Emotionally abused: Not on file    Physically abused: Not on file    Forced sexual activity: Not on file  Other Topics Concern  . Not on file  Social History Narrative   Patient lives at home with mom.    Patient does not have any children.    Patient has a 10th grade education.    Patient is single.    Patient is left handed.    Does not have a living will or HPOA- full code.    Past Medical History, Surgical history, Social history, and Family history were reviewed and updated as appropriate.   Please see review of systems for further details on the patient's review from today.   Objective:   Physical Exam:  LMP 12/19/2014  (Exact Date)   Physical Exam Constitutional:      General: She is not in acute distress.    Appearance: She is well-developed.  Musculoskeletal:        General: No deformity.  Neurological:     Mental Status: She is alert and oriented to person, place, and time.     Coordination: Coordination normal.  Psychiatric:        Mood and Affect: Mood is anxious. Mood is not depressed. Affect is not labile, blunt, angry or inappropriate.        Speech: Speech normal.        Behavior: Behavior is cooperative.        Thought Content: Thought content does not include homicidal or suicidal ideation. Thought content does not include homicidal or suicidal plan.        Judgment: Judgment is inappropriate.     Comments: Insight impaired Patient presents as less withdrawn and less internally preoccupied.     Lab Review:     Component Value Date/Time   NA 139 10/13/2017 0901   K 3.8 10/13/2017 0901   CL 110 10/13/2017 0901   CO2 20 (L) 10/13/2017 0901   GLUCOSE 133 (H) 10/13/2017 0901   BUN 10 10/13/2017 0901   CREATININE 0.98 10/13/2017 0901   CALCIUM 8.5 (L) 10/13/2017 0901   PROT 6.4 07/29/2017 1220   ALBUMIN 3.9 07/29/2017 1220   AST 14 07/29/2017 1220   ALT 16 07/29/2017 1220   ALKPHOS 76 07/29/2017 1220   BILITOT 0.4 07/29/2017 1220   GFRNONAA >60 10/13/2017 0901   GFRAA >60 10/13/2017 0901       Component Value Date/Time   WBC 6.0 10/13/2017 0901   RBC 3.89 10/13/2017 0901   HGB 11.6 (L) 10/13/2017 0901   HGB 12.3 11/03/2014 1100   HCT 37.6 10/13/2017 0901   HCT 35.7 11/03/2014 1100   PLT  176 10/13/2017 0901   MCV 96.7 10/13/2017 0901   MCH 29.8 10/13/2017 0901   MCHC 30.9 10/13/2017 0901   RDW 14.3 10/13/2017 0901   LYMPHSABS 1.4 12/19/2016 1400   MONOABS 0.3 12/19/2016 1400   EOSABS 0.5 12/19/2016 1400   BASOSABS 0.0 12/19/2016 1400    No results found for: POCLITH, LITHIUM   No results found for: PHENYTOIN, PHENOBARB, VALPROATE, CBMZ   .res Assessment: Plan:    Discussed potential benefits, risks, and side effects of increasing Fanapt to 3 mg p.o. nightly for possible psychotic signs and symptoms, anxiety, and mood.  Discussed that patient may experience some mild, transient orthostatic hypotension.  Advised patient's mother to contact office if patient experiences significant or persistent dizziness. Provided samples of Fanapt 6 mg tablets and advised mother to give patient 1/2 tablet p.o. nightly. Continue Lexapro 20 mg daily for anxiety and depression. Patient to follow-up in 4 weeks or sooner if clinically indicated. Patient advised to contact office with any questions, adverse effects, or acute worsening in signs and symptoms.  Bipolar depression ([place]) - Plan: Iloperidone (FANAPT) 6 MG TABS  Other specified anxiety disorders - Plan: Iloperidone (FANAPT) 6 MG TABS  Please see After Visit Summary for patient specific instructions.  Future Appointments  Date Time Provider [place]  09/25/2018  9:30 AM Thayer Headings, PMHNP [facility] None  12/21/2018 10:30 AM Ward Givens, NP [facility] None  01/27/2019 11:00 AM Dennis Bast, RN [facility]  02/23/2019 10:00 AM Philemon Kingdom, MD [facility] None    No orders of the defined types were placed in this encounter.     -------------------------------

## 2018-09-08 ENCOUNTER — Telehealth: Payer: Self-pay | Admitting: Psychiatry

## 2018-09-08 DIAGNOSIS — F319 Bipolar disorder, unspecified: Secondary | ICD-10-CM

## 2018-09-08 DIAGNOSIS — F418 Other specified anxiety disorders: Secondary | ICD-10-CM

## 2018-09-08 MED ORDER — LORAZEPAM 0.5 MG PO TABS: ORAL_TABLET | ORAL | 1 refills | Status: DC

## 2018-09-08 MED ORDER — ESCITALOPRAM OXALATE 20 MG PO TABS: 10.0000 mg | ORAL_TABLET | Freq: Every day | ORAL | 1 refills | Status: DC

## 2018-09-08 NOTE — Telephone Encounter (Signed)
Patient's mom called and said that Nancy Hall is being really hateful and irritable.inhaled corticosteroids there any thing you can do? Please call her at [phone]

## 2018-09-08 NOTE — Telephone Encounter (Signed)
Mother reports that pt has been irritable and easily annoyed. Mother reports that she has been "fussing" at multiple family members. Reports that she has been argumentative with family. She reports that she is "taking things the wrong way." For instance, if they could not hear her and they asked her to repeat it and then will say she is not going to talk to them anymore.   Has made comments that family is all against her and ganging up on her. Mother reports that pt continues to sleep on the couch and it is unclear if she is fearful about sleeping in her room.   Mood have been up and down. Mother notices that she seems anxious at times.   Past medication trials: Abilify-helpful for mood signs and symptoms. Had tremor and involuntary orofacial movements. Possible weight gain. Vraylar- Had tremor and oral facial movements Latuda-ineffective for mood signs and symptoms. No tolerability issues. Sertraline-has taken long-term. May have been helpful for depression. Lamictal-prescribed for seizures by neurologist. Patient has taken long-term and effect on mood is unclear. Topamax-taken long-term and was started for seizures. Depakote-prescribed for seizures in the past. Lithium- tremor, hot flashes, worsening mood. Ativan- effective.    PLAN: - Decrease Lexapro to 20 mg 1/2 tab po qd since higher dose may be causing increased activation and irritability.  -Start Ativan 0.5 mg 1/2-1 tab po q 6 hours prn anxiety, agitation, or insomnia. Discussed potential benefits, risk, and side effects of benzodiazepines to include potential risk of tolerance and dependence, as well as possible drowsiness.  Advised using lowest possible effective dose to minimize risk of dependence and tolerance. -Advised mother to call back if s/s worsen or do not improve.

## 2018-09-16 ENCOUNTER — Ambulatory Visit: Payer: Self-pay

## 2018-09-16 NOTE — Telephone Encounter (Signed)
Patient's mother called and she says the patient has been having right side abdominal pain since last week. She says the pain is just from her umbilicus to the right, not in the back or up into the chest. She says the pain is a 7 and is worse with any movement. She denies any other symptoms. I advised care advice and that someone from the office will call tomorrow to schedule an office visit, she verbalized understanding.  Reason for Disposition . [1] MODERATE pain (e.g., interferes with normal activities) AND [2] pain comes and goes (cramps) AND [3] present > 24 hours  (Exception: pain with Vomiting or Diarrhea - see that Guideline)  Answer Assessment - Initial Assessment Questions 1. LOCATION: "Where does it hurt?"      Right side 2. RADIATION: "Does the pain shoot anywhere else?" (e.g., chest, back)     From belly button to the right side 3. ONSET: "When did the pain begin?" (e.g., minutes, hours or days ago)      Last week 4. SUDDEN: "Gradual or sudden onset?"     Sudden 5. PATTERN "Does the pain come and go, or is it constant?"    - If constant: "Is it getting better, staying the same, or worsening?"      (Note: Constant means the pain never goes away completely; most serious pain is constant and it progresses)     - If intermittent: "How long does it last?" "Do you have pain now?"     (Note: Intermittent means the pain goes away completely between bouts)     Eases but doesn't go away 6. SEVERITY: "How bad is the pain?"  (e.g., Scale 1-10; mild, moderate, or severe)   - MILD (1-3): doesn't interfere with normal activities, abdomen soft and not tender to touch    - MODERATE (4-7): interferes with normal activities or awakens from sleep, tender to touch    - SEVERE (8-10): excruciating pain, doubled over, unable to do any normal activities      7 7. RECURRENT SYMPTOM: "Have you ever had this type of abdominal pain before?" If so, ask: "When was the last time?" and "What happened that  time?"      Yes before having hernia surgery 8. CAUSE: "What do you think is causing the abdominal pain?"     No idea 9. RELIEVING/AGGRAVATING FACTORS: "What makes it better or worse?" (e.g., movement, antacids, bowel movement)     Movement makes it worse 10. OTHER SYMPTOMS: "Has there been any vomiting, diarrhea, constipation, or urine problems?"      No 11. PREGNANCY: "Is there any chance you are pregnant?" "When was your last menstrual period?"       No  Protocols used: ABDOMINAL PAIN - [facility]

## 2018-09-17 ENCOUNTER — Ambulatory Visit (INDEPENDENT_AMBULATORY_CARE_PROVIDER_SITE_OTHER): Payer: Medicare Other | Admitting: Family Medicine

## 2018-09-17 DIAGNOSIS — R1011 Right upper quadrant pain: Secondary | ICD-10-CM | POA: Insufficient documentation

## 2018-09-17 NOTE — Progress Notes (Signed)
Virtual Visit via Video   Due to the COVID-19 pandemic, this visit was completed with telemedicine (audio/video) technology to reduce patient and provider exposure as well as to preserve personal protective equipment.   I connected with Nancy Hall by a video enabled telemedicine application and verified that I am speaking with the correct person using two identifiers. Location patient: Home Location provider: [facility], Office Persons participating in the virtual visit: [name], Arnette Norris, MD   I discussed the limitations of evaluation and management by telemedicine and the availability of in person appointments. The patient expressed understanding and agreed to proceed.  Care Team   Patient Care Team: Lucille Passy, MD as PCP - General (Family Medicine) Philemon Kingdom, MD as Consulting Physician (Internal Medicine) Rubie Maid, MD as Referring Physician (Obstetrics and Gynecology) Dennie Bible, NP as Nurse Practitioner (Family Medicine) Ladene Artist, MD as Consulting Physician (Gastroenterology)  Subjective:   HPI:  RUQ pain- started two weeks ago.  Denies nausea and vomiting.  Hurts with movement and cannot sleep because of pain.  Hurts to take deep breaths.  Denies fevers, chills, nausea or vomiting.  Does wake her up at night.  Pain is usually postprandial.  It lasts for hours.  Hurts to walk and lay down.  No cough.  Sitting makes it better.  Tried rolaids but it did not help.  Family h/o of gallbladder issues- mom could not be more specific with details.  Afebrile.  Review of Systems  Constitutional: Negative for fever.  HENT: Negative.   Eyes: Negative.   Respiratory: Negative.   Cardiovascular: Negative.   Gastrointestinal: Positive for abdominal pain. Negative for blood in stool, constipation, diarrhea, nausea and vomiting.  Musculoskeletal: Negative.   Skin: Negative.   Psychiatric/Behavioral: Negative.  Negative for  depression.  All other systems reviewed and are negative.    Patient Active Problem List   Diagnosis Date Noted  . RUQ pain 09/17/2018  . Postprandial RUQ pain 09/17/2018  . Right wrist pain 07/09/2018  . Rash and nonspecific skin eruption 06/09/2018  . Left hand pain 03/30/2018  . Tremor 03/30/2018  . Severe manic bipolar I disorder with psychotic features ([place]) 01/20/2018  . Atypical mole 12/29/2017  . Constipation 03/04/2017  . Hot flashes 02/24/2017  . Sciatic leg pain 07/10/2016  . Morbid obesity ([place]) 04/05/2014  . Hyperglycemia 09/21/2013  . Dyslipidemia 09/14/2013  . 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 Hypothyroidism 05/30/2011  . Depression, major, recurrent, severe with psychosis ([place]) 05/30/2011  . MR (mental retardation) 05/30/2011  . Bipolar disorder ([place]) 05/30/2011  . Sleep apnea 05/30/2011  . Seizures ([place])     Social History   Tobacco Use  . Smoking status: Never Smoker  . Smokeless tobacco: Never Used  Substance Use Topics  . Alcohol use: No    Alcohol/week: 0.0 standard drinks    Current Outpatient Medications:  .  acetaminophen (TYLENOL) 325 MG tablet, Take 650 mg by mouth every 6 (six) hours as needed., Disp: , Rfl:  .  escitalopram (LEXAPRO) 20 MG tablet, Take 0.5 tablets (10 mg total) by mouth daily., Disp: 30 tablet, Rfl: 1 .  gabapentin (NEURONTIN) 300 MG capsule, TAKE 1 CAPSULE BY MOUTH EVERYDAY AT BEDTIME, Disp: 90 capsule, Rfl: 1 .  Iloperidone (FANAPT) 6 MG TABS, Take 0.5 tablets (3 mg total) by mouth at bedtime for 28 doses., Disp: 14 tablet, Rfl: 0 .  lamoTRIgine (LAMICTAL) 150 MG tablet,  Take 1 tablet (150 mg total) by mouth 2 (two) times daily., Disp: 180 tablet, Rfl: 3 .  levothyroxine (SYNTHROID, LEVOTHROID) 137 MCG tablet, Take 1 tablet (137 mcg total) by mouth daily before breakfast., Disp: 60 tablet, Rfl: 3 .  omeprazole (PRILOSEC) 40 MG capsule, TAKE 1 CAPSULE (40 MG TOTAL) BY MOUTH  DAILY., Disp: 90 capsule, Rfl: 0 .  topiramate (TOPAMAX) 100 MG tablet, One tablet in the morning and 1.5 tablets at night, Disp: 225 tablet, Rfl: 3  Allergies  Allergen Reactions  . Penicillins Rash    Has patient had a PCN reaction causing immediate rash, facial/tongue/throat swelling, SOB or lightheadedness with hypotension: Yes Has patient had a PCN reaction causing severe rash involving mucus membranes or skin necrosis: No Has patient had a PCN reaction that required hospitalization: No Has patient had a PCN reaction occurring within the last 10 years: Yes If all of the above answers are "NO", then may proceed with Cephalosporin use.  3g Ancef administered 10/21/2017 without reaction/complic    Objective:  LMP 12/19/2014 (Exact Date)   VITALS: Per patient if applicable, see vitals. GENERAL: Alert, appears well and in no acute distress. HEENT: Atraumatic, conjunctiva clear, no obvious abnormalities on inspection of external nose and ears. NECK: Normal movements of the head and neck. CARDIOPULMONARY: No increased WOB. Speaking in clear sentences. I:E ratio WNL.  MS: Moves all visible extremities without noticeable abnormality. Abd:mom says he is tender to palpation per mom over RUQ, no rebound tenderness per their report. PSYCH: Pleasant and cooperative, well-groomed. Speech normal rate and rhythm. Affect is appropriate. Insight and judgement are appropriate. Attention is focused, linear, and appropriate.  NEURO: CN grossly intact. Oriented as arrived to appointment on time with no prompting. Moves both UE equally.  SKIN: No obvious lesions, wounds, erythema, or cyanosis noted on face or hands.  Depression screen [facility] 2/9 01/21/2018 12/19/2016 06/27/2015  Decreased Interest 1 1 1   Down, Depressed, Hopeless 0 1 1  PHQ - 2 Score 1 2 2   Altered sleeping - 1 -  Tired, decreased energy - 1 -  Change in appetite - 1 -  Feeling bad or failure about yourself  - 1 -  Trouble concentrating  - 1 -  Moving slowly or fidgety/restless - 1 -  Suicidal thoughts - 0 -  PHQ-9 Score - 8 -  Difficult doing work/chores - Somewhat difficult -    Assessment and Plan:   Katina was seen today for abdominal pain.  Diagnoses and all orders for this visit:  RUQ pain -     US Abdomen Limited RUQ; Future  Postprandial RUQ pain -     US Abdomen Limited RUQ; Future    . COVID-19 Education: The signs and symptoms of COVID-19 were discussed with the patient and how to seek care for testing if needed. The importance of social distancing was discussed today. . Reviewed expectations re: course of current medical issues. . Discussed self-management of symptoms. . Outlined signs and symptoms indicating need for more acute intervention. . Patient verbalized understanding and all questions were answered. Marland Kitchen Health Maintenance issues including appropriate healthy diet, exercise, and smoking avoidance were discussed with patient. . See orders for this visit as documented in the electronic medical record.  Arnette Norris, MD  Records requested if needed. Time spent: 15 minutes, of which >50% was spent in obtaining information about her symptoms, reviewing her previous labs, evaluations, and treatments, counseling her about her condition (please see the discussed topics  above), and developing a plan to further investigate it; she had a number of questions which I addressed.

## 2018-09-17 NOTE — Assessment & Plan Note (Signed)
New- consistent with biliary colic- ordered RUQ urgently.  No signs of surgical abdomen at this point. The patient and her mom indicate understanding of these issues and agrees with the plan.  Orders Placed This Encounter  Procedures  . US Abdomen Limited RUQ

## 2018-09-18 NOTE — Telephone Encounter (Signed)
Pt had virtual visit on 6.18.20/thx dmf

## 2018-09-22 ENCOUNTER — Ambulatory Visit
Admission: RE | Admit: 2018-09-22 | Discharge: 2018-09-22 | Disposition: A | Discharge status: Home / Self Care | Payer: Medicare Other | Source: Ambulatory Visit | Attending: Family Medicine | Admitting: Family Medicine

## 2018-09-22 ENCOUNTER — Other Ambulatory Visit: Payer: Self-pay

## 2018-09-22 DIAGNOSIS — R1011 Right upper quadrant pain: Secondary | ICD-10-CM

## 2018-09-22 DIAGNOSIS — K760 Fatty (change of) liver, not elsewhere classified: Secondary | ICD-10-CM | POA: Diagnosis not present

## 2018-09-25 ENCOUNTER — Ambulatory Visit (INDEPENDENT_AMBULATORY_CARE_PROVIDER_SITE_OTHER): Payer: Medicare Other | Admitting: Psychiatry

## 2018-09-25 ENCOUNTER — Other Ambulatory Visit: Payer: Self-pay | Admitting: Family Medicine

## 2018-09-25 ENCOUNTER — Encounter: Payer: Self-pay | Admitting: Psychiatry

## 2018-09-25 DIAGNOSIS — F319 Bipolar disorder, unspecified: Secondary | ICD-10-CM

## 2018-09-25 DIAGNOSIS — K8050 Calculus of bile duct without cholangitis or cholecystitis without obstruction: Secondary | ICD-10-CM

## 2018-09-25 DIAGNOSIS — F418 Other specified anxiety disorders: Secondary | ICD-10-CM

## 2018-09-25 MED ORDER — ARIPIPRAZOLE 5 MG PO TABS: 7.5000 mg | ORAL_TABLET | Freq: Every day | ORAL | 1 refills | Status: DC

## 2018-09-25 MED ORDER — ESCITALOPRAM OXALATE 10 MG PO TABS: 10.0000 mg | ORAL_TABLET | Freq: Every day | ORAL | 0 refills | Status: DC

## 2018-09-25 NOTE — Progress Notes (Signed)
Nancy Hall [id number] 06/08/1970 48 y.o.  Virtual Visit via Telephone Note  I connected with pt on 09/25/18 at  9:30 AM EDT by telephone and verified that I am speaking with the correct person using two identifiers.   I discussed the limitations, risks, security and privacy concerns of performing an evaluation and management service by telephone and the availability of in person appointments. I also discussed with the patient that there may be a patient responsible charge related to this service. The patient expressed understanding and agreed to proceed.   I discussed the assessment and treatment plan with the patient. The patient was provided an opportunity to ask questions and all were answered. The patient agreed with the plan and demonstrated an understanding of the instructions.   The patient was advised to call back or seek an in-person evaluation if the symptoms worsen or if the condition fails to improve as anticipated.  I provided 30 minutes of non-face-to-face time during this encounter.  The patient was located at home.  The provider was located at [place].   Thayer Headings, PMHNP   Subjective:   Patient ID:  Nancy Hall is a 48 y.o. (DOB December 20, 1970) female.  Chief Complaint:  Chief Complaint  Patient presents with  . Medication Problem    Increased appetite/wt gain  . Anxiety  . Depression    HPI Nancy Hall presents for follow-up of mood and anxiety. Mother is present on exam. Mother reports that pt has been "fussy, angry." Mother reports that pt has been very irritable and telling family members that they don't like her and she may as well leave. Mother reports that Lorazepam seemed to help some. Family reports that pt was more irritable when mother was away for several days and not giving Ativan to her. Mother reports that pt's anxiety seems to manifest as frustration and irritation. Pt reports that she has been feeling more depressed. Pt  reports that she has been feeling bored. Mother reports that pt was maintaining home when mother was away.   Mother reports that pt is not sleeping well. Denies difficulty falling asleep. Will then wake up and go sleep on the couch in the living room. Sleep is fragmented.  Mother reports that pt has been eating frequently and gaining weight. Pt feels like she cannot get enough to eat and is craving sweets. They report that increased food intake has occurred for the last couple of months. Denies any impulsive or risky behavior. Seems to think that family may be against her and this seems to be worse.   Denies SI.   Mother reports that pt has increased anxiety with wearing a face mask.  Past medication trials: Abilify-helpful for mood signs and symptoms. Had tremor and involuntary orofacial movements. Possible weight gain. Vraylar- Had tremor and oral facial movements Latuda-ineffective for mood signs and symptoms. No tolerability issues. Fanapt Sertraline-has taken long-term. May have been helpful for depression. Lamictal-prescribed for seizures by neurologist. Patient has taken long-term and effect on mood is unclear. Topamax-taken long-term and was started for seizures. Depakote-prescribed for seizures in the past. Lithium- tremor, hot flashes, worsening mood.   Review of Systems:  Review of Systems  Cardiovascular: Positive for leg swelling.  Gastrointestinal: Positive for abdominal pain.  Musculoskeletal: Negative for gait problem.  Skin:       Reports that skin sores and itching improved and now seems to be recurring.   Neurological: Negative for tremors.  Psychiatric/Behavioral:  Please refer to HPI    Medications: I have reviewed the patient's current medications.  Current Outpatient Medications  Medication Sig Dispense Refill  . acetaminophen (TYLENOL) 325 MG tablet Take 650 mg by mouth every 6 (six) hours as needed.    . gabapentin (NEURONTIN) 300 MG capsule  TAKE 1 CAPSULE BY MOUTH EVERYDAY AT BEDTIME 90 capsule 1  . lamoTRIgine (LAMICTAL) 150 MG tablet Take 1 tablet (150 mg total) by mouth 2 (two) times daily. 180 tablet 3  . levothyroxine (SYNTHROID, LEVOTHROID) 137 MCG tablet Take 1 tablet (137 mcg total) by mouth daily before breakfast. 60 tablet 3  . LORazepam (ATIVAN) 0.5 MG tablet Take 0.5 mg by mouth every 8 (eight) hours. Take 1/2-1 tab po q 6 hours prn    . omeprazole (PRILOSEC) 40 MG capsule TAKE 1 CAPSULE (40 MG TOTAL) BY MOUTH DAILY. 90 capsule 0  . topiramate (TOPAMAX) 100 MG tablet One tablet in the morning and 1.5 tablets at night 225 tablet 3  . ARIPiprazole (ABILIFY) 5 MG tablet Take 1.5 tablets (7.5 mg total) by mouth daily for 30 days. 45 tablet 1  . escitalopram (LEXAPRO) 10 MG tablet Take 1 tablet (10 mg total) by mouth daily. 90 tablet 0   No current facility-administered medications for this visit.     Medication Side Effects: Other: Increased appetite/Wt. gain  Allergies:  Allergies  Allergen Reactions  . Penicillins Rash    Has patient had a PCN reaction causing immediate rash, facial/tongue/throat swelling, SOB or lightheadedness with hypotension: Yes Has patient had a PCN reaction causing severe rash involving mucus membranes or skin necrosis: No Has patient had a PCN reaction that required hospitalization: No Has patient had a PCN reaction occurring within the last 10 years: Yes If all of the above answers are "NO", then may proceed with Cephalosporin use.  3g Ancef administered 10/21/2017 without reaction/complic    Past Medical History:  Diagnosis Date  . Anemia 2016  . Anxiety   . Bipolar disorder ([place])   . Cancer ([place])   . Complication of anesthesia   . Depression   . Epigastric hernia   . Esophagitis    LA Class A  . GERD (gastroesophageal reflux disease)   . Hiatal hernia   . HOH (hard of hearing)   . Hypothyroidism   . Learning disability   . Obesity   . PONV (postoperative nausea and  vomiting)   . Pseudoseizures   . Seizure ([place]) 2014   last seizure 2(two) years ago  . Seizures ([facility])    according to echart- seizures vs pseudoseizures  . Sleep apnea    No cpap  . Thyroid disease   . Ventral hernia     Family History  Problem Relation Age of Onset  . Colon polyps Mother   . Celiac disease Mother   . Depression Mother   . Anxiety disorder Mother   . Colon polyps Father   . Celiac disease Brother   . Diabetes Sister   . Depression Sister   . Depression Brother   . CVA Brother   . Diabetes Paternal Grandmother   . Heart disease Paternal Grandfather   . Colon polyps Paternal Aunt   . Diabetes Maternal Aunt   . Heart disease Maternal Grandfather   . Heart disease Maternal Uncle   . Heart disease Maternal Aunt   . Seizures Neg Hx     Social History   Socioeconomic History  . Marital status: Single  Spouse name: Not on file  . Number of children: 0  . Years of education: 10  . Highest education level: Not on file  Occupational History  . Not on file  Social Needs  . Financial resource strain: Not on file  . Food insecurity    Worry: Not on file    Inability: Not on file  . Transportation needs    Medical: Not on file    Non-medical: Not on file  Tobacco Use  . Smoking status: Never Smoker  . Smokeless tobacco: Never Used  Substance and Sexual Activity  . Alcohol use: No    Alcohol/week: 0.0 standard drinks  . Drug use: No  . Sexual activity: Not Currently  Lifestyle  . Physical activity    Days per week: Not on file    Minutes per session: Not on file  . Stress: Not on file  Relationships  . Social Herbalist on phone: Not on file    Gets together: Not on file    Attends religious service: Not on file    Active member of club or organization: Not on file    Attends meetings of clubs or organizations: Not on file    Relationship status: Not on file  . Intimate partner violence    Fear of current or ex partner: Not on  file    Emotionally abused: Not on file    Physically abused: Not on file    Forced sexual activity: Not on file  Other Topics Concern  . Not on file  Social History Narrative   Patient lives at home with mom.    Patient does not have any children.    Patient has a 10th grade education.    Patient is single.    Patient is left handed.    Does not have a living will or HPOA- full code.    Past Medical History, Surgical history, Social history, and Family history were reviewed and updated as appropriate.   Please see review of systems for further details on the patient's review from today.   Objective:   Physical Exam:  LMP 12/19/2014 (Exact Date)   Physical Exam Neurological:     Mental Status: She is alert and oriented to person, place, and time.     Cranial Nerves: No dysarthria.  Psychiatric:        Attention and Perception: Attention normal.        Mood and Affect: Mood is anxious and depressed.        Behavior: Behavior is cooperative.        Thought Content: Thought content is paranoid. Thought content is not delusional. Thought content does not include homicidal or suicidal ideation. Thought content does not include homicidal or suicidal plan.        Cognition and Memory: Cognition and memory normal.        Judgment: Judgment normal.     Comments: Minimal verbal interaction. Withdrawn.     Lab Review:     Component Value Date/Time   NA 139 10/13/2017 0901   K 3.8 10/13/2017 0901   CL 110 10/13/2017 0901   CO2 20 (L) 10/13/2017 0901   GLUCOSE 133 (H) 10/13/2017 0901   BUN 10 10/13/2017 0901   CREATININE 0.98 10/13/2017 0901   CALCIUM 8.5 (L) 10/13/2017 0901   PROT 6.4 07/29/2017 1220   ALBUMIN 3.9 07/29/2017 1220   AST 14 07/29/2017 1220   ALT 16 07/29/2017 1220  ALKPHOS 76 07/29/2017 1220   BILITOT 0.4 07/29/2017 1220   GFRNONAA >60 10/13/2017 0901   GFRAA >60 10/13/2017 0901       Component Value Date/Time   WBC 6.0 10/13/2017 0901   RBC 3.89  10/13/2017 0901   HGB 11.6 (L) 10/13/2017 0901   HGB 12.3 11/03/2014 1100   HCT 37.6 10/13/2017 0901   HCT 35.7 11/03/2014 1100   PLT 176 10/13/2017 0901   MCV 96.7 10/13/2017 0901   MCH 29.8 10/13/2017 0901   MCHC 30.9 10/13/2017 0901   RDW 14.3 10/13/2017 0901   LYMPHSABS 1.4 12/19/2016 1400   MONOABS 0.3 12/19/2016 1400   EOSABS 0.5 12/19/2016 1400   BASOSABS 0.0 12/19/2016 1400    No results found for: POCLITH, LITHIUM   No results found for: PHENYTOIN, PHENOBARB, VALPROATE, CBMZ   .res Assessment: Plan:   Case staffed with Dr. Clovis Pu.  Will stop Fanapt due to minimal improvement in s/s. Will start Abilify 7.5 mg po qd since this was most effective for her mood s/s. Will use lower dose since pt's mother reports that side effects did not occur at lower doses in the past and began at 15 mg po qd. Will continue Lexapro 10 mg po qd for depression and anxiety. Will continue Lorazepam prn anxiety.  Pt to f/u in 4 weeks or sooner if clinically indicated. Patient advised to contact office with any questions, adverse effects, or acute worsening in signs and symptoms.  Nancy Hall was seen today for medication problem, anxiety and depression.  Diagnoses and all orders for this visit:  Bipolar depression ([place]) -     ARIPiprazole (ABILIFY) 5 MG tablet; Take 1.5 tablets (7.5 mg total) by mouth daily for 30 days. -     escitalopram (LEXAPRO) 10 MG tablet; Take 1 tablet (10 mg total) by mouth daily.  Other specified anxiety disorders -     escitalopram (LEXAPRO) 10 MG tablet; Take 1 tablet (10 mg total) by mouth daily.    Please see After Visit Summary for patient specific instructions.  Future Appointments  Date Time Provider [place]  10/29/2018  9:00 AM Thayer Headings, PMHNP [facility] None  12/21/2018 10:30 AM Ward Givens, NP [facility] None  01/27/2019 11:00 AM Dennis Bast, RN [facility]  02/23/2019 10:00 AM Philemon Kingdom, MD [facility] None    No orders of  the defined types were placed in this encounter.     -------------------------------

## 2018-09-30 ENCOUNTER — Telehealth: Payer: Self-pay

## 2018-09-30 NOTE — Progress Notes (Unsigned)
Virtual Visit via Video   Due to the COVID-19 pandemic, this visit was completed with telemedicine (audio/video) technology to reduce patient and provider exposure as well as to preserve personal protective equipment.   I connected with Nancy Hall by a video enabled telemedicine application and verified that I am speaking with the correct person using two identifiers. Location patient: Home Location provider: [facility], Office Persons participating in the virtual visit: [name], Arnette Norris, MD Patient's mother, Lashanta Elbe as pt has MR.  I discussed the limitations of evaluation and management by telemedicine and the availability of in person appointments. The patient expressed understanding and agreed to proceed.  Care Team   Patient Care Team: Lucille Passy, MD as PCP - General (Family Medicine) Philemon Kingdom, MD as Consulting Physician (Internal Medicine) Rubie Maid, MD as Referring Physician (Obstetrics and Gynecology) Hassell Done Dicky Doe, NP as Nurse Practitioner (Family Medicine) Ladene Artist, MD as Consulting Physician (Gastroenterology)  Subjective:   HPI:   Right leg swelling- front of right leg from knee to ankle is red, swollen blistery and oozing.  Very painful.  Similar symptoms on the back of her left leg.  Denies any streaking.  ROS   Patient Active Problem List   Diagnosis Date Noted  . RUQ pain 09/17/2018  . Postprandial RUQ pain 09/17/2018  . Right wrist pain 07/09/2018  . Rash and nonspecific skin eruption 06/09/2018  . Left hand pain 03/30/2018  . Tremor 03/30/2018  . 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 Atypical mole 12/29/2017  . Constipation 03/04/2017  . Hot flashes 02/24/2017  . Sciatic leg pain 07/10/2016  . Morbid obesity ([place]) 04/05/2014  . Hyperglycemia 09/21/2013  . Dyslipidemia 09/14/2013  . 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 Hypothyroidism 05/30/2011  . Depression, major, recurrent, severe with psychosis ([place]) 05/30/2011  . MR (mental retardation) 05/30/2011  . Bipolar disorder ([place]) 05/30/2011  . Sleep apnea 05/30/2011  . Seizures ([place])     Social History   Tobacco Use  . Smoking status: Never Smoker  . Smokeless tobacco: Never Used  Substance Use Topics  . Alcohol use: No    Alcohol/week: 0.0 standard drinks    Current Outpatient Medications:  .  acetaminophen (TYLENOL) 325 MG tablet, Take 650 mg by mouth every 6 (six) hours as needed., Disp: , Rfl:  .  ARIPiprazole (ABILIFY) 5 MG tablet, Take 1.5 tablets (7.5 mg total) by mouth daily for 30 days., Disp: 45 tablet, Rfl: 1 .  escitalopram (LEXAPRO) 10 MG tablet, Take 1 tablet (10 mg total) by mouth daily., Disp: 90 tablet, Rfl: 0 .  gabapentin (NEURONTIN) 300 MG capsule, TAKE 1 CAPSULE BY MOUTH EVERYDAY AT BEDTIME, Disp: 90 capsule, Rfl: 1 .  lamoTRIgine (LAMICTAL) 150 MG tablet, Take 1 tablet (150 mg total) by mouth 2 (two) times daily., Disp: 180 tablet, Rfl: 3 .  levothyroxine (SYNTHROID, LEVOTHROID) 137 MCG tablet, Take 1 tablet (137 mcg total) by mouth daily before breakfast., Disp: 60 tablet, Rfl: 3 .  LORazepam (ATIVAN) 0.5 MG tablet, Take 0.5 mg by mouth every 8 (eight) hours. Take 1/2-1 tab po q 6 hours prn, Disp: , Rfl:  .  omeprazole (PRILOSEC) 40 MG capsule, TAKE 1 CAPSULE (40 MG TOTAL) BY MOUTH DAILY., Disp: 90 capsule, Rfl: 0 .  topiramate (TOPAMAX) 100 MG tablet, One tablet in the morning and 1.5 tablets at night, Disp: 225 tablet, Rfl: 3  Allergies  Allergen Reactions  . Penicillins Rash    Has patient had a PCN reaction causing immediate rash, facial/tongue/throat swelling, SOB or lightheadedness with hypotension: Yes Has patient had a PCN reaction causing severe rash involving mucus membranes or skin necrosis: No Has patient had a PCN reaction that required hospitalization: No Has patient had a PCN reaction occurring within the  last 10 years: Yes If all of the above answers are "NO", then may proceed with Cephalosporin use.  3g Ancef administered 10/21/2017 without reaction/complic    Objective:   VITALS: Per patient if applicable, see vitals. GENERAL: Alert, appears well and in no acute distress. HEENT: Atraumatic, conjunctiva clear, no obvious abnormalities on inspection of external nose and ears. NECK: Normal movements of the head and neck. CARDIOPULMONARY: No increased WOB. Speaking in clear sentences. I:E ratio WNL.  MS: Moves all visible extremities without noticeable abnormality. PSYCH: Pleasant and cooperative, well-groomed. Speech normal rate and rhythm. Affect is appropriate. Insight and judgement are appropriate. Attention is focused, linear, and appropriate.  NEURO: CN grossly intact. Oriented as arrived to appointment on time with no prompting. Moves both UE equally.  SKIN: No obvious lesions, wounds, erythema, or cyanosis noted on face or hands.  Depression screen [facility] 2/9 01/21/2018 12/19/2016 06/27/2015  Decreased Interest 1 1 1   Down, Depressed, Hopeless 0 1 1  PHQ - 2 Score 1 2 2   Altered sleeping - 1 -  Tired, decreased energy - 1 -  Change in appetite - 1 -  Feeling bad or failure about yourself  - 1 -  Trouble concentrating - 1 -  Moving slowly or fidgety/restless - 1 -  Suicidal thoughts - 0 -  PHQ-9 Score - 8 -  Difficult doing work/chores - Somewhat difficult -    Assessment and Plan:   There are no diagnoses linked to this encounter.  [facility] COVID-19 Education: The signs and symptoms of COVID-19 were discussed with the patient and how to seek care for testing if needed. The importance of social distancing was discussed today. . Reviewed expectations re: course of current medical issues. . Discussed self-management of symptoms. . Outlined signs and symptoms indicating need for more acute intervention. . Patient verbalized understanding and all questions were answered. [facility] Health  Maintenance issues including appropriate healthy diet, exercise, and smoking avoidance were discussed with patient. . See orders for this visit as documented in the electronic medical record.  Arnette Norris, MD  Records requested if needed. Time spent: *** minutes, of which >50% was spent in obtaining information about her symptoms, reviewing her previous labs, evaluations, and treatments, counseling her about her condition (please see the discussed topics above), and developing a plan to further investigate it; she had a number of questions which I addressed.

## 2018-09-30 NOTE — Telephone Encounter (Signed)
Prior authorization approved through [facility] for Aripiprazole 5 mg #45 per 30 days effective 09/25/2018 - until further notice.

## 2018-10-01 ENCOUNTER — Telehealth: Payer: Self-pay

## 2018-10-01 ENCOUNTER — Telehealth: Payer: Medicare Other | Admitting: Family Medicine

## 2018-10-01 ENCOUNTER — Ambulatory Visit (INDEPENDENT_AMBULATORY_CARE_PROVIDER_SITE_OTHER): Payer: Medicare Other | Admitting: Family Medicine

## 2018-10-01 ENCOUNTER — Encounter: Payer: Self-pay | Admitting: Family Medicine

## 2018-10-01 VITALS — BP 124/70 | HR 101 | Ht 66.0 in | Wt 301.0 lb

## 2018-10-01 DIAGNOSIS — L0103 Bullous impetigo: Secondary | ICD-10-CM | POA: Diagnosis not present

## 2018-10-01 DIAGNOSIS — L280 Lichen simplex chronicus: Secondary | ICD-10-CM | POA: Diagnosis not present

## 2018-10-01 DIAGNOSIS — L0100 Impetigo, unspecified: Secondary | ICD-10-CM | POA: Insufficient documentation

## 2018-10-01 MED ORDER — CEPHALEXIN 500 MG PO CAPS: 500.0000 mg | ORAL_CAPSULE | Freq: Three times a day (TID) | ORAL | 0 refills | Status: AC

## 2018-10-01 NOTE — Progress Notes (Signed)
Established Patient Office Visit  Subjective:  Patient ID: Nancy Hall, female    DOB: 1970/06/27  Age: 48 y.o. MRN: [id number]  CC:  Chief Complaint  Patient presents with  . Leg Swelling    No streaking, have been draining, mom has been keeping them wrapped    HPI Nancy Hall presents for evaluation and treatment of a rash that is mostly on her legs.  It has been spreading.  There is been blister formation.  Patient admits to some chronic picking of her skin.  Mother says that patient does not sleep well at night and tosses and turns.  There is been no fever or chills.  Mom has been keeping the legs wrapped.  Patient is now allergic with a rash.  She is taking Ancef and Keflex without issue.  Past Medical History:  Diagnosis Date  . Anemia 2016  . Anxiety   . Bipolar disorder ([place])   . Cancer ([place])   . Complication of anesthesia   . Depression   . Epigastric hernia   . Esophagitis    LA Class A  . GERD (gastroesophageal reflux disease)   . Hiatal hernia   . HOH (hard of hearing)   . Hypothyroidism   . Learning disability   . Obesity   . PONV (postoperative nausea and vomiting)   . Pseudoseizures   . Seizure ([place]) 2014   last seizure 2(two) years ago  . Seizures ([place])    according to echart- seizures vs pseudoseizures  . Sleep apnea    No cpap  . Thyroid disease   . Ventral hernia     Past Surgical History:  Procedure Laterality Date  . ABDOMINAL HYSTERECTOMY N/A 01/09/2015   Procedure: HYSTERECTOMY ABDOMINAL/BILATERAL SALPINGECTOMY;  Surgeon: Rubie Maid, MD;  Location: [facility];  Service: Gynecology;  Laterality: N/A;  . DILATION AND CURETTAGE OF UTERUS    . HYSTEROSCOPY W/D&C N/A 12/19/2014   Procedure: DILATATION AND CURETTAGE /HYSTEROSCOPY;  Surgeon: Rubie Maid, MD;  Location: [facility];  Service: Gynecology;  Laterality: N/A;  . INNER EAR SURGERY Bilateral 1994   poor historian  . INSERTION OF MESH N/A 10/21/2017   Procedure: INSERTION OF MESH;   Surgeon: Donnie Mesa, MD;  Location: [place];  Service: General;  Laterality: N/A;  . TONSILLECTOMY    . VENTRAL HERNIA REPAIR N/A 10/21/2017   Procedure: OPEN REPAIR EPIGASTIC VENTRAL HERNIA WITH  MESH ERAS PATHWAY;  Surgeon: Donnie Mesa, MD;  Location: [place];  Service: General;  Laterality: N/A;    Family History  Problem Relation Age of Onset  . Colon polyps Mother   . Celiac disease Mother   . Depression Mother   . Anxiety disorder Mother   . Colon polyps Father   . Celiac disease Brother   . Diabetes Sister   . Depression Sister   . Depression Brother   . CVA Brother   . Diabetes Paternal Grandmother   . Heart disease Paternal Grandfather   . Colon polyps Paternal Aunt   . Diabetes Maternal Aunt   . Heart disease Maternal Grandfather   . Heart disease Maternal Uncle   . Heart disease Maternal Aunt   . Seizures Neg Hx     Social History   Socioeconomic History  . Marital status: Single    Spouse name: Not on file  . Number of children: 0  . Years of education: 10  . Highest education level: Not on file  Occupational History  .  Not on file  Social Needs  . Financial resource strain: Not on file  . Food insecurity    Worry: Not on file    Inability: Not on file  . Transportation needs    Medical: Not on file    Non-medical: Not on file  Tobacco Use  . Smoking status: Never Smoker  . Smokeless tobacco: Never Used  Substance and Sexual Activity  . Alcohol use: No    Alcohol/week: 0.0 standard drinks  . Drug use: No  . Sexual activity: Not Currently  Lifestyle  . Physical activity    Days per week: Not on file    Minutes per session: Not on file  . Stress: Not on file  Relationships  . Social Herbalist on phone: Not on file    Gets together: Not on file    Attends religious service: Not on file    Active member of club or organization: Not on file    Attends meetings of clubs or organizations: Not on file    Relationship status: Not on  file  . Intimate partner violence    Fear of current or ex partner: Not on file    Emotionally abused: Not on file    Physically abused: Not on file    Forced sexual activity: Not on file  Other Topics Concern  . Not on file  Social History Narrative   Patient lives at home with mom.    Patient does not have any children.    Patient has a 10th grade education.    Patient is single.    Patient is left handed.    Does not have a living will or HPOA- full code.    Outpatient Medications Prior to Visit  Medication Sig Dispense Refill  . acetaminophen (TYLENOL) 325 MG tablet Take 650 mg by mouth every 6 (six) hours as needed.    . ARIPiprazole (ABILIFY) 5 MG tablet Take 1.5 tablets (7.5 mg total) by mouth daily for 30 days. 45 tablet 1  . escitalopram (LEXAPRO) 10 MG tablet Take 1 tablet (10 mg total) by mouth daily. 90 tablet 0  . gabapentin (NEURONTIN) 300 MG capsule TAKE 1 CAPSULE BY MOUTH EVERYDAY AT BEDTIME 90 capsule 1  . lamoTRIgine (LAMICTAL) 150 MG tablet Take 1 tablet (150 mg total) by mouth 2 (two) times daily. 180 tablet 3  . levothyroxine (SYNTHROID, LEVOTHROID) 137 MCG tablet Take 1 tablet (137 mcg total) by mouth daily before breakfast. 60 tablet 3  . LORazepam (ATIVAN) 0.5 MG tablet Take 0.5 mg by mouth every 8 (eight) hours. Take 1/2-1 tab po q 6 hours prn    . omeprazole (PRILOSEC) 40 MG capsule TAKE 1 CAPSULE (40 MG TOTAL) BY MOUTH DAILY. 90 capsule 0  . topiramate (TOPAMAX) 100 MG tablet One tablet in the morning and 1.5 tablets at night 225 tablet 3   No facility-administered medications prior to visit.     Allergies  Allergen Reactions  . Penicillins Rash    Has patient had a PCN reaction causing immediate rash, facial/tongue/throat swelling, SOB or lightheadedness with hypotension: Yes Has patient had a PCN reaction causing severe rash involving mucus membranes or skin necrosis: No Has patient had a PCN reaction that required hospitalization: No Has patient had  a PCN reaction occurring within the last 10 years: Yes If all of the above answers are "NO", then may proceed with Cephalosporin use.  3g Ancef administered 10/21/2017 without reaction/complic  ROS Review of Systems  Constitutional: Negative for chills, diaphoresis, fatigue, fever and unexpected weight change.  Respiratory: Negative.   Cardiovascular: Negative.   Gastrointestinal: Negative.   Skin: Positive for color change, rash and wound.  Psychiatric/Behavioral: Positive for dysphoric mood. The patient is nervous/anxious.       Objective:    Physical Exam  Constitutional: She is oriented to person, place, and time. She appears well-developed and well-nourished. No distress.  HENT:  Head: Normocephalic and atraumatic.  Right Ear: External ear normal.  Left Ear: External ear normal.  Eyes: Conjunctivae are normal. Right eye exhibits no discharge. Left eye exhibits no discharge. No scleral icterus.  Neck: No JVD present. No tracheal deviation present.  Pulmonary/Chest: Effort normal. No stridor.  Neurological: She is alert and oriented to person, place, and time.  Skin: Rash noted. She is not diaphoretic. There is erythema.     Psychiatric: She has a normal mood and affect. Her behavior is normal.    BP 124/70   Pulse (!) 101   Ht 5\' 6"  (1.676 m)   Wt (!) 301 lb (136.5 kg)   LMP 12/19/2014 (Exact Date)   SpO2 96%   BMI 48.58 kg/m  Wt Readings from Last 3 Encounters:  10/01/18 (!) 301 lb (136.5 kg)  07/09/18 288 lb 12.8 oz (131 kg)  06/09/18 287 lb 6.4 oz (130.4 kg)   BP Readings from Last 3 Encounters:  10/01/18 124/70  07/09/18 126/84  06/09/18 120/70   Guideline developer:  UpToDate (see UpToDate for funding source) Date Released: June 2014  There are no preventive care reminders to display for this patient.  There are no preventive care reminders to display for this patient.  Lab Results  Component Value Date   TSH 2.96 02/24/2018   Lab Results   Component Value Date   WBC 6.0 10/13/2017   HGB 11.6 (L) 10/13/2017   HCT 37.6 10/13/2017   MCV 96.7 10/13/2017   PLT 176 10/13/2017   Lab Results  Component Value Date   NA 139 10/13/2017   K 3.8 10/13/2017   CO2 20 (L) 10/13/2017   GLUCOSE 133 (H) 10/13/2017   BUN 10 10/13/2017   CREATININE 0.98 10/13/2017   BILITOT 0.4 07/29/2017   ALKPHOS 76 07/29/2017   AST 14 07/29/2017   ALT 16 07/29/2017   PROT 6.4 07/29/2017   ALBUMIN 3.9 07/29/2017   CALCIUM 8.5 (L) 10/13/2017   ANIONGAP 9 10/13/2017   GFR 67.67 07/29/2017   Lab Results  Component Value Date   CHOL 179 12/29/2017   Lab Results  Component Value Date   HDL 36.40 (L) 12/29/2017   Lab Results  Component Value Date   LDLCALC 119 (H) 12/29/2017   Lab Results  Component Value Date   TRIG 118.0 12/29/2017   Lab Results  Component Value Date   CHOLHDL 5 12/29/2017   Lab Results  Component Value Date   HGBA1C 5.9 12/29/2017      Assessment & Plan:   Problem List Items Addressed This Visit      Musculoskeletal and Integument   Impetigo bullosa - Primary   Relevant Medications   cephALEXin (KEFLEX) 500 MG capsule     Other   Neurodermatitis      Meds ordered this encounter  Medications  . cephALEXin (KEFLEX) 500 MG capsule    Sig: Take 1 capsule (500 mg total) by mouth 3 (three) times daily for 10 days.    Dispense:  30 capsule  Refill:  0    Follow-up: Return in about 1 week (around 10/08/2018), or if symptoms worsen or fail to improve.   Information was given on impetigo and neurodermatitis.  Mom will keep the legs covered to prevent further spread.  Advised that this rash is contagious.  Mother is immunocompromised and will take appropriate precautions.  Follow-up PRN

## 2018-10-01 NOTE — Telephone Encounter (Signed)
Questions for Screening COVID-19  Symptom onset: None  Travel or Contacts: None  During this illness, did/does the patient experience any of the following symptoms? Fever >100.68F []   Yes [x]   No []   Unknown Subjective fever (felt feverish) []   Yes [x]   No []   Unknown Chills []   Yes [x]   No []   Unknown Muscle aches (myalgia) []   Yes [x]   No []   Unknown Runny nose (rhinorrhea) []   Yes [x]   No []   Unknown Sore throat []   Yes [x]   No []   Unknown Cough (new onset or worsening of chronic cough) []   Yes []   No []   Unknown Shortness of breath (dyspnea) []   Yes [x]   No []   Unknown Nausea or vomiting []   Yes [x]   No []   Unknown Headache []   Yes [x]   No []   Unknown Abdominal pain  []   Yes [x]   No []   Unknown Diarrhea (?3 loose/looser than normal stools/24hr period) []   Yes [x]   No []   Unknown Other, specify:  Patient risk factors: Smoker? []   Current []   Former []   Never If female, currently pregnant? []   Yes []   No  Patient Active Problem List   Diagnosis Date Noted  . RUQ pain 09/17/2018  . Postprandial RUQ pain 09/17/2018  . Right wrist pain 07/09/2018  . Rash and nonspecific skin eruption 06/09/2018  . Left hand pain 03/30/2018  . Tremor 03/30/2018  . 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 Atypical mole 12/29/2017  . Constipation 03/04/2017  . Hot flashes 02/24/2017  . Sciatic leg pain 07/10/2016  . Morbid obesity ([place]) 04/05/2014  . Hyperglycemia 09/21/2013  . Dyslipidemia 09/14/2013  . 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 Hypothyroidism 05/30/2011  . Depression, major, recurrent, severe with psychosis ([place]) 05/30/2011  . MR (mental retardation) 05/30/2011  . Bipolar disorder ([place]) 05/30/2011  . Sleep apnea 05/30/2011  . Seizures ([place])     Plan:  []   High risk for COVID-19 with red flags go to ED (with CP, SOB, weak/lightheaded, or fever > 101.5). Call ahead.  []   High risk for COVID-19 but  stable. Inform provider and coordinate time for [facility] visit.   []   No red flags but URI signs or symptoms okay for [facility] visit.

## 2018-10-01 NOTE — Patient Instructions (Signed)
Impetigo, Adult Impetigo is an infection of the skin. It commonly occurs in young children, but it can also occur in adults. The infection causes itchy blisters and sores that produce brownish-yellow fluid. As the fluid dries, it forms a thick, honey-colored crust. These skin changes usually occur on the face, but they can also affect other areas of the body. Impetigo usually goes away in 7-10 days with treatment. What are the causes? This condition is caused by two types of bacteria. It may be caused by staphylococci or streptococci bacteria. These bacteria cause impetigo when they get under the surface of the skin. This often happens after some damage to the skin, such as:  Cuts, scrapes, or scratches.  Rashes.  Insect bites, especially when you scratch the area of a bite.  Chickenpox or other illnesses that cause open skin sores.  Nail biting or chewing. Impetigo can spread easily from one person to another (is contagious). It may be spread through close skin contact or by sharing towels, clothing, or other items that an infected person has touched. What increases the risk? The following factors may make you more likely to develop this condition:  Playing sports that include skin-to-skin contact with others.  Having a skin condition with open sores, such as chickenpox.  Having diabetes.  Having a weak body defense system (immune system).  Having many skin cuts or scrapes.  Living in an area that has high humidity levels.  Having poor hygiene.  Having high levels of staphylococci in your nose. What are the signs or symptoms? The main symptom of this condition is small blisters, often on the face around the mouth and nose. In time, the blisters break open and turn into tiny sores (lesions) with a yellow crust. In some cases, the blisters cause itching or burning. With scratching, irritation, or lack of treatment, these small lesions may get larger. Other possible symptoms  include:  Larger blisters.  Pus.  Swollen lymph glands. Scratching the affected area can cause impetigo to spread to other parts of the body. The bacteria can get under the fingernails and spread when you touch another area of your skin. How is this diagnosed? This condition is usually diagnosed during a physical exam. A skin sample or a sample of fluid from a blister may be taken for lab tests that involve growing bacteria (culture test). Lab tests can help to confirm the diagnosis or help to determine the best treatment. How is this treated? Treatment for this condition depends on the severity of the condition:  Mild impetigo can be treated with prescription antibiotic cream.  Oral antibiotic medicine may be used in more severe cases.  Medicines that reduce itchiness (antihistamines)may also be used. Follow these instructions at home: Medicines  Take over-the-counter and prescription medicines only as told by your health care provider.  Apply or take your antibiotic as told by your health care provider. Do not stop using the antibiotic even if your condition improves. General instructions   To help prevent impetigo from spreading to other body areas: ? Keep your fingernails short and clean. ? Do not scratch the blisters or sores. ? Cover infected areas, if necessary, to keep from scratching. ? Wash your hands often with soap and warm water.  Before applying antibiotic cream or ointment, you should: ? Gently wash the infected areas with antibacterial soap and warm water. ? Soak crusted areas in warm, soapy water using antibacterial soap. ? Gently rub the areas to remove crusts. Do not  scrub.  Do not share towels.  Wash your clothing and bedsheets in warm water that is 140F (60C) or warmer.  Stay home until you have used an antibiotic cream for 48 hours (2 days) or an oral antibiotic medicine for 24 hours (1 day). You should only return to work and activities with other  people if your skin shows significant improvement. ? You may return to contact sports after you have used antibiotic medicine for 72 hours (3 days).  Keep all follow-up visits as told by your health care provider. This is important. How is this prevented?  Wash your hands often with soap and warm water.  Do not share towels, washcloths, clothing, bedding, or razors.  Keep your fingernails short.  Keep any cuts, scrapes, bug bites, or rashes clean and covered.  Use insect repellent to prevent bug bites. Contact a health care provider if:  You develop more blisters or sores even with treatment.  Other family members get sores.  Your skin sores are not improving after 72 hours (3 days) of treatment.  You have a fever. Get help right away if:  You see spreading redness or swelling of the skin around your sores.  You see red streaks coming from your sores.  You develop a sore throat.  The area around your rash becomes warm, red, or tender to the touch.  You have dark, reddish-brown urine.  You do not urinate often or you urinate small amounts.  You are very tired (lethargic).  You have swelling in the face, hands, or feet. Summary  Impetigo is a skin infection that causes itchy blisters and sores that produce brownish-yellow fluid. As the fluid dries, it forms a crust.  This condition is caused by staphylococci or streptococci bacteria. These bacteria cause impetigo when they get under the surface of the skin, such as through cuts, rashes, bug bites, or open sores.  Treatment for this condition may include antibiotic ointment or oral antibiotics.  To help prevent impetigo from spreading to other body areas, make sure you keep your fingernails short, avoid scratching, cover any blisters, and wash your hands often.  If you have impetigo, stay home until you have used an antibiotic cream for 48 hours (2 days) or an oral antibiotic medicine for 24 hours (1 day). You should  only return to work and activities with other people if your skin shows significant improvement. This information is not intended to replace advice given to you by your health care provider. Make sure you discuss any questions you have with your health care provider. Document Released: 04/08/2014 Document Revised: 04/28/2018 Document Reviewed: 04/09/2016 Elsevier Patient Education  2020 [employer].  Neurodermatitis Neurodermatitis is an inflammation and thickening of the skin. It is caused by severe itchiness, which leads to repeated scratching and rubbing of the skin. It can happen anywhere on the body. Common places include the neck, head, arms, and legs. Neurodermatitis may have mental or emotional (psychogenic) causes that may need treatment. Usually, this is a lifelong (chronic) condition, but in some cases it may go away on its own. What are the causes? This condition is caused by repeated scratching and rubbing of the skin. This happens because of feelings of severe itchiness. Often, the cause of itchiness is not known. Common causes include:  Materials or substances that irritate the skin.  Skin conditions, such as chronic dermatitis or eczema.  Allergic reaction. In some cases, neurodermatitis may have psychogenic causes, like anxiety or stress, that may need  to be treated. What increases the risk? You are more likely to develop this condition if:  You have dry skin or other skin conditions.  You have an anxiety disorder or a lot of stress.  You are 95-19 years old.  You are a woman.  You are exposed to irritating chemicals. What are the signs or symptoms? The main symptom of this condition is one or more patches of skin that are red, swollen, itchy, and thicker than normal.  These patches can be anywhere on the body, but are often on the head, neck, legs, and arms.  This condition can also affect the genital and anal areas.  In severe cases, bleeding, crusting, and  scaling of the skin can occur. Symptoms often come and go over time. The frequency and severity of symptoms varies from person to person. How is this diagnosed? This condition is diagnosed based on your medical history and a physical exam of your skin. You may be referred to a health care provider who specializes in skin care (dermatologist). You may also have tests, including:  Skin allergy tests. These tests will determine if you have an allergy that is causing your condition.  Blood or other lab tests. These tests can help to determine if your itching is caused by an infection or other condition. In some cases, your health care provider may remove a small amount of skin cells to be examined under a microscope (biopsy). How is this treated? This condition is managed by stopping all scratching and rubbing of the affected area. It is also important to remove all skin irritants and treat any underlying causes of neurodermatitis. Depending on the cause, your health care provider may recommend certain treatments such as:  Creams, lotions, or pills to reduce inflammation and itching (corticosteroids).  Medicines to prevent or treat infection (antibiotics).  Medicines to relieve allergy symptoms (antihistamines).  Therapy to learn how to stop feelings of itchiness and stop scratching (behavioral therapy or psychotherapy). Your health care provider may recommend a doctor who specializes in human behavior (psychologist).  Phototherapy. This condition sometimes goes away without treatment. Follow these instructions at home: Skin care   Avoid scratching and rubbing your skin. This is the best way to manage neurodermatitis and prevent it from returning.  Keep your skin clean and moisturized. ? Avoid very hot water. ? Apply lotion at least one time per day. ? Avoid products, such as soaps and lotions, that have harsh chemicals, scents, and dyes. ? Try to shower and take baths only as often as  you need to. Frequent bathing can dry out your skin.  Avoid tight or rough clothing that irritates your skin.  Apply cool washcloths to your skin to help reduce itching. General instructions  Use over-the-counter and prescription medicines only as told by your health care provider.  Drink enough fluid to keep your urine pale yellow.  Avoid irritating chemicals.  Pay attention to your symptoms. Watch for things that trigger itching and scratching.  Keep your fingernails cut short to reduce injury from scratching.  Keep all follow-up visits as told by your health care provider. This is important. Contact a health care provider if:  Your condition gets worse or does not get better after 3-4 days of treatment.  You have blood or fluid leaking from an irritated patch of skin.  You have a fever. Summary  Neurodermatitis is an inflammation and thickening of the skin. It is caused by severe itchiness, which leads to repeated scratching  and rubbing of the skin.  Depending on the cause, your health care provider may recommend certain treatments such as creams, lotions, and pills to reduce inflammation and other medicines to treat infection, if needed.  Treatment may also include therapy to learn how to stop feelings of itchiness and stop scratching (behavioral therapy or psychotherapy).  Use over-the-counter and prescription medicines only as told by your health care provider.  Keep all follow-up visits as told by your health care provider. This is important. This information is not intended to replace advice given to you by your health care provider. Make sure you discuss any questions you have with your health care provider. Document Released: 04/25/2004 Document Revised: 07/10/2018 Document Reviewed: 08/06/2017 Elsevier Patient Education  2020 [employer].

## 2018-10-09 ENCOUNTER — Ambulatory Visit: Payer: Self-pay | Admitting: Family Medicine

## 2018-10-09 NOTE — Telephone Encounter (Signed)
Dr. Ethelene Hal - please advise. Pt is still have swelling. The rash is drying up and there is no redness to the skin any longer. She wants to know what else she can do for the swelling besides elevating her legs. This is ongoing from when we saw her.

## 2018-10-09 NOTE — Telephone Encounter (Signed)
Lets have her try compression stockings.

## 2018-10-09 NOTE — Telephone Encounter (Signed)
  Pt. And mother report she still has swelling to both feet and ankles. States it is the same as her last visit to Dr. Ethelene Hal. Rash seems better - "it is drying up." No redness to skin, no chest pain or shortness of breath. Would like to know what to do for the swelling, other than elevating her legs. Please advise. Answer Assessment - Initial Assessment Questions 1. ONSET: "When did the swelling start?" (e.g., minutes, hours, days)     Started last week 2. LOCATION: "What part of the leg is swollen?"  "Are both legs swollen or just one leg?"     Feet and ankles 3. SEVERITY: "How bad is the swelling?" (e.g., localized; mild, moderate, severe)  - Localized - small area of swelling localized to one leg  - MILD pedal edema - swelling limited to foot and ankle, pitting edema < 1/4 inch (6 mm) deep, rest and elevation eliminate most or all swelling  - MODERATE edema - swelling of lower leg to knee, pitting edema > 1/4 inch (6 mm) deep, rest and elevation only partially reduce swelling  - SEVERE edema - swelling extends above knee, facial or hand swelling present      Moderate 4. REDNESS: "Does the swelling look red or infected?"     No 5. PAIN: "Is the swelling painful to touch?" If so, ask: "How painful is it?"   (Scale 1-10; mild, moderate or severe)     7 6. FEVER: "Do you have a fever?" If so, ask: "What is it, how was it measured, and when did it start?"      No 7. CAUSE: "What do you think is causing the leg swelling?"     Unsure 8. MEDICAL HISTORY: "Do you have a history of heart failure, kidney disease, liver failure, or cancer?"     No 9. RECURRENT SYMPTOM: "Have you had leg swelling before?" If so, ask: "When was the last time?" "What happened that time?"     No 10. OTHER SYMPTOMS: "Do you have any other symptoms?" (e.g., chest pain, difficulty breathing)       No 11. PREGNANCY: "Is there any chance you are pregnant?" "When was your last menstrual period?"       No  Protocols used:  LEG SWELLING AND EDEMA-A-AH

## 2018-10-09 NOTE — Telephone Encounter (Signed)
I spoke with pt's mom. I advised for her to try the compression stockings and to let us know if she has not improved by Monday. Patient's mom verbalized understanding.

## 2018-10-09 NOTE — Telephone Encounter (Signed)
Copied from [place] [id number]. Topic: General - Other >> Oct 09, 2018 11:19 AM Jodie Echevaria wrote: Reason for CRM: Patient mother Nancy Hall called to say that the patient is having swelling in her feet and ankles almost can not fit her shoes asking what to do. Please call Ph# [phone]

## 2018-10-29 ENCOUNTER — Ambulatory Visit (INDEPENDENT_AMBULATORY_CARE_PROVIDER_SITE_OTHER): Payer: Medicare Other | Admitting: Psychiatry

## 2018-10-29 ENCOUNTER — Other Ambulatory Visit: Payer: Self-pay

## 2018-10-29 DIAGNOSIS — F418 Other specified anxiety disorders: Secondary | ICD-10-CM | POA: Diagnosis not present

## 2018-10-29 DIAGNOSIS — F319 Bipolar disorder, unspecified: Secondary | ICD-10-CM | POA: Diagnosis not present

## 2018-10-29 MED ORDER — ESCITALOPRAM OXALATE 10 MG PO TABS: 10.0000 mg | ORAL_TABLET | Freq: Every day | ORAL | 0 refills | Status: DC

## 2018-10-29 MED ORDER — LORAZEPAM 0.5 MG PO TABS: ORAL_TABLET | ORAL | 1 refills | Status: DC

## 2018-10-29 MED ORDER — REXULTI 0.5 MG PO TABS: 0.5000 mg | ORAL_TABLET | Freq: Every day | ORAL | 0 refills | Status: DC

## 2018-10-29 NOTE — Progress Notes (Signed)
MORGHAN KESTER [id number] 12-11-1970 48 y.o.  Subjective:   Patient ID:  Nancy Hall is a 48 y.o. (DOB April 21, 1970) female.  Chief Complaint:  Chief Complaint  Patient presents with  . Medication Problem    Involuntary orofacial movements with Abilify  . Anxiety  . Paranoid    HPI Nancy Hall presents to the office today for follow-up of anxiety, depression, and paranoia. Mother reports that pt was "doing a little better but her mouth started shaking. Mother reports that pt did not have UE shaking. Mother reports that pt ran out of Abilify about a week ago and involuntary mouth movement seem to have resolved.   Mother reports that pt has been needing Ativan prn about twice daily for anxiety and agitation. Mother reports that pt was less agitated with Abilify. Pt reports that she notices "some" anxiety. Mother reports that pt continues to think everyone is against her and it was slightly better on Abilify. Mother reports that pt is getting along well with pt's nephew now.  Mother reports that pt had recent confrontation with one family member and said her thoughts and then laughed it off. Mother and pt report that pt's motivation and energy have improved and she has been cleaning more. They report that her sleep has improved. Pt reports occ sad mood when she thinks about the loss of her father. Some improvement in irritability. Pt continues to go to her room early in the evening immediately after dinner.   Weight gain is a concern. Increased appetite on Abilify. Denies SI.   Saw PCP for impetigo on 10/01/18. They report that pt has been picking at her skin less often the last   Past medication trials: Abilify-helpful for mood signs and symptoms. Had tremor and involuntary orofacial movements. Possible weight gain. Vraylar- Had tremor and oral facial movements Latuda-ineffective for mood signs and symptoms. No tolerability issues. Fanapt Sertraline-has taken long-term. May have  been helpful for depression. Lamictal-prescribed for seizures by neurologist. Patient has taken long-term and effect on mood is unclear. Topamax-taken long-term and was started for seizures. Depakote-prescribed for seizures in the past. Lithium- tremor, hot flashes, worsening mood.  Review of Systems:  Review of Systems  Musculoskeletal: Negative for gait problem.  Skin:       Recent impetigo on lower extremities  Neurological: Negative for tremors.  Psychiatric/Behavioral:       Please refer to HPI    Medications: I have reviewed the patient's current medications.  Current Outpatient Medications  Medication Sig Dispense Refill  . acetaminophen (TYLENOL) 325 MG tablet Take 650 mg by mouth every 6 (six) hours as needed.    . Brexpiprazole (REXULTI) 0.5 MG TABS Take 1 tablet (0.5 mg total) by mouth daily. 30 tablet 0  . escitalopram (LEXAPRO) 10 MG tablet Take 1 tablet (10 mg total) by mouth daily. 90 tablet 0  . gabapentin (NEURONTIN) 300 MG capsule TAKE 1 CAPSULE BY MOUTH EVERYDAY AT BEDTIME 90 capsule 1  . lamoTRIgine (LAMICTAL) 150 MG tablet Take 1 tablet (150 mg total) by mouth 2 (two) times daily. 180 tablet 3  . levothyroxine (SYNTHROID, LEVOTHROID) 137 MCG tablet Take 1 tablet (137 mcg total) by mouth daily before breakfast. 60 tablet 3  . LORazepam (ATIVAN) 0.5 MG tablet Take 1/2-1 tab po q 6 hours prn 60 tablet 1  . omeprazole (PRILOSEC) 40 MG capsule TAKE 1 CAPSULE (40 MG TOTAL) BY MOUTH DAILY. 90 capsule 0  . topiramate (TOPAMAX) 100 MG tablet One  tablet in the morning and 1.5 tablets at night 225 tablet 3   No current facility-administered medications for this visit.     Medication Side Effects: Other: Weight gain, Involuntary movements  Allergies:  Allergies  Allergen Reactions  . Penicillins Rash    Has patient had a PCN reaction causing immediate rash, facial/tongue/throat swelling, SOB or lightheadedness with hypotension: Yes Has patient had a PCN reaction  causing severe rash involving mucus membranes or skin necrosis: No Has patient had a PCN reaction that required hospitalization: No Has patient had a PCN reaction occurring within the last 10 years: Yes If all of the above answers are "NO", then may proceed with Cephalosporin use.  3g Ancef administered 10/21/2017 without reaction/complic    Past Medical History:  Diagnosis Date  . Anemia 2016  . Anxiety   . Bipolar disorder ([place])   . Cancer ([place])   . Complication of anesthesia   . Depression   . Epigastric hernia   . Esophagitis    LA Class A  . GERD (gastroesophageal reflux disease)   . Hiatal hernia   . HOH (hard of hearing)   . Hypothyroidism   . Learning disability   . Obesity   . PONV (postoperative nausea and vomiting)   . Pseudoseizures   . Seizure ([place]) 2014   last seizure 2(two) years ago  . Seizures ([place])    according to echart- seizures vs pseudoseizures  . Sleep apnea    No cpap  . Thyroid disease   . Ventral hernia     Family History  Problem Relation Age of Onset  . Colon polyps Mother   . Celiac disease Mother   . Depression Mother   . Anxiety disorder Mother   . Colon polyps Father   . Celiac disease Brother   . Diabetes Sister   . Depression Sister   . Depression Brother   . CVA Brother   . Diabetes Paternal Grandmother   . Heart disease Paternal Grandfather   . Colon polyps Paternal Aunt   . Diabetes Maternal Aunt   . Heart disease Maternal Grandfather   . Heart disease Maternal Uncle   . Heart disease Maternal Aunt   . Seizures Neg Hx     Social History   Socioeconomic History  . Marital status: Single    Spouse name: Not on file  . Number of children: 0  . Years of education: 10  . Highest education level: Not on file  Occupational History  . Not on file  Social Needs  . Financial resource strain: Not on file  . Food insecurity    Worry: Not on file    Inability: Not on file  . Transportation needs    Medical: Not on file     Non-medical: Not on file  Tobacco Use  . Smoking status: Never Smoker  . Smokeless tobacco: Never Used  Substance and Sexual Activity  . Alcohol use: No    Alcohol/week: 0.0 standard drinks  . Drug use: No  . Sexual activity: Not Currently  Lifestyle  . Physical activity    Days per week: Not on file    Minutes per session: Not on file  . Stress: Not on file  Relationships  . Social Herbalist on phone: Not on file    Gets together: Not on file    Attends religious service: Not on file    Active member of club or organization: Not on file  Attends meetings of clubs or organizations: Not on file    Relationship status: Not on file  . Intimate partner violence    Fear of current or ex partner: Not on file    Emotionally abused: Not on file    Physically abused: Not on file    Forced sexual activity: Not on file  Other Topics Concern  . Not on file  Social History Narrative   Patient lives at home with mom.    Patient does not have any children.    Patient has a 10th grade education.    Patient is single.    Patient is left handed.    Does not have a living will or HPOA- full code.   ETHELMAE RINGEL [id number] 09/25/1970 48 y.o.  Virtual Visit via Telephone Note  I connected with pt on 09/25/18 at  9:00 AM EDT by telephone and verified that I am speaking with the correct person using two identifiers.   I discussed the limitations, risks, security and privacy concerns of performing an evaluation and management service by telephone and the availability of in person appointments. I also discussed with the patient that there may be a patient responsible charge related to this service. The patient expressed understanding and agreed to proceed.   I discussed the assessment and treatment plan with the patient. The patient was provided an opportunity to ask questions and all were answered. The patient agreed with the plan and demonstrated an understanding of the  instructions.   The patient was advised to call back or seek an in-person evaluation if the symptoms worsen or if the condition fails to improve as anticipated.  I provided 30 minutes of non-face-to-face time during this encounter.  The patient was located at home.  The provider was located at [place].   Thayer Headings, PMHNP   Subjective:   Patient ID:  Nancy Hall is a 48 y.o. (DOB Nov 24, 1970) female.  Chief Complaint:  Chief Complaint  Patient presents with  . Medication Problem    Involuntary orofacial movements with Abilify  . Anxiety  . Paranoid    HPI Nancy Hall presents for follow-up of mood and anxiety. Mother is present on exam. Mother reports that pt has been "fussy, angry." Mother reports that pt has been very irritable and telling family members that they don't like her and she may as well leave. Mother reports that Lorazepam seemed to help some. Family reports that pt was more irritable when mother was away for several days and not giving Ativan to her. Mother reports that pt's anxiety seems to manifest as frustration and irritation. Pt reports that she has been feeling more depressed. Pt reports that she has been feeling bored. Mother reports that pt was maintaining home when mother was away.   Mother reports that pt is not sleeping well. Denies difficulty falling asleep. Will then wake up and go sleep on the couch in the living room. Sleep is fragmented.  Mother reports that pt has been eating frequently and gaining weight. Pt feels like she cannot get enough to eat and is craving sweets. They report that increased food intake has occurred for the last couple of months. Denies any impulsive or risky behavior. Seems to think that family may be against her and this seems to be worse.   Denies SI.   Mother reports that pt has increased anxiety with wearing a face mask.  Past medication trials: Abilify-helpful for mood signs and symptoms. Had  tremor  and involuntary orofacial movements. Possible weight gain. Vraylar- Had tremor and oral facial movements Latuda-ineffective for mood signs and symptoms. No tolerability issues. Fanapt Sertraline-has taken long-term. May have been helpful for depression. Lamictal-prescribed for seizures by neurologist. Patient has taken long-term and effect on mood is unclear. Topamax-taken long-term and was started for seizures. Depakote-prescribed for seizures in the past. Lithium- tremor, hot flashes, worsening mood.   Review of Systems:  Review of Systems  Cardiovascular: Positive for leg swelling.  Gastrointestinal: Positive for abdominal pain.  Musculoskeletal: Negative for gait problem.  Skin:       Reports that skin sores and itching improved and now seems to be recurring.   Neurological: Negative for tremors.  Psychiatric/Behavioral:       Please refer to HPI    Medications: I have reviewed the patient's current medications.  Current Outpatient Medications  Medication Sig Dispense Refill  . acetaminophen (TYLENOL) 325 MG tablet Take 650 mg by mouth every 6 (six) hours as needed.    . Brexpiprazole (REXULTI) 0.5 MG TABS Take 1 tablet (0.5 mg total) by mouth daily. 30 tablet 0  . escitalopram (LEXAPRO) 10 MG tablet Take 1 tablet (10 mg total) by mouth daily. 90 tablet 0  . gabapentin (NEURONTIN) 300 MG capsule TAKE 1 CAPSULE BY MOUTH EVERYDAY AT BEDTIME 90 capsule 1  . lamoTRIgine (LAMICTAL) 150 MG tablet Take 1 tablet (150 mg total) by mouth 2 (two) times daily. 180 tablet 3  . levothyroxine (SYNTHROID, LEVOTHROID) 137 MCG tablet Take 1 tablet (137 mcg total) by mouth daily before breakfast. 60 tablet 3  . LORazepam (ATIVAN) 0.5 MG tablet Take 1/2-1 tab po q 6 hours prn 60 tablet 1  . omeprazole (PRILOSEC) 40 MG capsule TAKE 1 CAPSULE (40 MG TOTAL) BY MOUTH DAILY. 90 capsule 0  . topiramate (TOPAMAX) 100 MG tablet One tablet in the morning and 1.5 tablets at night 225 tablet 3   No  current facility-administered medications for this visit.     Medication Side Effects: Other: Increased appetite/Wt. gain  Allergies:  Allergies  Allergen Reactions  . Penicillins Rash    Has patient had a PCN reaction causing immediate rash, facial/tongue/throat swelling, SOB or lightheadedness with hypotension: Yes Has patient had a PCN reaction causing severe rash involving mucus membranes or skin necrosis: No Has patient had a PCN reaction that required hospitalization: No Has patient had a PCN reaction occurring within the last 10 years: Yes If all of the above answers are "NO", then may proceed with Cephalosporin use.  3g Ancef administered 10/21/2017 without reaction/complic    Past Medical History:  Diagnosis Date  . Anemia 2016  . Anxiety   . Bipolar disorder ([place])   . Cancer ([place])   . Complication of anesthesia   . Depression   . Epigastric hernia   . Esophagitis    LA Class A  . GERD (gastroesophageal reflux disease)   . Hiatal hernia   . HOH (hard of hearing)   . Hypothyroidism   . Learning disability   . Obesity   . PONV (postoperative nausea and vomiting)   . Pseudoseizures   . Seizure ([place]) 2014   last seizure 2(two) years ago  . Seizures ([place])    according to echart- seizures vs pseudoseizures  . Sleep apnea    No cpap  . Thyroid disease   . Ventral hernia     Family History  Problem Relation Age of Onset  . Colon polyps Mother   .  Celiac disease Mother   . Depression Mother   . Anxiety disorder Mother   . Colon polyps Father   . Celiac disease Brother   . Diabetes Sister   . Depression Sister   . Depression Brother   . CVA Brother   . Diabetes Paternal Grandmother   . Heart disease Paternal Grandfather   . Colon polyps Paternal Aunt   . Diabetes Maternal Aunt   . Heart disease Maternal Grandfather   . Heart disease Maternal Uncle   . Heart disease Maternal Aunt   . Seizures Neg Hx     Social History   Socioeconomic History  .  Marital status: Single    Spouse name: Not on file  . Number of children: 0  . Years of education: 10  . Highest education level: Not on file  Occupational History  . Not on file  Social Needs  . Financial resource strain: Not on file  . Food insecurity    Worry: Not on file    Inability: Not on file  . Transportation needs    Medical: Not on file    Non-medical: Not on file  Tobacco Use  . Smoking status: Never Smoker  . Smokeless tobacco: Never Used  Substance and Sexual Activity  . Alcohol use: No    Alcohol/week: 0.0 standard drinks  . Drug use: No  . Sexual activity: Not Currently  Lifestyle  . Physical activity    Days per week: Not on file    Minutes per session: Not on file  . Stress: Not on file  Relationships  . Social Herbalist on phone: Not on file    Gets together: Not on file    Attends religious service: Not on file    Active member of club or organization: Not on file    Attends meetings of clubs or organizations: Not on file    Relationship status: Not on file  . Intimate partner violence    Fear of current or ex partner: Not on file    Emotionally abused: Not on file    Physically abused: Not on file    Forced sexual activity: Not on file  Other Topics Concern  . Not on file  Social History Narrative   Patient lives at home with mom.    Patient does not have any children.    Patient has a 10th grade education.    Patient is single.    Patient is left handed.    Does not have a living will or HPOA- full code.    Past Medical History, Surgical history, Social history, and Family history were reviewed and updated as appropriate.   Please see review of systems for further details on the patient's review from today.   Objective:   Physical Exam:  Wt 295 lb (133.8 kg)   LMP 12/19/2014 (Exact Date)   BMI 47.61 kg/m     Lab Review:     Component Value Date/Time   NA 139 10/13/2017 0901   K 3.8 10/13/2017 0901   CL 110  10/13/2017 0901   CO2 20 (L) 10/13/2017 0901   GLUCOSE 133 (H) 10/13/2017 0901   BUN 10 10/13/2017 0901   CREATININE 0.98 10/13/2017 0901   CALCIUM 8.5 (L) 10/13/2017 0901   PROT 6.4 07/29/2017 1220   ALBUMIN 3.9 07/29/2017 1220   AST 14 07/29/2017 1220   ALT 16 07/29/2017 1220   ALKPHOS 76 07/29/2017 1220   BILITOT 0.4  07/29/2017 1220   GFRNONAA >60 10/13/2017 0901   GFRAA >60 10/13/2017 0901       Component Value Date/Time   WBC 6.0 10/13/2017 0901   RBC 3.89 10/13/2017 0901   HGB 11.6 (L) 10/13/2017 0901   HGB 12.3 11/03/2014 1100   HCT 37.6 10/13/2017 0901   HCT 35.7 11/03/2014 1100   PLT 176 10/13/2017 0901   MCV 96.7 10/13/2017 0901   MCH 29.8 10/13/2017 0901   MCHC 30.9 10/13/2017 0901   RDW 14.3 10/13/2017 0901   LYMPHSABS 1.4 12/19/2016 1400   MONOABS 0.3 12/19/2016 1400   EOSABS 0.5 12/19/2016 1400   BASOSABS 0.0 12/19/2016 1400    No results found for: POCLITH, LITHIUM   No results found for: PHENYTOIN, PHENOBARB, VALPROATE, CBMZ   .res Assessment: Plan:     Patient advised to contact office with any questions, adverse effects, or acute worsening in signs and symptoms.  Terrah was seen today for medication problem, anxiety and paranoid.  Diagnoses and all orders for this visit:  Bipolar depression ([place]) -     Brexpiprazole (REXULTI) 0.5 MG TABS; Take 1 tablet (0.5 mg total) by mouth daily. -     escitalopram (LEXAPRO) 10 MG tablet; Take 1 tablet (10 mg total) by mouth daily.  Other specified anxiety disorders -     escitalopram (LEXAPRO) 10 MG tablet; Take 1 tablet (10 mg total) by mouth daily. -     LORazepam (ATIVAN) 0.5 MG tablet; Take 1/2-1 tab po q 6 hours prn    Please see After Visit Summary for patient specific instructions.  Future Appointments  Date Time Provider [place]  11/26/2018  9:00 AM Thayer Headings, PMHNP [facility] None  12/21/2018 10:30 AM Ward Givens, NP [facility] None  01/27/2019 11:00 AM Dennis Bast, RN  [facility]  02/23/2019 10:00 AM Philemon Kingdom, MD [facility] None    No orders of the defined types were placed in this encounter.     -------------------------------  Past Medical History, Surgical history, Social history, and Family history were reviewed and updated as appropriate.   Please see review of systems for further details on the patient's review from today.   Objective:   Physical Exam:  Wt 295 lb (133.8 kg)   LMP 12/19/2014 (Exact Date)   BMI 47.61 kg/m   Physical Exam Constitutional:      General: She is not in acute distress.    Appearance: She is well-developed.  Musculoskeletal:        General: No deformity.  Neurological:     Mental Status: She is alert and oriented to person, place, and time.     Coordination: Coordination normal.  Psychiatric:        Attention and Perception: She does not perceive auditory or visual hallucinations.        Mood and Affect: Mood is anxious. Mood is not depressed. Affect is not labile, blunt, angry or inappropriate.        Speech: Speech normal.        Behavior: Behavior is cooperative.        Thought Content: Thought content is paranoid. Thought content does not include homicidal or suicidal ideation. Thought content does not include homicidal or suicidal plan.        Cognition and Memory: Memory normal. Cognition is impaired.        Judgment: Judgment is inappropriate.     Comments: Patient more engaged compared to some previous exams.     Lab  Review:     Component Value Date/Time   NA 139 10/13/2017 0901   K 3.8 10/13/2017 0901   CL 110 10/13/2017 0901   CO2 20 (L) 10/13/2017 0901   GLUCOSE 133 (H) 10/13/2017 0901   BUN 10 10/13/2017 0901   CREATININE 0.98 10/13/2017 0901   CALCIUM 8.5 (L) 10/13/2017 0901   PROT 6.4 07/29/2017 1220   ALBUMIN 3.9 07/29/2017 1220   AST 14 07/29/2017 1220   ALT 16 07/29/2017 1220   ALKPHOS 76 07/29/2017 1220   BILITOT 0.4 07/29/2017 1220   GFRNONAA >60 10/13/2017 0901    GFRAA >60 10/13/2017 0901       Component Value Date/Time   WBC 6.0 10/13/2017 0901   RBC 3.89 10/13/2017 0901   HGB 11.6 (L) 10/13/2017 0901   HGB 12.3 11/03/2014 1100   HCT 37.6 10/13/2017 0901   HCT 35.7 11/03/2014 1100   PLT 176 10/13/2017 0901   MCV 96.7 10/13/2017 0901   MCH 29.8 10/13/2017 0901   MCHC 30.9 10/13/2017 0901   RDW 14.3 10/13/2017 0901   LYMPHSABS 1.4 12/19/2016 1400   MONOABS 0.3 12/19/2016 1400   EOSABS 0.5 12/19/2016 1400   BASOSABS 0.0 12/19/2016 1400    No results found for: POCLITH, LITHIUM   No results found for: PHENYTOIN, PHENOBARB, VALPROATE, CBMZ   .res Assessment: Plan:   Discussed several treatment options and their potential benefits, risks, and side effects to include Rexulti, loxapine, and Saphris.  Discussed that pharmacologically Rexulti is most similar to Abilify.  Patient and her mother would like to start trial of Rexulti since Abilify has been the most effective medication for her signs and symptoms.  Will start with low dose to determine tolerability and will consider further titration if needed.  Advised patient's mother to wait and start Rexulti after patient has not had any involuntary movements for at least 3 days since they report that involuntary movements have improved since stopping Abilify and do not recall her having any involuntary movements yesterday.  Advised patient and her mother to contact office if patient does experience any involuntary movements with Rexulti.   Will start Rexulti 0.5 mg p.o. daily. We will continue Lexapro for mood and anxiety. Continue Ativan as needed for anxiety. Patient to follow-up in 4 weeks or sooner if clinically indicated. Patient advised to contact office with any questions, adverse effects, or acute worsening in signs and symptoms.  Lamija was seen today for medication problem, anxiety and paranoid.  Diagnoses and all orders for this visit:  Bipolar depression ([place]) -      Brexpiprazole (REXULTI) 0.5 MG TABS; Take 1 tablet (0.5 mg total) by mouth daily. -     escitalopram (LEXAPRO) 10 MG tablet; Take 1 tablet (10 mg total) by mouth daily.  Other specified anxiety disorders -     escitalopram (LEXAPRO) 10 MG tablet; Take 1 tablet (10 mg total) by mouth daily. -     LORazepam (ATIVAN) 0.5 MG tablet; Take 1/2-1 tab po q 6 hours prn     Please see After Visit Summary for patient specific instructions.  Future Appointments  Date Time Provider Branch  11/26/2018  9:00 AM Thayer Headings, PMHNP CP-CP None  12/21/2018 10:30 AM Ward Givens, NP [facility] None  01/27/2019 11:00 AM Dennis Bast, RN [facility]  02/23/2019 10:00 AM Philemon Kingdom, MD [facility] None    No orders of the defined types were placed in this encounter.   -------------------------------

## 2018-10-30 ENCOUNTER — Encounter: Payer: Self-pay | Admitting: Psychiatry

## 2018-11-06 ENCOUNTER — Telehealth: Payer: Self-pay

## 2018-11-06 NOTE — Telephone Encounter (Signed)
Questions for Screening COVID-19  Symptom onset: None  Travel or Contacts: None  During this illness, did/does the patient experience any of the following symptoms? Fever >100.10F []   Yes [x]   No []   Unknown Subjective fever (felt feverish) []   Yes [x]   No []   Unknown Chills []   Yes [x]   No []   Unknown Muscle aches (myalgia) []   Yes [x]   No []   Unknown Runny nose (rhinorrhea) []   Yes [x]   No []   Unknown Sore throat []   Yes [x]   No []   Unknown Cough (new onset or worsening of chronic cough) []   Yes [x]   No []   Unknown Shortness of breath (dyspnea) []   Yes [x]   No []   Unknown Nausea or vomiting []   Yes [x]   No []   Unknown Headache []   Yes [x]   No []   Unknown Abdominal pain  []   Yes [x]   No []   Unknown Diarrhea (?3 loose/looser than normal stools/24hr period) []   Yes [x]   No []   Unknown Other, specify:  Patient risk factors: Smoker? []   Current []   Former []   Never If female, currently pregnant? []   Yes []   No  Patient Active Problem List   Diagnosis Date Noted  . Impetigo bullosa 10/01/2018  . Neurodermatitis 10/01/2018  . RUQ pain 09/17/2018  . Postprandial RUQ pain 09/17/2018  . Right wrist pain 07/09/2018  . Rash and nonspecific skin eruption 06/09/2018  . Left hand pain 03/30/2018  . Tremor 03/30/2018  . Severe manic bipolar I disorder with psychotic features ([place]) 01/20/2018  . Atypical mole 12/29/2017  . Constipation 03/04/2017  . Hot flashes 02/24/2017  . Sciatic leg pain 07/10/2016  . Morbid obesity ([place]) 04/05/2014  . Hyperglycemia 09/21/2013  . Dyslipidemia 09/14/2013  . 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 Hypothyroidism 05/30/2011  . Depression, major, recurrent, severe with psychosis ([place]) 05/30/2011  . MR (mental retardation) 05/30/2011  . Bipolar disorder ([place]) 05/30/2011  . Sleep apnea 05/30/2011  . Seizures ([place])     Plan:  []   High risk for COVID-19 with red flags go to ED (with CP, SOB, weak/lightheaded,  or fever > 101.5). Call ahead.  []   High risk for COVID-19 but stable. Inform provider and coordinate time for [facility] visit.   []   No red flags but URI signs or symptoms okay for [facility] visit.

## 2018-11-09 ENCOUNTER — Ambulatory Visit (INDEPENDENT_AMBULATORY_CARE_PROVIDER_SITE_OTHER): Payer: Medicare Other | Admitting: Family Medicine

## 2018-11-09 ENCOUNTER — Encounter: Payer: Self-pay | Admitting: Family Medicine

## 2018-11-09 VITALS — BP 118/90 | HR 85 | Temp 98.5°F | Ht 66.0 in | Wt 297.8 lb

## 2018-11-09 DIAGNOSIS — R600 Localized edema: Secondary | ICD-10-CM

## 2018-11-09 DIAGNOSIS — R739 Hyperglycemia, unspecified: Secondary | ICD-10-CM | POA: Diagnosis not present

## 2018-11-09 DIAGNOSIS — F3170 Bipolar disorder, currently in remission, most recent episode unspecified: Secondary | ICD-10-CM

## 2018-11-09 LAB — COMPREHENSIVE METABOLIC PANEL
ALT: 14 U/L (ref 0–35)
AST: 13 U/L (ref 0–37)
Albumin: 4 g/dL (ref 3.5–5.2)
Alkaline Phosphatase: 74 U/L (ref 39–117)
BUN: 9 mg/dL (ref 6–23)
CO2: 23 mEq/L (ref 19–32)
Calcium: 8.7 mg/dL (ref 8.4–10.5)
Chloride: 106 mEq/L (ref 96–112)
Creatinine, Ser: 0.89 mg/dL (ref 0.40–1.20)
GFR: 68.26 mL/min (ref >60.00)
Glucose, Bld: 104 mg/dL — ABNORMAL HIGH (ref 70–99)
Potassium: 4 mEq/L (ref 3.5–5.1)
Sodium: 138 mEq/L (ref 135–145)
Total Bilirubin: 0.6 mg/dL (ref 0.2–1.2)
Total Protein: 6.6 g/dL (ref 6.0–8.3)

## 2018-11-09 LAB — CBC WITH DIFFERENTIAL/PLATELET
Basophils Absolute: 0.1 10*3/uL (ref 0.0–0.1)
Basophils Relative: 1.5 % (ref 0.0–3.0)
Eosinophils Absolute: 0.3 10*3/uL (ref 0.0–0.7)
Eosinophils Relative: 4.3 % (ref 0.0–5.0)
HCT: 34.9 % — ABNORMAL LOW (ref 36.0–46.0)
Hemoglobin: 11.2 g/dL — ABNORMAL LOW (ref 12.0–15.0)
Lymphocytes Relative: 22.7 % (ref 12.0–46.0)
Lymphs Abs: 1.4 10*3/uL (ref 0.7–4.0)
MCHC: 32.2 g/dL (ref 30.0–36.0)
MCV: 90.4 fl (ref 78.0–100.0)
Monocytes Absolute: 0.3 10*3/uL (ref 0.1–1.0)
Monocytes Relative: 5.3 % (ref 3.0–12.0)
Neutro Abs: 4 10*3/uL (ref 1.4–7.7)
Neutrophils Relative %: 66.2 % (ref 43.0–77.0)
Platelets: 185 10*3/uL (ref 150.0–400.0)
RBC: 3.86 Mil/uL — ABNORMAL LOW (ref 3.87–5.11)
RDW: 15.7 % — ABNORMAL HIGH (ref 11.5–15.5)
WBC: 6.1 10*3/uL (ref 4.0–10.5)

## 2018-11-09 LAB — TSH: TSH: 4.79 u[IU]/mL — ABNORMAL HIGH (ref 0.35–4.50)

## 2018-11-09 LAB — BRAIN NATRIURETIC PEPTIDE: Pro B Natriuretic peptide (BNP): 65 pg/mL (ref 0.0–100.0)

## 2018-11-09 LAB — HEMOGLOBIN A1C: Hgb A1c MFr Bld: 6.1 % (ref 4.6–6.5)

## 2018-11-09 LAB — T4, FREE: Free T4: 1.04 ng/dL (ref 0.60–1.60)

## 2018-11-09 NOTE — Assessment & Plan Note (Signed)
New- progressive. >25 minutes spent in face to face time with patient, >50% spent in counselling or coordination of care discussed edema with pt and her mother. Rule out some potential contributing factors with labs today.  Also advised going to medical supply store to get compression hose.  STOP drinking sodas or anything else with hidden sodium, which we talked about today.  Advised to keep legs elevated and to get up and move more. The patient indicates understanding of these issues and agrees with the plan.  Orders Placed This Encounter  Procedures  . Comp Met (CMET)  . CBC w/Diff  . HgB A1c  . B Nat Peptide  . TSH  . T4, free

## 2018-11-09 NOTE — Progress Notes (Signed)
Subjective:   Patient ID: Nancy Hall, female    DOB: December 04, 1970, 48 y.o.   MRN: [id number]  Nancy Hall is a pleasant 48 y.o. year old female who presents to clinic today with Leg Swelling (Pt and mother screened at vehicle. Pt is here today C/O BLE Edema. This swelling has been going on for about 26-month-now.  She states that she is also having a lot of pain in both legs and feet but worse in the left. When she crosses her legs it is very painful. There are bulging veins. )  on 11/09/2018  HPI:  Here with her mother today. Bilateral LE edema- progressing for 3 months.  Just recently became painful.  No erythema. No chest pain, No SOB.    She has been more thirsty.  Has not found compression hose at the pharmacy that fit.  She does try to keep her legs elevated when seated.  She is not very active, especially now during the pandemic which is when this edema first started.  It is better when she wakes up in the morning.  Current Outpatient Medications on File Prior to Visit  Medication Sig Dispense Refill  . acetaminophen (TYLENOL) 325 MG tablet Take 650 mg by mouth every 6 (six) hours as needed.    . Brexpiprazole (REXULTI) 0.5 MG TABS Take 1 tablet by mouth daily.     .Marland Kitchenescitalopram (LEXAPRO) 10 MG tablet Take 1 tablet (10 mg total) by mouth daily. 90 tablet 0  . gabapentin (NEURONTIN) 300 MG capsule TAKE 1 CAPSULE BY MOUTH EVERYDAY AT BEDTIME 90 capsule 1  . lamoTRIgine (LAMICTAL) 150 MG tablet Take 1 tablet (150 mg total) by mouth 2 (two) times daily. 180 tablet 3  . levothyroxine (SYNTHROID, LEVOTHROID) 137 MCG tablet Take 1 tablet (137 mcg total) by mouth daily before breakfast. 60 tablet 3  . LORazepam (ATIVAN) 0.5 MG tablet Take 1/2-1 tab po q 6 hours prn 60 tablet 1  . omeprazole (PRILOSEC) 40 MG capsule TAKE 1 CAPSULE (40 MG TOTAL) BY MOUTH DAILY. 90 capsule 0  . topiramate (TOPAMAX) 100 MG tablet One tablet in the morning and 1.5 tablets at night 225 tablet 3   No  current facility-administered medications on file prior to visit.     Allergies  Allergen Reactions  . Penicillins Rash    Has patient had a PCN reaction causing immediate rash, facial/tongue/throat swelling, SOB or lightheadedness with hypotension: Yes Has patient had a PCN reaction causing severe rash involving mucus membranes or skin necrosis: No Has patient had a PCN reaction that required hospitalization: No Has patient had a PCN reaction occurring within the last 10 years: Yes If all of the above answers are "NO", then may proceed with Cephalosporin use.  3g Ancef administered 10/21/2017 without reaction/complic    Past Medical History:  Diagnosis Date  . Anemia 2016  . Anxiety   . Bipolar disorder ([place]   . Cancer ([place]   . Complication of anesthesia   . Depression   . Epigastric hernia   . Esophagitis    LA Class A  . GERD (gastroesophageal reflux disease)   . Hiatal hernia   . HOH (hard of hearing)   . Hypothyroidism   . Learning disability   . Obesity   . PONV (postoperative nausea and vomiting)   . Pseudoseizures   . Seizure ([place] 2014   last seizure 2(two) years ago  . [place] ([place]    according to echart- seizures  vs pseudoseizures  . Sleep apnea    No cpap  . Thyroid disease   . Ventral hernia     Past Surgical History:  Procedure Laterality Date  . ABDOMINAL HYSTERECTOMY N/A 01/09/2015   Procedure: HYSTERECTOMY ABDOMINAL/BILATERAL SALPINGECTOMY;  Surgeon: Rubie Maid, MD;  Location: [facility];  Service: Gynecology;  Laterality: N/A;  . DILATION AND CURETTAGE OF UTERUS    . HYSTEROSCOPY W/D&C N/A 12/19/2014   Procedure: DILATATION AND CURETTAGE /HYSTEROSCOPY;  Surgeon: Rubie Maid, MD;  Location: [facility];  Service: Gynecology;  Laterality: N/A;  . INNER EAR SURGERY Bilateral 1994   poor historian  . INSERTION OF MESH N/A 10/21/2017   Procedure: INSERTION OF MESH;  Surgeon: Donnie Mesa, MD;  Location: [facility];  Service: General;  Laterality: N/A;   . TONSILLECTOMY    . VENTRAL HERNIA REPAIR N/A 10/21/2017   Procedure: OPEN REPAIR EPIGASTIC VENTRAL HERNIA WITH  MESH ERAS PATHWAY;  Surgeon: Donnie Mesa, MD;  Location: [facility];  Service: General;  Laterality: N/A;    Family History  Problem Relation Age of Onset  . Colon polyps Mother   . Celiac disease Mother   . Depression Mother   . Anxiety disorder Mother   . Colon polyps Father   . Celiac disease Brother   . Diabetes Sister   . Depression Sister   . Depression Brother   . CVA Brother   . Diabetes Paternal Grandmother   . Heart disease Paternal Grandfather   . Colon polyps Paternal Aunt   . Diabetes Maternal Aunt   . Heart disease Maternal Grandfather   . Heart disease Maternal Uncle   . Heart disease Maternal Aunt   . Seizures Neg Hx     Social History   Socioeconomic History  . Marital status: Single    Spouse name: Not on file  . Number of children: 0  . Years of education: 10  . Highest education level: Not on file  Occupational History  . Not on file  Social Needs  . Financial resource strain: Not on file  . Food insecurity    Worry: Not on file    Inability: Not on file  . Transportation needs    Medical: Not on file    Non-medical: Not on file  Tobacco Use  . Smoking status: Never Smoker  . Smokeless tobacco: Never Used  Substance and Sexual Activity  . Alcohol use: No    Alcohol/week: 0.0 standard drinks  . Drug use: No  . Sexual activity: Not Currently  Lifestyle  . Physical activity    Days per week: Not on file    Minutes per session: Not on file  . Stress: Not on file  Relationships  . Social Herbalist on phone: Not on file    Gets together: Not on file    Attends religious service: Not on file    Active member of club or organization: Not on file    Attends meetings of clubs or organizations: Not on file    Relationship status: Not on file  . Intimate partner violence    Fear of current or ex partner: Not on file     Emotionally abused: Not on file    Physically abused: Not on file    Forced sexual activity: Not on file  Other Topics Concern  . Not on file  Social History Narrative   Patient lives at home with mom.    Patient does not have any  children.    Patient has a 10th grade education.    Patient is single.    Patient is left handed.    Does not have a living will or HPOA- full code.   The PMH, PSH, Social History, Family History, Medications, and allergies have been reviewed in [facility], and have been updated if relevant.   Review of Systems  Respiratory: Negative for shortness of breath.   Cardiovascular: Positive for leg swelling. Negative for chest pain and palpitations.  Gastrointestinal: Negative.   Endocrine: Positive for polyuria.  Genitourinary: Negative.   Musculoskeletal: Negative.   Allergic/Immunologic: Negative.   Neurological: Negative.   Hematological: Negative.   Psychiatric/Behavioral: Negative.   All other systems reviewed and are negative.      Objective:    BP 118/90 (BP Location: Left Arm, Patient Position: Sitting, Cuff Size: Large)   Pulse 85   Temp 98.5 F (36.9 C) (Oral)   Ht '5\' 6"'  (1.676 m)   Wt 297 lb 12.8 oz (135.1 kg)   LMP 12/19/2014 (Exact Date)   SpO2 96%   BMI 48.07 kg/m  Wt Readings from Last 3 Encounters:  11/09/18 297 lb 12.8 oz (135.1 kg)  10/01/18 (!) 301 lb (136.5 kg)  07/09/18 288 lb 12.8 oz (131 kg)     Physical Exam Vitals signs and nursing note reviewed.  Constitutional:      Appearance: She is obese.  HENT:     Head: Normocephalic and atraumatic.     Nose: Nose normal.     Mouth/Throat:     Mouth: Mucous membranes are moist.  Eyes:     Extraocular Movements: Extraocular movements intact.  Neck:     Musculoskeletal: Normal range of motion.  Cardiovascular:     Rate and Rhythm: Normal rate and regular rhythm.  Pulmonary:     Effort: Pulmonary effort is normal.     Breath sounds: Normal breath sounds.  Musculoskeletal:      Right lower leg: Edema present.     Left lower leg: Edema present.  Skin:    General: Skin is warm and dry.  Neurological:     General: No focal deficit present.     Mental Status: She is alert and oriented to person, place, and time.  Psychiatric:        Mood and Affect: Mood normal.        Behavior: Behavior normal.        Thought Content: Thought content normal.        Judgment: Judgment normal.           Assessment & Plan:   Bilateral edema of lower extremity - Plan: Comp Met (CMET), CBC w/Diff, B Nat Peptide, TSH,  Hyperglycemia - Plan: HgB A1c,   Bipolar disorder in partial remission, most recent episode unspecified type ([place]) - Plan: HgB A1c,  Bilateral leg edema - Plan:  No follow-ups on file.

## 2018-11-09 NOTE — Patient Instructions (Addendum)
Great to see you. I will call you with your lab results from today and you can view them online.   Please go to a medical supply store to get compression hose.  Cut back on salt/sodas.  Happy belated birthday!  Edema  Edema is when you have too much fluid in your body or under your skin. Edema may make your legs, feet, and ankles swell up. Swelling is also common in looser tissues, like around your eyes. This is a common condition. It gets more common as you get older. There are many possible causes of edema. Eating too much salt (sodium) and being on your feet or sitting for a long time can cause edema in your legs, feet, and ankles. Hot weather may make edema worse. Edema is usually painless. Your skin may look swollen or shiny. Follow these instructions at home:  Keep the swollen body part raised (elevated) above the level of your heart when you are sitting or lying down.  Do not sit still or stand for a long time.  Do not wear tight clothes. Do not wear garters on your upper legs.  Exercise your legs. This can help the swelling go down.  Wear elastic bandages or support stockings as told by your doctor.  Eat a low-salt (low-sodium) diet to reduce fluid as told by your doctor.  Depending on the cause of your swelling, you may need to limit how much fluid you drink (fluid restriction).  Take over-the-counter and prescription medicines only as told by your doctor. Contact a doctor if:  Treatment is not working.  You have heart, liver, or kidney disease and have symptoms of edema.  You have sudden and unexplained weight gain. Get help right away if:  You have shortness of breath or chest pain.  You cannot breathe when you lie down.  You have pain, redness, or warmth in the swollen areas.  You have heart, liver, or kidney disease and get edema all of a sudden.  You have a fever and your symptoms get worse all of a sudden. Summary  Edema is when you have too much fluid  in your body or under your skin.  Edema may make your legs, feet, and ankles swell up. Swelling is also common in looser tissues, like around your eyes.  Raise (elevate) the swollen body part above the level of your heart when you are sitting or lying down.  Follow your doctor's instructions about diet and how much fluid you can drink (fluid restriction). This information is not intended to replace advice given to you by your health care provider. Make sure you discuss any questions you have with your health care provider. Document Released: 09/04/2007 Document Revised: 03/21/2017 Document Reviewed: 04/05/2016 Elsevier Patient Education  2020 [employer].

## 2018-11-23 ENCOUNTER — Other Ambulatory Visit: Payer: Self-pay | Admitting: Family Medicine

## 2018-11-24 DIAGNOSIS — H6523 Chronic serous otitis media, bilateral: Secondary | ICD-10-CM | POA: Diagnosis not present

## 2018-11-26 ENCOUNTER — Encounter: Payer: Self-pay | Admitting: Psychiatry

## 2018-11-26 ENCOUNTER — Ambulatory Visit (INDEPENDENT_AMBULATORY_CARE_PROVIDER_SITE_OTHER): Payer: Medicare Other | Admitting: Psychiatry

## 2018-11-26 ENCOUNTER — Other Ambulatory Visit: Payer: Self-pay

## 2018-11-26 VITALS — Wt 297.0 lb

## 2018-11-26 DIAGNOSIS — F39 Unspecified mood [affective] disorder: Secondary | ICD-10-CM

## 2018-11-26 DIAGNOSIS — F319 Bipolar disorder, unspecified: Secondary | ICD-10-CM

## 2018-11-26 DIAGNOSIS — F418 Other specified anxiety disorders: Secondary | ICD-10-CM | POA: Diagnosis not present

## 2018-11-26 MED ORDER — ESCITALOPRAM OXALATE 10 MG PO TABS: 10.0000 mg | ORAL_TABLET | Freq: Every day | ORAL | 1 refills | Status: DC

## 2018-11-26 MED ORDER — REXULTI 0.5 MG PO TABS: 1.0000 | ORAL_TABLET | Freq: Every day | ORAL | 2 refills | Status: DC

## 2018-11-26 NOTE — Progress Notes (Signed)
   11/26/18 0926  Facial and Oral Movements  Muscles of Facial Expression 0  Lips and Perioral Area 0  Jaw 0  Tongue 0  Extremity Movements  Upper (arms, wrists, hands, fingers) 0  Lower (legs, knees, ankles, toes) 0  Trunk Movements  Neck, shoulders, hips 0  Overall Severity  Severity of abnormal movements (highest score from questions above) 0  Incapacitation due to abnormal movements 0  Patient's awareness of abnormal movements (rate only patient's report) 0  Dental Status  Current problems with teeth and/or dentures? No  Does patient usually wear dentures? Yes  AIMS Total Score  AIMS Total Score 0

## 2018-11-26 NOTE — Progress Notes (Signed)
[name] [id number] 02/27/1971 48 y.o.  Subjective:   Patient ID:  Nancy Hall is a 48 y.o. (DOB October 03, 1970) female.  Chief Complaint:  Chief Complaint  Patient presents with  . Follow-up    h/o depression, anxiety, and psychosis    HPI Nancy Hall presents to the office today for follow-up of depression and anxiety.   Mother reports, "she has been doing good... better than she's been in a long time." Mother reports that pt has been less anxious and has not needed prn medications as often. Continues to pick at skin and scalp.  Pt denies paranoia. Mother reports that pt is getting along well with everyone and is spending more time with everyone. Mother reports that pt is leaving the door open to her room. They deny any recent irritability or anger. They deny any depressed moods.Mother reports that pt wants to get out more and go shopping. Mother reports that masks cause pt anxiety so she does not want to wear it and has had some panic s/s in response to this. Pt awakens at 5-6 am and will doze back off and then eat breakfast. Typically sleeping about 9 hours a night. Energy has been ok. Mother reports that pt's motivation has improved and she is wanting to do more things and is also helping more. She reports enjoying some things. Denies SI.   Past medication trials: Abilify-helpful for mood signs and symptoms. Had tremor and involuntary orofacial movements. Possible weight gain. Vraylar- Had tremor and oral facial movements Latuda-ineffective for mood signs and symptoms. No tolerability issues. Fanapt Rexulti- Effective, well tolerated.  Sertraline-has taken long-term. May have been helpful for depression. Lamictal-prescribed for seizures by neurologist. Patient has taken long-term and effect on mood is unclear. Topamax-taken long-term and was started for seizures. Depakote-prescribed for seizures in the past. Lithium- tremor, hot flashes, worsening mood.  Review of  Systems:  Review of Systems  HENT: Positive for ear pain.        Started tx for an ear infection.   Cardiovascular: Positive for leg swelling.  Musculoskeletal: Negative for gait problem.  Skin:       Itching  Neurological: Positive for dizziness. Negative for tremors.  Psychiatric/Behavioral:       Please refer to HPI    Medications: I have reviewed the patient's current medications.  Current Outpatient Medications  Medication Sig Dispense Refill  . acetaminophen (TYLENOL) 325 MG tablet Take 650 mg by mouth every 6 (six) hours as needed.    . Brexpiprazole (REXULTI) 0.5 MG TABS Take 1 tablet (0.5 mg total) by mouth daily. 30 tablet 2  . escitalopram (LEXAPRO) 10 MG tablet Take 1 tablet (10 mg total) by mouth daily. 90 tablet 1  . gabapentin (NEURONTIN) 300 MG capsule Take 1 capsule by mouth once daily at bedtime 90 capsule 0  . lamoTRIgine (LAMICTAL) 150 MG tablet Take 1 tablet (150 mg total) by mouth 2 (two) times daily. 180 tablet 3  . levothyroxine (SYNTHROID, LEVOTHROID) 137 MCG tablet Take 1 tablet (137 mcg total) by mouth daily before breakfast. 60 tablet 3  . LORazepam (ATIVAN) 0.5 MG tablet Take 1/2-1 tab po q 6 hours prn 60 tablet 1  . omeprazole (PRILOSEC) 40 MG capsule TAKE 1 CAPSULE (40 MG TOTAL) BY MOUTH DAILY. 90 capsule 0  . topiramate (TOPAMAX) 100 MG tablet One tablet in the morning and 1.5 tablets at night 225 tablet 3   No current facility-administered medications for this visit.  Medication Side Effects: None  Allergies:  Allergies  Allergen Reactions  . Penicillins Rash    Has patient had a PCN reaction causing immediate rash, facial/tongue/throat swelling, SOB or lightheadedness with hypotension: Yes Has patient had a PCN reaction causing severe rash involving mucus membranes or skin necrosis: No Has patient had a PCN reaction that required hospitalization: No Has patient had a PCN reaction occurring within the last 10 years: Yes If all of the above  answers are "NO", then may proceed with Cephalosporin use.  3g Ancef administered 10/21/2017 without reaction/complic    Past Medical History:  Diagnosis Date  . Anemia 2016  . Anxiety   . Bipolar disorder ([place])   . Cancer ([place])   . Complication of anesthesia   . Depression   . Epigastric hernia   . Esophagitis    LA Class A  . GERD (gastroesophageal reflux disease)   . Hiatal hernia   . HOH (hard of hearing)   . Hypothyroidism   . Learning disability   . Obesity   . PONV (postoperative nausea and vomiting)   . Pseudoseizures   . Seizure ([place]) 2014   last seizure 2(two) years ago  . Seizures ([place])    according to echart- seizures vs pseudoseizures  . Sleep apnea    No cpap  . Thyroid disease   . Ventral hernia     Family History  Problem Relation Age of Onset  . Colon polyps Mother   . Celiac disease Mother   . Depression Mother   . Anxiety disorder Mother   . Colon polyps Father   . Celiac disease Brother   . Diabetes Sister   . Depression Sister   . Depression Brother   . CVA Brother   . Diabetes Paternal Grandmother   . Heart disease Paternal Grandfather   . Colon polyps Paternal Aunt   . Diabetes Maternal Aunt   . Heart disease Maternal Grandfather   . Heart disease Maternal Uncle   . Heart disease Maternal Aunt   . Seizures Neg Hx     Social History   Socioeconomic History  . Marital status: Single    Spouse name: Not on file  . Number of children: 0  . Years of education: 10  . Highest education level: Not on file  Occupational History  . Not on file  Social Needs  . Financial resource strain: Not on file  . Food insecurity    Worry: Not on file    Inability: Not on file  . Transportation needs    Medical: Not on file    Non-medical: Not on file  Tobacco Use  . Smoking status: Never Smoker  . Smokeless tobacco: Never Used  Substance and Sexual Activity  . Alcohol use: No    Alcohol/week: 0.0 standard drinks  . Drug use: No  .  Sexual activity: Not Currently  Lifestyle  . Physical activity    Days per week: Not on file    Minutes per session: Not on file  . Stress: Not on file  Relationships  . Social Herbalist on phone: Not on file    Gets together: Not on file    Attends religious service: Not on file    Active member of club or organization: Not on file    Attends meetings of clubs or organizations: Not on file    Relationship status: Not on file  . Intimate partner violence    Fear of  current or ex partner: Not on file    Emotionally abused: Not on file    Physically abused: Not on file    Forced sexual activity: Not on file  Other Topics Concern  . Not on file  Social History Narrative   Patient lives at home with mom.    Patient does not have any children.    Patient has a 10th grade education.    Patient is single.    Patient is left handed.    Does not have a living will or HPOA- full code.    Past Medical History, Surgical history, Social history, and Family history were reviewed and updated as appropriate.   Please see review of systems for further details on the patient's review from today.   Objective:   Physical Exam:  Wt 297 lb (134.7 kg)   LMP 12/19/2014 (Exact Date)   BMI 47.94 kg/m   Physical Exam Constitutional:      General: She is not in acute distress.    Appearance: She is well-developed.  Musculoskeletal:        General: No deformity.  Neurological:     Mental Status: She is alert and oriented to person, place, and time.     Coordination: Coordination normal.  Psychiatric:        Attention and Perception: Attention and perception normal. She does not perceive auditory or visual hallucinations.        Mood and Affect: Mood normal. Mood is not anxious or depressed. Affect is not labile, blunt, angry or inappropriate.        Speech: Speech normal.        Behavior: Behavior normal.        Thought Content: Thought content normal. Thought content is not  paranoid or delusional. Thought content does not include homicidal or suicidal ideation. Thought content does not include homicidal or suicidal plan.        Cognition and Memory: Memory normal.        Judgment: Judgment normal.     Comments: Insight fair     Lab Review:     Component Value Date/Time   NA 138 11/09/2018 1012   K 4.0 11/09/2018 1012   CL 106 11/09/2018 1012   CO2 23 11/09/2018 1012   GLUCOSE 104 (H) 11/09/2018 1012   BUN 9 11/09/2018 1012   CREATININE 0.89 11/09/2018 1012   CALCIUM 8.7 11/09/2018 1012   PROT 6.6 11/09/2018 1012   ALBUMIN 4.0 11/09/2018 1012   AST 13 11/09/2018 1012   ALT 14 11/09/2018 1012   ALKPHOS 74 11/09/2018 1012   BILITOT 0.6 11/09/2018 1012   GFRNONAA >60 10/13/2017 0901   GFRAA >60 10/13/2017 0901       Component Value Date/Time   WBC 6.1 11/09/2018 1012   RBC 3.86 (L) 11/09/2018 1012   HGB 11.2 (L) 11/09/2018 1012   HGB 12.3 11/03/2014 1100   HCT 34.9 (L) 11/09/2018 1012   HCT 35.7 11/03/2014 1100   PLT 185.0 11/09/2018 1012   MCV 90.4 11/09/2018 1012   MCH 29.8 10/13/2017 0901   MCHC 32.2 11/09/2018 1012   RDW 15.7 (H) 11/09/2018 1012   LYMPHSABS 1.4 11/09/2018 1012   MONOABS 0.3 11/09/2018 1012   EOSABS 0.3 11/09/2018 1012   BASOSABS 0.1 11/09/2018 1012    No results found for: POCLITH, LITHIUM   No results found for: PHENYTOIN, PHENOBARB, VALPROATE, CBMZ   .res Assessment: Plan:   Continue Rexulti 0.5 mg po qd for  mood, anxiety, and psychosis since this has been effective for her s/s and well tolerated.Continue Lexapro 10 mg po qd. Continue Ativan prn anxiety. Pt to f/u in 2 months or sooner if clinically indicated. Patient advised to contact office with any questions, adverse effects, or acute worsening in signs and symptoms.  Nancy Hall was seen today for follow-up.  Diagnoses and all orders for this visit:  Episodic mood disorder (H[facility]) -     Brexpiprazole (REXULTI) 0.5 MG TABS; Take 1 tablet (0.5 mg total) by  mouth daily.  Bipolar depression ([place]) -     escitalopram (LEXAPRO) 10 MG tablet; Take 1 tablet (10 mg total) by mouth daily.  Other specified anxiety disorders -     escitalopram (LEXAPRO) 10 MG tablet; Take 1 tablet (10 mg total) by mouth daily.     Please see After Visit Summary for patient specific instructions.  Future Appointments  Date Time Provider [place]  12/21/2018 10:30 AM Ward Givens, NP [facility] None  01/27/2019 11:00 AM Dennis Bast, RN [facility]  02/23/2019 10:00 AM Philemon Kingdom, MD [facility] None  03/01/2019 10:00 AM Thayer Headings, PMHNP [facility] None    No orders of the defined types were placed in this encounter.   -------------------------------

## 2018-12-08 ENCOUNTER — Telehealth: Payer: Self-pay

## 2018-12-08 NOTE — Telephone Encounter (Signed)
Copied from [place] [id number]. Topic: General - Inquiry >> Dec 08, 2018  9:06 AM Scherrie Gerlach wrote: Reason for CRM: mom calling for the pt to let Dr Deborra Medina know the same break out/rash that was on her legs the last time is now on her stomach. She gets sores that ooze. Pt's legs got better with what Dr Deborra Medina prescribed, and she does not want this to get any worse. Mom hopes Dr Deborra Medina will call in the last medicine she prescribed for the rash into  [place], [place]. [phone] (Phone) [phone] (Fax)

## 2018-12-14 ENCOUNTER — Other Ambulatory Visit: Payer: Self-pay | Admitting: Neurology

## 2018-12-19 ENCOUNTER — Other Ambulatory Visit: Payer: Self-pay | Admitting: Internal Medicine

## 2018-12-21 ENCOUNTER — Other Ambulatory Visit: Payer: Self-pay

## 2018-12-21 ENCOUNTER — Ambulatory Visit (INDEPENDENT_AMBULATORY_CARE_PROVIDER_SITE_OTHER): Payer: Medicare Other | Admitting: Adult Health

## 2018-12-21 ENCOUNTER — Encounter: Payer: Self-pay | Admitting: Adult Health

## 2018-12-21 VITALS — BP 124/80 | HR 76 | Temp 97.5°F | Ht 66.0 in | Wt 295.0 lb

## 2018-12-21 DIAGNOSIS — G40309 Generalized idiopathic epilepsy and epileptic syndromes, not intractable, without status epilepticus: Secondary | ICD-10-CM

## 2018-12-21 NOTE — Progress Notes (Signed)
I have read the note, and I agree with the clinical assessment and plan.  Charles K Willis   

## 2018-12-21 NOTE — Progress Notes (Addendum)
PATIENT: Nancy Hall DOB: [birth year]  REASON FOR VISIT: follow up HISTORY FROM: patient  HISTORY OF PRESENT ILLNESS: Today 12/21/18:  Nancy Hall is a 48 year old female with a history of seizures.  She returns today for follow-up.  She remains on gabapentin, Topamax and Lamictal.  She denies any seizures.  Her mother is with her today reports that is been approximately 6 years since her last seizure.  She lives with her mother.  She is able to complete all ADLs independently.  She does not operate a motor vehicle.  Denies any changes with her gait or balance.  Her mother reports that she did have a fall approximately 1 week ago.  Reports this is because she tripped over an object.  Her mother also reports that she has been having some discomfort in the lower extremities.  She was given compression stockings by her PCP but they do not feel that those have been beneficial.  They plan to follow-up with PCP.  HISTORY Nancy Hall is a 48 year old left-handed white female with a history of obesity and a history of bipolar disorder.  The patient is followed through psychiatry, she had been on Abilify until about 1 month ago.  Apparently, she developed tremors involving both upper extremities and a jaw tremor, she was taken off of Abilify 1 month ago, and the tremors have essentially resolved.  The patient had no alteration in voice or gait on the medication.  She is now on [place], she seems to tolerate this better.  The patient is on Topamax, gabapentin, and lamotrigine.  The patient tolerates these medications well, she has not had a seizure type event in over 5 years.  The patient indicates that she sleeps fairly well at night usually.  She has had blood work done within the last 4 months or so.  The patient returns for an evaluation.  REVIEW OF SYSTEMS: Out of a complete 14 system review of symptoms, the patient complains only of the following symptoms, and all other reviewed systems are negative.  See  HPI  ALLERGIES: Allergies  Allergen Reactions  . Penicillins Rash    Has patient had a PCN reaction causing immediate rash, facial/tongue/throat swelling, SOB or lightheadedness with hypotension: Yes Has patient had a PCN reaction causing severe rash involving mucus membranes or skin necrosis: No Has patient had a PCN reaction that required hospitalization: No Has patient had a PCN reaction occurring within the last 10 years: Yes If all of the above answers are "NO", then may proceed with Cephalosporin use.  3g Ancef administered 10/21/2017 without reaction/complic    HOME MEDICATIONS: Outpatient Medications Prior to Visit  Medication Sig Dispense Refill  . acetaminophen (TYLENOL) 325 MG tablet Take 650 mg by mouth every 6 (six) hours as needed.    . Brexpiprazole (REXULTI) 0.5 MG TABS Take 1 tablet (0.5 mg total) by mouth daily. 30 tablet 2  . escitalopram (LEXAPRO) 10 MG tablet Take 1 tablet (10 mg total) by mouth daily. 90 tablet 1  . EUTHYROX 137 MCG tablet TAKE 1 TABLET BY MOUTH ONCE DAILY BEFORE BREAKFAST 60 tablet 0  . gabapentin (NEURONTIN) 300 MG capsule Take 1 capsule by mouth once daily at bedtime 90 capsule 0  . lamoTRIgine (LAMICTAL) 150 MG tablet Take 1 tablet by mouth twice daily 180 tablet 0  . LORazepam (ATIVAN) 0.5 MG tablet Take 1/2-1 tab po q 6 hours prn 60 tablet 1  . omeprazole (PRILOSEC) 40 MG capsule TAKE 1 CAPSULE (  40 MG TOTAL) BY MOUTH DAILY. 90 capsule 0  . topiramate (TOPAMAX) 100 MG tablet One tablet in the morning and 1.5 tablets at night 225 tablet 3   No facility-administered medications prior to visit.     PAST MEDICAL HISTORY: Past Medical History:  Diagnosis Date  . Anemia 2016  . Anxiety   . Bipolar disorder ([place])   . Cancer ([place])   . Complication of anesthesia   . Depression   . Epigastric hernia   . Esophagitis    LA Class A  . GERD (gastroesophageal reflux disease)   . Hiatal hernia   . HOH (hard of hearing)   . Hypothyroidism   .  Learning disability   . Obesity   . PONV (postoperative nausea and vomiting)   . Pseudoseizures   . Seizure ([place]) 2014   last seizure 2(two) years ago  . Seizures ([place])    according to echart- seizures vs pseudoseizures  . Sleep apnea    No cpap  . Thyroid disease   . Ventral hernia     PAST SURGICAL HISTORY: Past Surgical History:  Procedure Laterality Date  . ABDOMINAL HYSTERECTOMY N/A 01/09/2015   Procedure: HYSTERECTOMY ABDOMINAL/BILATERAL SALPINGECTOMY;  Surgeon: Rubie Maid, MD;  Location: [facility];  Service: Gynecology;  Laterality: N/A;  . DILATION AND CURETTAGE OF UTERUS    . HYSTEROSCOPY W/D&C N/A 12/19/2014   Procedure: DILATATION AND CURETTAGE /HYSTEROSCOPY;  Surgeon: Rubie Maid, MD;  Location: [facility];  Service: Gynecology;  Laterality: N/A;  . INNER EAR SURGERY Bilateral 1994   poor historian  . INSERTION OF MESH N/A 10/21/2017   Procedure: INSERTION OF MESH;  Surgeon: Donnie Mesa, MD;  Location: [facility];  Service: General;  Laterality: N/A;  . TONSILLECTOMY    . VENTRAL HERNIA REPAIR N/A 10/21/2017   Procedure: OPEN REPAIR EPIGASTIC VENTRAL HERNIA WITH  MESH ERAS PATHWAY;  Surgeon: Donnie Mesa, MD;  Location: [facility];  Service: General;  Laterality: N/A;    FAMILY HISTORY: Family History  Problem Relation Age of Onset  . Colon polyps Mother   . Celiac disease Mother   . Depression Mother   . Anxiety disorder Mother   . Colon polyps Father   . Celiac disease Brother   . Diabetes Sister   . Depression Sister   . Depression Brother   . CVA Brother   . Diabetes Paternal Grandmother   . Heart disease Paternal Grandfather   . Colon polyps Paternal Aunt   . Diabetes Maternal Aunt   . Heart disease Maternal Grandfather   . Heart disease Maternal Uncle   . Heart disease Maternal Aunt   . Seizures Neg Hx     SOCIAL HISTORY: Social History   Socioeconomic History  . Marital status: Single    Spouse name: Not on file  . Number of children: 0  . Years  of education: 10  . Highest education level: Not on file  Occupational History  . Not on file  Social Needs  . Financial resource strain: Not on file  . Food insecurity    Worry: Not on file    Inability: Not on file  . Transportation needs    Medical: Not on file    Non-medical: Not on file  Tobacco Use  . Smoking status: Never Smoker  . Smokeless tobacco: Never Used  Substance and Sexual Activity  . Alcohol use: No    Alcohol/week: 0.0 standard drinks  . Drug use: No  . Sexual activity:  Not Currently  Lifestyle  . Physical activity    Days per week: Not on file    Minutes per session: Not on file  . Stress: Not on file  Relationships  . Social Herbalist on phone: Not on file    Gets together: Not on file    Attends religious service: Not on file    Active member of club or organization: Not on file    Attends meetings of clubs or organizations: Not on file    Relationship status: Not on file  . Intimate partner violence    Fear of current or ex partner: Not on file    Emotionally abused: Not on file    Physically abused: Not on file    Forced sexual activity: Not on file  Other Topics Concern  . Not on file  Social History Narrative   Patient lives at home with mom.    Patient does not have any children.    Patient has a 10th grade education.    Patient is single.    Patient is left handed.    Does not have a living will or HPOA- full code.      PHYSICAL EXAM  Vitals:   12/21/18 1032  BP: 124/80  Pulse: 76  Temp: (!) 97.5 F (36.4 C)  SpO2: 99%  Weight: 295 lb (133.8 kg)  Height: 5\' 6"  (1.676 m)   Body mass index is 47.94 kg/m.  Generalized: Well developed, in no acute distress   Neurological examination  Mentation: Alert oriented to time, place, history taking. Follows all commands speech and language fluent Cranial nerve II-XII: Pupils were equal round reactive to light. Extraocular movements were full, visual field were full on  confrontational test. Head turning and shoulder shrug  were normal and symmetric. Motor: The motor testing reveals 5 over 5 strength of all 4 extremities. Good symmetric motor tone is noted throughout.  Sensory: Sensory testing is intact to soft touch on all 4 extremities. No evidence of extinction is noted.  Coordination: Cerebellar testing reveals good finger-nose-finger and heel-to-shin bilaterally.  Gait and station: Gait is normal. Tandem gait is normal. Romberg is negative. No drift is seen.  Reflexes: Deep tendon reflexes are symmetric but depressed throughout  DIAGNOSTIC DATA (LABS, IMAGING, TESTING) - I reviewed patient records, labs, notes, testing and imaging myself where available.  Lab Results  Component Value Date   WBC 6.1 11/09/2018   HGB 11.2 (L) 11/09/2018   HCT 34.9 (L) 11/09/2018   MCV 90.4 11/09/2018   PLT 185.0 11/09/2018      Component Value Date/Time   NA 138 11/09/2018 1012   K 4.0 11/09/2018 1012   CL 106 11/09/2018 1012   CO2 23 11/09/2018 1012   GLUCOSE 104 (H) 11/09/2018 1012   BUN 9 11/09/2018 1012   CREATININE 0.89 11/09/2018 1012   CALCIUM 8.7 11/09/2018 1012   PROT 6.6 11/09/2018 1012   ALBUMIN 4.0 11/09/2018 1012   AST 13 11/09/2018 1012   ALT 14 11/09/2018 1012   ALKPHOS 74 11/09/2018 1012   BILITOT 0.6 11/09/2018 1012   GFRNONAA >60 10/13/2017 0901   GFRAA >60 10/13/2017 0901   Lab Results  Component Value Date   CHOL 179 12/29/2017   HDL 36.40 (L) 12/29/2017   LDLCALC 119 (H) 12/29/2017   LDLDIRECT 86.7 05/06/2012   TRIG 118.0 12/29/2017   CHOLHDL 5 12/29/2017   Lab Results  Component Value Date   HGBA1C 6.1 11/09/2018  No results found for: VITAMINB12 Lab Results  Component Value Date   TSH 4.79 (H) 11/09/2018      ASSESSMENT AND PLAN 48 y.o. year old female  has a past medical history of Anemia (2016), Anxiety, Bipolar disorder ([place]), Cancer ([place]), Complication of anesthesia, Depression, Epigastric hernia, Esophagitis,  GERD (gastroesophageal reflux disease), Hiatal hernia, HOH (hard of hearing), Hypothyroidism, Learning disability, Obesity, PONV (postoperative nausea and vomiting), Pseudoseizures, Seizure ([place]) (2014), Seizures ([place]), Sleep apnea, Thyroid disease, and Ventral hernia. here with:  1.  Seizures  Overall the patient is doing well.  She will continue on gabapentin, Topamax and Lamictal.  I have advised that if she has any seizure events they should let us know.  She will follow-up in 6 months or sooner if needed.   I spent 15 minutes with the patient. 50% of this time was spent reviewing seizure precautions.   Ward Givens, MSN, NP-C 12/21/2018, 10:33 AM [facility] [address] [place], [place] [address] [phone]

## 2018-12-21 NOTE — Patient Instructions (Signed)
Your Plan:  Continue gabapentin, topamax and Lamictal If your symptoms worsen or you develop new symptoms please let us know.    Thank you for coming to see [place] at [facility]. I hope we have been able to provide you high quality care today.  You may receive a patient satisfaction survey over the next few weeks. We would appreciate your feedback and comments so that we may continue to improve ourselves and the health of our patients.

## 2018-12-28 DIAGNOSIS — H95192 Other disorders following mastoidectomy, left ear: Secondary | ICD-10-CM | POA: Diagnosis not present

## 2018-12-28 DIAGNOSIS — H6523 Chronic serous otitis media, bilateral: Secondary | ICD-10-CM | POA: Diagnosis not present

## 2018-12-28 DIAGNOSIS — Z8669 Personal history of other diseases of the nervous system and sense organs: Secondary | ICD-10-CM | POA: Diagnosis not present

## 2018-12-28 DIAGNOSIS — Z9622 Myringotomy tube(s) status: Secondary | ICD-10-CM | POA: Diagnosis not present

## 2018-12-28 DIAGNOSIS — H9212 Otorrhea, left ear: Secondary | ICD-10-CM | POA: Diagnosis not present

## 2019-01-12 ENCOUNTER — Ambulatory Visit: Payer: Medicare Other | Admitting: Family Medicine

## 2019-01-14 ENCOUNTER — Ambulatory Visit: Payer: Medicare Other | Admitting: Family Medicine

## 2019-01-15 ENCOUNTER — Telehealth: Payer: Self-pay

## 2019-01-15 NOTE — Telephone Encounter (Signed)

## 2019-01-18 ENCOUNTER — Ambulatory Visit (INDEPENDENT_AMBULATORY_CARE_PROVIDER_SITE_OTHER): Payer: Medicare Other | Admitting: Family Medicine

## 2019-01-18 ENCOUNTER — Encounter: Payer: Self-pay | Admitting: Family Medicine

## 2019-01-18 ENCOUNTER — Other Ambulatory Visit: Payer: Self-pay

## 2019-01-18 VITALS — HR 83 | Temp 98.4°F | Ht 66.0 in | Wt 292.0 lb

## 2019-01-18 DIAGNOSIS — M722 Plantar fascial fibromatosis: Secondary | ICD-10-CM | POA: Diagnosis not present

## 2019-01-18 DIAGNOSIS — M79672 Pain in left foot: Secondary | ICD-10-CM

## 2019-01-18 DIAGNOSIS — Z23 Encounter for immunization: Secondary | ICD-10-CM

## 2019-01-18 DIAGNOSIS — M79671 Pain in right foot: Secondary | ICD-10-CM | POA: Insufficient documentation

## 2019-01-18 MED ORDER — IBUPROFEN 600 MG PO TABS: 600.0000 mg | ORAL_TABLET | Freq: Three times a day (TID) | ORAL | 0 refills | Status: DC | PRN

## 2019-01-18 NOTE — Assessment & Plan Note (Signed)
>  25 minutes spent in face to face time with patient, >50% spent in counselling or coordination of care discussing plantar fascitis with pt and her daughter.  She does have high arches- we discussed getting inserts for her shoes. eRx also sent in for Ibuprofen 600 mg tablets- advised to take 1 tablet every 4-6 hours for next 2 weeks (with food). Also given supportive care as per sports medicine advisor.  She will also do the home exercises from sports medicine advisor. Call or send my chart message prn if these symptoms worsen or fail to improve as anticipated. If no improvement, refer for formal PT/and or cortisone injections. Pt has MR but her mother agrees with plan.

## 2019-01-18 NOTE — Progress Notes (Signed)
Subjective:   Patient ID: Nancy Hall, female    DOB: Jun 15, 1970, 48 y.o.   MRN: [id number]  Nancy Hall is a pleasant 48 y.o. year old female who presents to clinic today with her mom for feet pain (x 2 months )  on 01/18/2019  HPI:  Bilateral pain on the bottom of her feet, left greater than right for months. Progressive.  Now waking her from sleep.  First few step of day or after first few steps after she stands from a seated position makes it worse.  Not sure what makes it better.  Pain usually starts in her heels and goes to the ball of her foot.   Heel/foot are never warm or tender to touch. No swelling.  Has not taken anything for it.  Current Outpatient Medications on File Prior to Visit  Medication Sig Dispense Refill  . acetaminophen (TYLENOL) 325 MG tablet Take 650 mg by mouth every 6 (six) hours as needed.    . Brexpiprazole (REXULTI) 0.5 MG TABS Take 1 tablet (0.5 mg total) by mouth daily. 30 tablet 2  . escitalopram (LEXAPRO) 10 MG tablet Take 1 tablet (10 mg total) by mouth daily. 90 tablet 1  . EUTHYROX 137 MCG tablet TAKE 1 TABLET BY MOUTH ONCE DAILY BEFORE BREAKFAST 60 tablet 0  . gabapentin (NEURONTIN) 300 MG capsule Take 1 capsule by mouth once daily at bedtime 90 capsule 0  . lamoTRIgine (LAMICTAL) 150 MG tablet Take 1 tablet by mouth twice daily 180 tablet 0  . LORazepam (ATIVAN) 0.5 MG tablet Take 1/2-1 tab po q 6 hours prn 60 tablet 1  . omeprazole (PRILOSEC) 40 MG capsule TAKE 1 CAPSULE (40 MG TOTAL) BY MOUTH DAILY. 90 capsule 0  . topiramate (TOPAMAX) 100 MG tablet One tablet in the morning and 1.5 tablets at night 225 tablet 3   No current facility-administered medications on file prior to visit.     Allergies  Allergen Reactions  . Penicillins Rash    Has patient had a PCN reaction causing immediate rash, facial/tongue/throat swelling, SOB or lightheadedness with hypotension: Yes Has patient had a PCN reaction causing severe rash involving  mucus membranes or skin necrosis: No Has patient had a PCN reaction that required hospitalization: No Has patient had a PCN reaction occurring within the last 10 years: Yes If all of the above answers are "NO", then may proceed with Cephalosporin use.  3g Ancef administered 10/21/2017 without reaction/complic    Past Medical History:  Diagnosis Date  . Anemia 2016  . Anxiety   . Bipolar disorder ([place])   . Cancer ([place])   . Complication of anesthesia   . Depression   . Epigastric hernia   . Esophagitis    LA Class A  . GERD (gastroesophageal reflux disease)   . Hiatal hernia   . HOH (hard of hearing)   . Hypothyroidism   . Learning disability   . Obesity   . PONV (postoperative nausea and vomiting)   . Pseudoseizures   . Seizure ([place]) 2014   last seizure 2(two) years ago  . Seizures ([place])    according to echart- seizures vs pseudoseizures  . Sleep apnea    No cpap  . Thyroid disease   . Ventral hernia     Past Surgical History:  Procedure Laterality Date  . ABDOMINAL HYSTERECTOMY N/A 01/09/2015   Procedure: HYSTERECTOMY ABDOMINAL/BILATERAL SALPINGECTOMY;  Surgeon: Rubie Maid, MD;  Location: [facility];  Service: Gynecology;  Laterality:  N/A;  . DILATION AND CURETTAGE OF UTERUS    . HYSTEROSCOPY W/D&C N/A 12/19/2014   Procedure: DILATATION AND CURETTAGE /HYSTEROSCOPY;  Surgeon: Rubie Maid, MD;  Location: [facility];  Service: Gynecology;  Laterality: N/A;  . INNER EAR SURGERY Bilateral 1994   poor historian  . INSERTION OF MESH N/A 10/21/2017   Procedure: INSERTION OF MESH;  Surgeon: Donnie Mesa, MD;  Location: [facility];  Service: General;  Laterality: N/A;  . TONSILLECTOMY    . VENTRAL HERNIA REPAIR N/A 10/21/2017   Procedure: OPEN REPAIR EPIGASTIC VENTRAL HERNIA WITH  MESH ERAS PATHWAY;  Surgeon: Donnie Mesa, MD;  Location: [facility];  Service: General;  Laterality: N/A;    Family History  Problem Relation Age of Onset  . Colon polyps Mother   . Celiac disease  Mother   . Depression Mother   . Anxiety disorder Mother   . Colon polyps Father   . Celiac disease Brother   . Diabetes Sister   . Depression Sister   . Depression Brother   . CVA Brother   . Diabetes Paternal Grandmother   . Heart disease Paternal Grandfather   . Colon polyps Paternal Aunt   . Diabetes Maternal Aunt   . Heart disease Maternal Grandfather   . Heart disease Maternal Uncle   . Heart disease Maternal Aunt   . Seizures Neg Hx     Social History   Socioeconomic History  . Marital status: Single    Spouse name: Not on file  . Number of children: 0  . Years of education: 10  . Highest education level: Not on file  Occupational History  . Not on file  Social Needs  . Financial resource strain: Not on file  . Food insecurity    Worry: Not on file    Inability: Not on file  . Transportation needs    Medical: Not on file    Non-medical: Not on file  Tobacco Use  . Smoking status: Never Smoker  . Smokeless tobacco: Never Used  Substance and Sexual Activity  . Alcohol use: No    Alcohol/week: 0.0 standard drinks  . Drug use: No  . Sexual activity: Not Currently  Lifestyle  . Physical activity    Days per week: Not on file    Minutes per session: Not on file  . Stress: Not on file  Relationships  . Social Herbalist on phone: Not on file    Gets together: Not on file    Attends religious service: Not on file    Active member of club or organization: Not on file    Attends meetings of clubs or organizations: Not on file    Relationship status: Not on file  . Intimate partner violence    Fear of current or ex partner: Not on file    Emotionally abused: Not on file    Physically abused: Not on file    Forced sexual activity: Not on file  Other Topics Concern  . Not on file  Social History Narrative   Patient lives at home with mom.    Patient does not have any children.    Patient has a 10th grade education.    Patient is single.     Patient is left handed.    Does not have a living will or HPOA- full code.   The PMH, PSH, Social History, Family History, Medications, and allergies have been reviewed in [facility], and have been updated  if relevant.   Review of Systems  Constitutional: Negative.   HENT: Negative.   Respiratory: Negative.   Cardiovascular: Negative.   Gastrointestinal: Negative.   Endocrine: Negative.   Genitourinary: Negative.   Musculoskeletal: Positive for arthralgias and gait problem.  Skin: Negative.   Allergic/Immunologic: Negative.   Hematological: Negative.   Psychiatric/Behavioral: Negative.   All other systems reviewed and are negative.      Objective:    Pulse 83   Temp 98.4 F (36.9 C) (Oral)   Ht 5\' 6"  (1.676 m)   Wt 292 lb (132.5 kg)   LMP 12/19/2014 (Exact Date)   SpO2 99%   BMI 47.13 kg/m    Physical Exam Vitals signs and nursing note reviewed.  Constitutional:      General: She is not in acute distress.    Appearance: Normal appearance. She is obese. She is not ill-appearing.  HENT:     Head: Normocephalic and atraumatic.     Nose: Nose normal.     Mouth/Throat:     Mouth: Mucous membranes are moist.  Eyes:     Extraocular Movements: Extraocular movements intact.  Neck:     Musculoskeletal: Normal range of motion.  Cardiovascular:     Rate and Rhythm: Normal rate.     Pulses: Normal pulses.  Pulmonary:     Effort: Pulmonary effort is normal.  Feet:     Right foot:     Skin integrity: Skin integrity normal. No ulcer, erythema or warmth.     Left foot:     Skin integrity: Skin integrity normal. No ulcer, erythema or warmth.     Comments: Left foot- TTP over plantar fascia insertion on left heel.  She does have high foot arches bilaterally. Skin:    General: Skin is warm and dry.  Neurological:     General: No focal deficit present.     Mental Status: She is alert. Mental status is at baseline.  Psychiatric:        Mood and Affect: Mood normal.            Assessment & Plan:   Need for immunization against influenza - Plan: Flu Vaccine QUAD 36+ mos IM  Bilateral foot pain  Plantar fasciitis, bilateral No follow-ups on file.

## 2019-01-18 NOTE — Patient Instructions (Addendum)
Health Maintenance Due  Topic Date Due  . INFLUENZA VACCINE  10/31/2018    Depression screen [facility] 2/9 01/21/2018 12/19/2016 06/27/2015  Decreased Interest 1 1 1   Down, Depressed, Hopeless 0 1 1  PHQ - 2 Score 1 2 2   Altered sleeping - 1 -  Tired, decreased energy - 1 -  Change in appetite - 1 -  Feeling bad or failure about yourself  - 1 -  Trouble concentrating - 1 -  Moving slowly or fidgety/restless - 1 -  Suicidal thoughts - 0 -  PHQ-9 Score - 8 -  Difficult doing work/chores - Somewhat difficult -   'Great to see you.  Do exercises as directed.  Take Ibuprofen 600 mg two - 4 times daily WITH food for two weeks.  Please keep me updated.

## 2019-01-25 DIAGNOSIS — H6523 Chronic serous otitis media, bilateral: Secondary | ICD-10-CM | POA: Diagnosis not present

## 2019-01-26 ENCOUNTER — Other Ambulatory Visit: Payer: Self-pay | Admitting: Neurology

## 2019-01-26 NOTE — Progress Notes (Signed)
Virtual Visit via Video Note  I connected with patient on 01/27/19 at 11:00 AM EDT by audio enabled telemedicine application and verified that I am speaking with the correct person using two identifiers.   THIS ENCOUNTER IS A VIRTUAL VISIT DUE TO COVID-19 - PATIENT WAS NOT SEEN IN THE OFFICE. PATIENT HAS CONSENTED TO VIRTUAL VISIT / TELEMEDICINE VISIT   Location of patient: home  Location of provider: office  I discussed the limitations of evaluation and management by telemedicine and the availability of in person appointments. The patient expressed understanding and agreed to proceed.   Subjective:   Nancy Hall is a 48 y.o. female who presents for Medicare Annual (Subsequent) preventive examination.  Review of Systems:   Home Safety/Smoke Alarms: Feels safe in home. Smoke alarms in place.  Lives w/ mom, sister, niece, and nephew in [place] apt[address] on [address]nd floor.    Female:   Pap- pt is unsure     Mammo- 03/10/18               Objective:     Vitals: Unable to assess. This visit is enabled though telemedicine due to Covid 19.   Advanced Directives 01/27/2019 01/21/2018 10/13/2017 12/19/2016 01/06/2015 12/19/2014 12/07/2014  Does Patient Have a Medical Advance Directive? No No No No No No No  Would patient like information on creating a medical advance directive? No - Patient declined Yes (MAU/Ambulatory/Procedural Areas - Information given) No - Patient declined Yes (MAU/Ambulatory/Procedural Areas - Information given) No - patient declined information No - patient declined information No - patient declined information    Tobacco Social History   Tobacco Use  Smoking Status Never Smoker  Smokeless Tobacco Never Used     Counseling given: Not Answered   Clinical Intake: Pain : No/denies pain     Past Medical History:  Diagnosis Date  . Anemia 2016  . Anxiety   . Bipolar disorder ([place])   . Cancer ([place])   . Complication of anesthesia   . Depression   . Epigastric  hernia   . Esophagitis    LA Class A  . GERD (gastroesophageal reflux disease)   . Hiatal hernia   . HOH (hard of hearing)   . Hypothyroidism   . Learning disability   . Obesity   . PONV (postoperative nausea and vomiting)   . Pseudoseizures   . Seizure ([place]) 2014   last seizure 2(two) years ago  . Seizures ([place])    according to echart- seizures vs pseudoseizures  . Sleep apnea    No cpap  . Thyroid disease   . Ventral hernia    Past Surgical History:  Procedure Laterality Date  . ABDOMINAL HYSTERECTOMY N/A 01/09/2015   Procedure: HYSTERECTOMY ABDOMINAL/BILATERAL SALPINGECTOMY;  Surgeon: Rubie Maid, MD;  Location: [facility];  Service: Gynecology;  Laterality: N/A;  . DILATION AND CURETTAGE OF UTERUS    . HYSTEROSCOPY W/D&C N/A 12/19/2014   Procedure: DILATATION AND CURETTAGE /HYSTEROSCOPY;  Surgeon: Rubie Maid, MD;  Location: [facility];  Service: Gynecology;  Laterality: N/A;  . INNER EAR SURGERY Bilateral 1994   poor historian  . INSERTION OF MESH N/A 10/21/2017   Procedure: INSERTION OF MESH;  Surgeon: Donnie Mesa, MD;  Location: [facility];  Service: General;  Laterality: N/A;  . TONSILLECTOMY    . VENTRAL HERNIA REPAIR N/A 10/21/2017   Procedure: OPEN REPAIR EPIGASTIC VENTRAL HERNIA WITH  MESH ERAS PATHWAY;  Surgeon: Donnie Mesa, MD;  Location: [facility];  Service: General;  Laterality: N/A;   Family History  Problem Relation Age of Onset  . Colon polyps Mother   . Celiac disease Mother   . Depression Mother   . Anxiety disorder Mother   . Colon polyps Father   . Celiac disease Brother   . Diabetes Sister   . Depression Sister   . Depression Brother   . CVA Brother   . Diabetes Paternal Grandmother   . Heart disease Paternal Grandfather   . Colon polyps Paternal Aunt   . Diabetes Maternal Aunt   . Heart disease Maternal Grandfather   . Heart disease Maternal Uncle   . Heart disease Maternal Aunt   . Seizures Neg Hx    Social History   Socioeconomic History   . Marital status: Single    Spouse name: Not on file  . Number of children: 0  . Years of education: 10  . Highest education level: Not on file  Occupational History  . Not on file  Social Needs  . Financial resource strain: Not on file  . Food insecurity    Worry: Not on file    Inability: Not on file  . Transportation needs    Medical: Not on file    Non-medical: Not on file  Tobacco Use  . Smoking status: Never Smoker  . Smokeless tobacco: Never Used  Substance and Sexual Activity  . Alcohol use: No    Alcohol/week: 0.0 standard drinks  . Drug use: No  . Sexual activity: Not Currently  Lifestyle  . Physical activity    Days per week: Not on file    Minutes per session: Not on file  . Stress: Not on file  Relationships  . Social Herbalist on phone: Not on file    Gets together: Not on file    Attends religious service: Not on file    Active member of club or organization: Not on file    Attends meetings of clubs or organizations: Not on file    Relationship status: Not on file  Other Topics Concern  . Not on file  Social History Narrative   Patient lives at home with mom.    Patient does not have any children.    Patient has a 10th grade education.    Patient is single.    Patient is left handed.    Does not have a living will or HPOA- full code.    Outpatient Encounter Medications as of 01/27/2019  Medication Sig  . acetaminophen (TYLENOL) 325 MG tablet Take 650 mg by mouth every 6 (six) hours as needed.  . Brexpiprazole (REXULTI) 0.5 MG TABS Take 1 tablet (0.5 mg total) by mouth daily.  Marland Kitchen escitalopram (LEXAPRO) 10 MG tablet Take 1 tablet (10 mg total) by mouth daily.  Arna Medici 137 MCG tablet TAKE 1 TABLET BY MOUTH ONCE DAILY BEFORE BREAKFAST  . gabapentin (NEURONTIN) 300 MG capsule Take 1 capsule by mouth once daily at bedtime  . ibuprofen (ADVIL) 600 MG tablet Take 1 tablet (600 mg total) by mouth every 8 (eight) hours as needed (with food).  For two weeks.  . lamoTRIgine (LAMICTAL) 150 MG tablet Take 1 tablet by mouth twice daily  . LORazepam (ATIVAN) 0.5 MG tablet Take 1/2-1 tab po q 6 hours prn  . omeprazole (PRILOSEC) 40 MG capsule TAKE 1 CAPSULE (40 MG TOTAL) BY MOUTH DAILY.  Marland Kitchen topiramate (TOPAMAX) 100 MG tablet TAKE 1 TABLET BY MOUTH IN THE MORNING AND  1 & 1/2 (ONE & ONE-HALF) TABLET AT BEDTIME  . [DISCONTINUED] topiramate (TOPAMAX) 100 MG tablet One tablet in the morning and 1.5 tablets at night   No facility-administered encounter medications on file as of 01/27/2019.     Activities of Daily Living In your present state of health, do you have any difficulty performing the following activities: 01/27/2019  Hearing? N  Vision? N  Difficulty concentrating or making decisions? Y  Walking or climbing stairs? N  Dressing or bathing? N  Doing errands, shopping? Y  Comment never driven  Conservation officer, nature and eating ? Y  Using the Toilet? N  In the past six months, have you accidently leaked urine? N  Do you have problems with loss of bowel control? N  Managing your Medications? Y  Managing your Finances? Y  Housekeeping or managing your Housekeeping? Y  Some recent data might be hidden    Patient Care Team: Lucille Passy, MD as PCP - General (Family Medicine) Philemon Kingdom, MD as Consulting Physician (Internal Medicine) Rubie Maid, MD as Referring Physician (Obstetrics and Gynecology) Dennie Bible, NP as Nurse Practitioner (Family Medicine) Ladene Artist, MD as Consulting Physician (Gastroenterology)    Assessment:   This is a routine wellness examination for [place]. Physical assessment deferred to PCP.  Exercise Activities and Dietary recommendations Current Exercise Habits: The patient does not participate in regular exercise at present, Exercise limited by: None identified Diet (meal preparation, eat out, water intake, caffeinated beverages, dairy products, fruits and vegetables): in general,  an "unhealthy" diet   Goals    . Increase physical activity     Starting 12/24/2016, I will attempt to walk at least 15 min daily as weather permits.        Fall Risk Fall Risk  01/27/2019 03/30/2018 01/21/2018 12/19/2016 06/27/2015  Falls in the past year? 1 0 No No No  Number falls in past yr: 1 - - - -  Injury with Fall? 0 - - - -  Follow up Education provided;Falls prevention discussed Falls evaluation completed - - -   Depression Screen PHQ 2/9 Scores 01/27/2019 01/18/2019 11/09/2018 10/01/2018  PHQ - 2 Score 0 0 - -  PHQ- 9 Score - 6 - -  Exception Documentation - - Other- indicate reason in comment box Other- indicate reason in comment box  Not completed - - Pt sees Psychiatry See psych     Cognitive Function  MMSE - Mini Mental State Exam 01/21/2018 12/19/2016  Orientation to time 5 5  Orientation to Place 5 5  Registration 3 3  Attention/ Calculation 5 0  Recall 3 3  Language- name 2 objects 2 0  Language- repeat 0 1  Language- follow 3 step command 3 2  Language- follow 3 step command-comments - unable to follow 1 step of 3 step command  Language- read & follow direction 1 0  Write a sentence 1 0  Copy design 1 0  Total score 29 19        Immunization History  Administered Date(s) Administered  . Influenza, Seasonal, Injecte, Preservative Fre 04/08/2012  . Influenza,inj,Quad PF,6+ Mos 12/31/2012, 04/24/2015, 01/01/2016, 12/26/2016, 12/29/2017, 01/18/2019   Screening Tests Health Maintenance  Topic Date Due  . TETANUS/TDAP  12/20/2026 (Originally 10/08/1991)  . INFLUENZA VACCINE  Completed  . HIV Screening  Discontinued      Plan:    Please schedule your next medicare wellness visit with me in 1 yr.  Continue to eat heart healthy  diet (full of fruits, vegetables, whole grains, lean protein, water--limit salt, fat, and sugar intake) and increase physical activity as tolerated.  Continue doing brain stimulating activities (puzzles, reading, adult coloring  books, staying active) to keep memory sharp.   Bring a copy of your living will and/or healthcare power of attorney to your next office visit.    I have personally reviewed and noted the following in the patient's chart:   . Medical and social history . Use of alcohol, tobacco or illicit drugs  . Current medications and supplements . Functional ability and status . Nutritional status . Physical activity . Advanced directives . List of other physicians . Hospitalizations, surgeries, and ER visits in previous 12 months . Vitals . Screenings to include cognitive, depression, and falls . Referrals and appointments  In addition, I have reviewed and discussed with patient certain preventive protocols, quality metrics, and best practice recommendations. A written personalized care plan for preventive services as well as general preventive health recommendations were provided to patient.     Shela Nevin, [place]  01/27/2019

## 2019-01-27 ENCOUNTER — Other Ambulatory Visit: Payer: Self-pay

## 2019-01-27 ENCOUNTER — Ambulatory Visit (INDEPENDENT_AMBULATORY_CARE_PROVIDER_SITE_OTHER): Payer: Medicare Other | Admitting: *Deleted

## 2019-01-27 ENCOUNTER — Encounter: Payer: Self-pay | Admitting: *Deleted

## 2019-01-27 DIAGNOSIS — Z0000 Encounter for general adult medical examination without abnormal findings: Secondary | ICD-10-CM

## 2019-01-27 NOTE — Patient Instructions (Signed)
Please schedule your next medicare wellness visit with me in 1 yr.  Continue to eat heart healthy diet (full of fruits, vegetables, whole grains, lean protein, water--limit salt, fat, and sugar intake) and increase physical activity as tolerated.  Continue doing brain stimulating activities (puzzles, reading, adult coloring books, staying active) to keep memory sharp.   Bring a copy of your living will and/or healthcare power of attorney to your next office visit.   Nancy Hall , Thank you for taking time to come for your Medicare Wellness Visit. I appreciate your ongoing commitment to your health goals. Please review the following plan we discussed and let me know if I can assist you in the future.   These are the goals we discussed: Goals    . Increase physical activity     Starting 12/24/2016, I will attempt to walk at least 15 min daily as weather permits.        This is a list of the screening recommended for you and due dates:  Health Maintenance  Topic Date Due  . Tetanus Vaccine  12/20/2026*  . Flu Shot  Completed  . HIV Screening  Discontinued  *Topic was postponed. The date shown is not the original due date.    Preventive Care 64-50 Years Old, Female Preventive care refers to visits with your health care provider and lifestyle choices that can promote health and wellness. This includes:  A yearly physical exam. This may also be called an annual well check.  Regular dental visits and eye exams.  Immunizations.  Screening for certain conditions.  Healthy lifestyle choices, such as eating a healthy diet, getting regular exercise, not using drugs or products that contain nicotine and tobacco, and limiting alcohol use. What can I expect for my preventive care visit? Physical exam Your health care provider will check your:  Height and weight. This may be used to calculate body mass index (BMI), which tells if you are at a healthy weight.  Heart rate and blood pressure.   Skin for abnormal spots. Counseling Your health care provider may ask you questions about your:  Alcohol, tobacco, and drug use.  Emotional well-being.  Home and relationship well-being.  Sexual activity.  Eating habits.  Work and work Statistician.  Method of birth control.  Menstrual cycle.  Pregnancy history. What immunizations do I need?  Influenza (flu) vaccine  This is recommended every year. Tetanus, diphtheria, and pertussis (Tdap) vaccine  You may need a Td booster every 10 years. Varicella (chickenpox) vaccine  You may need this if you have not been vaccinated. Zoster (shingles) vaccine  You may need this after age 55. Measles, mumps, and rubella (MMR) vaccine  You may need at least one dose of MMR if you were born in 1957 or later. You may also need a second dose. Pneumococcal conjugate (PCV13) vaccine  You may need this if you have certain conditions and were not previously vaccinated. Pneumococcal polysaccharide (PPSV23) vaccine  You may need one or two doses if you smoke cigarettes or if you have certain conditions. Meningococcal conjugate (MenACWY) vaccine  You may need this if you have certain conditions. Hepatitis A vaccine  You may need this if you have certain conditions or if you travel or work in places where you may be exposed to hepatitis A. Hepatitis B vaccine  You may need this if you have certain conditions or if you travel or work in places where you may be exposed to hepatitis B. Haemophilus influenzae  type b (Hib) vaccine  You may need this if you have certain conditions. Human papillomavirus (HPV) vaccine  If recommended by your health care provider, you may need three doses over 6 months. You may receive vaccines as individual doses or as more than one vaccine together in one shot (combination vaccines). Talk with your health care provider about the risks and benefits of combination vaccines. What tests do I need? Blood  tests  Lipid and cholesterol levels. These may be checked every 5 years, or more frequently if you are over 69 years old.  Hepatitis C test.  Hepatitis B test. Screening  Lung cancer screening. You may have this screening every year starting at age 88 if you have a 30-pack-year history of smoking and currently smoke or have quit within the past 15 years.  Colorectal cancer screening. All adults should have this screening starting at age 76 and continuing until age 45. Your health care provider may recommend screening at age 56 if you are at increased risk. You will have tests every 1-10 years, depending on your results and the type of screening test.  Diabetes screening. This is done by checking your blood sugar (glucose) after you have not eaten for a while (fasting). You may have this done every 1-3 years.  Mammogram. This may be done every 1-2 years. Talk with your health care provider about when you should start having regular mammograms. This may depend on whether you have a family history of breast cancer.  BRCA-related cancer screening. This may be done if you have a family history of breast, ovarian, tubal, or peritoneal cancers.  Pelvic exam and Pap test. This may be done every 3 years starting at age 32. Starting at age 47, this may be done every 5 years if you have a Pap test in combination with an HPV test. Other tests  Sexually transmitted disease (STD) testing.  Bone density scan. This is done to screen for osteoporosis. You may have this scan if you are at high risk for osteoporosis. Follow these instructions at home: Eating and drinking  Eat a diet that includes fresh fruits and vegetables, whole grains, lean protein, and low-fat dairy.  Take vitamin and mineral supplements as recommended by your health care provider.  Do not drink alcohol if: ? Your health care provider tells you not to drink. ? You are pregnant, may be pregnant, or are planning to become pregnant.   If you drink alcohol: ? Limit how much you have to 0-1 drink a day. ? Be aware of how much alcohol is in your drink. In the [place] one drink equals one 12 oz bottle of beer (355 mL), one 5 oz glass of wine (148 mL), or one 1 oz glass of hard liquor (44 mL). Lifestyle  Take daily care of your teeth and gums.  Stay active. Exercise for at least 30 minutes on 5 or more days each week.  Do not use any products that contain nicotine or tobacco, such as cigarettes, e-cigarettes, and chewing tobacco. If you need help quitting, ask your health care provider.  If you are sexually active, practice safe sex. Use a condom or other form of birth control (contraception) in order to prevent pregnancy and STIs (sexually transmitted infections).  If told by your health care provider, take low-dose aspirin daily starting at age 61. What's next?  Visit your health care provider once a year for a well check visit.  Ask your health care provider how often  you should have your eyes and teeth checked.  Stay up to date on all vaccines. This information is not intended to replace advice given to you by your health care provider. Make sure you discuss any questions you have with your health care provider. Document Released: 04/14/2015 Document Revised: 11/27/2017 Document Reviewed: 11/27/2017 Elsevier Patient Education  2020 [employer].

## 2019-02-15 ENCOUNTER — Other Ambulatory Visit: Payer: Self-pay | Admitting: Internal Medicine

## 2019-02-15 ENCOUNTER — Other Ambulatory Visit (HOSPITAL_COMMUNITY): Payer: Self-pay | Admitting: Surgery

## 2019-02-15 ENCOUNTER — Other Ambulatory Visit: Payer: Self-pay | Admitting: Surgery

## 2019-02-15 DIAGNOSIS — R1011 Right upper quadrant pain: Secondary | ICD-10-CM

## 2019-02-17 ENCOUNTER — Telehealth: Payer: Self-pay | Admitting: Psychiatry

## 2019-02-17 NOTE — Telephone Encounter (Signed)
Mother called to update: Nancy Hall is seeming to be worse over the last two weeks. She is bossy and pushing family around. Just FYI since she is coming in  11/19.

## 2019-02-18 ENCOUNTER — Other Ambulatory Visit: Payer: Self-pay

## 2019-02-18 ENCOUNTER — Ambulatory Visit: Payer: Medicare Other | Admitting: Psychiatry

## 2019-02-18 ENCOUNTER — Ambulatory Visit (INDEPENDENT_AMBULATORY_CARE_PROVIDER_SITE_OTHER): Payer: Medicare Other | Admitting: Psychiatry

## 2019-02-18 VITALS — Wt 291.0 lb

## 2019-02-18 DIAGNOSIS — F39 Unspecified mood [affective] disorder: Secondary | ICD-10-CM | POA: Diagnosis not present

## 2019-02-18 DIAGNOSIS — F418 Other specified anxiety disorders: Secondary | ICD-10-CM

## 2019-02-18 DIAGNOSIS — F5101 Primary insomnia: Secondary | ICD-10-CM

## 2019-02-18 MED ORDER — BREXPIPRAZOLE 1 MG PO TABS: 1.0000 mg | ORAL_TABLET | Freq: Every day | ORAL | 1 refills | Status: DC

## 2019-02-18 NOTE — Progress Notes (Signed)
Nancy Hall [id number] [birth year] 48 y.o.  Subjective:   Patient ID:  Nancy Hall is a 48 y.o. (DOB 04/25/1970) female.  Chief Complaint:  Chief Complaint  Patient presents with  . Anxiety  . Agitation    HPI CHAZITY BRU presents to the office today for follow-up of mood, anxiety, and h/o paranoia. Pt seen emergently due to mother reporting that she has exhibited behavior changes over the last 2 weeks and has been telling family what to do. She is accompanied by her brother.  Pt reports that she has had some increased sadness about the death of her father. Has had some anxiety. Some increase in frustration and aggravation. Her brother reports that she will then "speak her mind" and confront others. Her brother reports that she has been fussing more at family and saying that they are not listening. Brother reports that pt will worry and ruminate about her sister. Continues to have anxiety about possibly running out of items. They deny any panic s/s. Denies paranoia.   Sleep has been "off and on." She reports that is occasionally having difficulty getting back to sleep after awakening. She has been waking up and eating during the night. Appetite has been the same during the day. Energy and motivation have been low for walking. Continues to help with chores. Reports some recent difficulties with concentration. Denies SI.   She has been using Lorazepam prn and it has been effective for several hours.   Reports that she forgot to take Lexapro. Reports that she re-started Lexapro yesterday.   Past medication trials: Abilify-helpful for mood signs and symptoms. Had tremor and involuntary orofacial movements. Possible weight gain. Vraylar- Had tremor and oral facial movements Latuda-ineffective for mood signs and symptoms. No tolerability issues. Fanapt Rexulti- Effective, well tolerated.  Sertraline-has taken long-term. May have been helpful for depression. Lamictal-prescribed  for seizures by neurologist. Patient has taken long-term and effect on mood is unclear. Topamax-taken long-term and was started for seizures. Depakote-prescribed for seizures in the past. Lithium- tremor, hot flashes, worsening mood.  Review of Systems:  Review of Systems  Gastrointestinal: Positive for abdominal distention and abdominal pain.  Musculoskeletal: Negative for gait problem.       Pain in feet and hands  Neurological: Negative for tremors.  Psychiatric/Behavioral:       Please refer to HPI    Medications: I have reviewed the patient's current medications.  Current Outpatient Medications  Medication Sig Dispense Refill  . acetaminophen (TYLENOL) 325 MG tablet Take 650 mg by mouth every 6 (six) hours as needed.    Marland Kitchen escitalopram (LEXAPRO) 10 MG tablet Take 1 tablet (10 mg total) by mouth daily. 90 tablet 1  . EUTHYROX 137 MCG tablet TAKE 1 TABLET BY MOUTH ONCE DAILY BEFORE BREAKFAST 60 tablet 0  . gabapentin (NEURONTIN) 300 MG capsule Take 1 capsule by mouth once daily at bedtime 90 capsule 0  . ibuprofen (ADVIL) 600 MG tablet Take 1 tablet (600 mg total) by mouth every 8 (eight) hours as needed (with food). For two weeks. 30 tablet 0  . lamoTRIgine (LAMICTAL) 150 MG tablet Take 1 tablet by mouth twice daily 180 tablet 0  . LORazepam (ATIVAN) 0.5 MG tablet Take 1/2-1 tab po q 6 hours prn 60 tablet 1  . omeprazole (PRILOSEC) 40 MG capsule TAKE 1 CAPSULE (40 MG TOTAL) BY MOUTH DAILY. 90 capsule 0  . topiramate (TOPAMAX) 100 MG tablet TAKE 1 TABLET BY MOUTH IN THE MORNING AND  1 & 1/2 (ONE & ONE-HALF) TABLET AT BEDTIME 225 tablet 0  . brexpiprazole (REXULTI) 1 MG TABS tablet Take 1 tablet (1 mg total) by mouth daily. 30 tablet 1   No current facility-administered medications for this visit.     Medication Side Effects: Other: Reports increased hunger at night.   Allergies:  Allergies  Allergen Reactions  . Penicillins Rash    Has patient had a PCN reaction causing  immediate rash, facial/tongue/throat swelling, SOB or lightheadedness with hypotension: Yes Has patient had a PCN reaction causing severe rash involving mucus membranes or skin necrosis: No Has patient had a PCN reaction that required hospitalization: No Has patient had a PCN reaction occurring within the last 10 years: Yes If all of the above answers are "NO", then may proceed with Cephalosporin use.  3g Ancef administered 10/21/2017 without reaction/complic    Past Medical History:  Diagnosis Date  . Anemia 2016  . Anxiety   . Bipolar disorder ([place])   . Cancer ([place])   . Complication of anesthesia   . Depression   . Epigastric hernia   . Esophagitis    LA Class A  . GERD (gastroesophageal reflux disease)   . Hiatal hernia   . HOH (hard of hearing)   . Hypothyroidism   . Learning disability   . Obesity   . PONV (postoperative nausea and vomiting)   . Pseudoseizures   . Seizure ([place]) 2014   last seizure 2(two) years ago  . Seizures ([place])    according to echart- seizures vs pseudoseizures  . Sleep apnea    No cpap  . Thyroid disease   . Ventral hernia     Family History  Problem Relation Age of Onset  . Colon polyps Mother   . Celiac disease Mother   . Depression Mother   . Anxiety disorder Mother   . Colon polyps Father   . Celiac disease Brother   . Diabetes Sister   . Depression Sister   . Depression Brother   . CVA Brother   . Diabetes Paternal Grandmother   . Heart disease Paternal Grandfather   . Colon polyps Paternal Aunt   . Diabetes Maternal Aunt   . Heart disease Maternal Grandfather   . Heart disease Maternal Uncle   . Heart disease Maternal Aunt   . Seizures Neg Hx     Social History   Socioeconomic History  . Marital status: Single    Spouse name: Not on file  . Number of children: 0  . Years of education: 10  . Highest education level: Not on file  Occupational History  . Not on file  Social Needs  . Financial resource strain: Not on  file  . Food insecurity    Worry: Not on file    Inability: Not on file  . Transportation needs    Medical: Not on file    Non-medical: Not on file  Tobacco Use  . Smoking status: Never Smoker  . Smokeless tobacco: Never Used  Substance and Sexual Activity  . Alcohol use: No    Alcohol/week: 0.0 standard drinks  . Drug use: No  . Sexual activity: Not Currently  Lifestyle  . Physical activity    Days per week: Not on file    Minutes per session: Not on file  . Stress: Not on file  Relationships  . Social Herbalist on phone: Not on file    Gets together: Not on file  Attends religious service: Not on file    Active member of club or organization: Not on file    Attends meetings of clubs or organizations: Not on file    Relationship status: Not on file  . Intimate partner violence    Fear of current or ex partner: Not on file    Emotionally abused: Not on file    Physically abused: Not on file    Forced sexual activity: Not on file  Other Topics Concern  . Not on file  Social History Narrative   Patient lives at home with mom.    Patient does not have any children.    Patient has a 10th grade education.    Patient is single.    Patient is left handed.    Does not have a living will or HPOA- full code.    Past Medical History, Surgical history, Social history, and Family history were reviewed and updated as appropriate.   Please see review of systems for further details on the patient's review from today.   Objective:   Physical Exam:  Wt 291 lb (132 kg)   LMP 12/19/2014 (Exact Date)   BMI 46.97 kg/m   Physical Exam  Lab Review:     Component Value Date/Time   NA 138 11/09/2018 1012   K 4.0 11/09/2018 1012   CL 106 11/09/2018 1012   CO2 23 11/09/2018 1012   GLUCOSE 104 (H) 11/09/2018 1012   BUN 9 11/09/2018 1012   CREATININE 0.89 11/09/2018 1012   CALCIUM 8.7 11/09/2018 1012   PROT 6.6 11/09/2018 1012   ALBUMIN 4.0 11/09/2018 1012   AST  13 11/09/2018 1012   ALT 14 11/09/2018 1012   ALKPHOS 74 11/09/2018 1012   BILITOT 0.6 11/09/2018 1012   GFRNONAA >60 10/13/2017 0901   GFRAA >60 10/13/2017 0901       Component Value Date/Time   WBC 6.1 11/09/2018 1012   RBC 3.86 (L) 11/09/2018 1012   HGB 11.2 (L) 11/09/2018 1012   HGB 12.3 11/03/2014 1100   HCT 34.9 (L) 11/09/2018 1012   HCT 35.7 11/03/2014 1100   PLT 185.0 11/09/2018 1012   MCV 90.4 11/09/2018 1012   MCH 29.8 10/13/2017 0901   MCHC 32.2 11/09/2018 1012   RDW 15.7 (H) 11/09/2018 1012   LYMPHSABS 1.4 11/09/2018 1012   MONOABS 0.3 11/09/2018 1012   EOSABS 0.3 11/09/2018 1012   BASOSABS 0.1 11/09/2018 1012    No results found for: POCLITH, LITHIUM   No results found for: PHENYTOIN, PHENOBARB, VALPROATE, CBMZ   .res Assessment: Plan:   Discussed that resuming Lexapro will likely be helpful for anxiety and depressive s/s. Discussed option to increase Rexulti since this could potentially improve mood and anxiety s/s more quickly. Discussed potential benefits, risks, and side effects of increase in [place]. Pt requests increase in Rexulti since she reports that s/s are currently causing distress. Pt to f/u in 4 weeks or sooner if clinically indicated.  Patient advised to contact office with any questions, adverse effects, or acute worsening in signs and symptoms.  Serrina was seen today for anxiety and agitation.  Diagnoses and all orders for this visit:  Episodic mood disorder ([place]) -     brexpiprazole (REXULTI) 1 MG TABS tablet; Take 1 tablet (1 mg total) by mouth daily.     Please see After Visit Summary for patient specific instructions.  Future Appointments  Date Time Provider Box  02/23/2019 10:00 AM Philemon Kingdom, MD [id number]  None  02/24/2019  7:00 AM [facility] 2 [facility]  03/19/2019 10:30 AM Thayer Headings, PMHNP [facility] None  12/21/2019 10:30 AM Ward Givens, NP [facility] None    No orders of the defined types were placed  in this encounter.   -------------------------------

## 2019-02-18 NOTE — Progress Notes (Signed)
   02/18/19 1153  Facial and Oral Movements  Muscles of Facial Expression 0  Lips and Perioral Area 0  Jaw 0  Tongue 0  Extremity Movements  Upper (arms, wrists, hands, fingers) 0  Lower (legs, knees, ankles, toes) 0  Trunk Movements  Neck, shoulders, hips 0  Overall Severity  Severity of abnormal movements (highest score from questions above) 0  Incapacitation due to abnormal movements 0  Patient's awareness of abnormal movements (rate only patient's report) 0  AIMS Total Score  AIMS Total Score 0

## 2019-02-20 ENCOUNTER — Encounter: Payer: Self-pay | Admitting: Psychiatry

## 2019-02-23 ENCOUNTER — Other Ambulatory Visit: Payer: Self-pay | Admitting: Family Medicine

## 2019-02-23 ENCOUNTER — Other Ambulatory Visit: Payer: Self-pay

## 2019-02-23 ENCOUNTER — Encounter: Payer: Self-pay | Admitting: Internal Medicine

## 2019-02-23 ENCOUNTER — Ambulatory Visit (INDEPENDENT_AMBULATORY_CARE_PROVIDER_SITE_OTHER): Payer: Medicare Other | Admitting: Internal Medicine

## 2019-02-23 VITALS — BP 120/60 | HR 82 | Ht 66.0 in | Wt 294.0 lb

## 2019-02-23 DIAGNOSIS — E039 Hypothyroidism, unspecified: Secondary | ICD-10-CM

## 2019-02-23 DIAGNOSIS — R232 Flushing: Secondary | ICD-10-CM

## 2019-02-23 LAB — T4, FREE: Free T4: 1.13 ng/dL (ref 0.60–1.60)

## 2019-02-23 LAB — TSH: TSH: 2.74 u[IU]/mL (ref 0.35–4.50)

## 2019-02-23 MED ORDER — LEVOTHYROXINE SODIUM 137 MCG PO TABS: ORAL_TABLET | ORAL | 3 refills | Status: DC

## 2019-02-23 NOTE — Patient Instructions (Signed)
Please continue Levothyroxine 137 mcg daily.  Take the thyroid hormone every day, with water, at least 30 minutes before breakfast, separated by at least 4 hours from: - acid reflux medications - calcium - iron - multivitamins  Please stop at the lab.  Please come back for a follow-up appointment in 1 year. 

## 2019-02-23 NOTE — Progress Notes (Signed)
Patient ID: Nancy Hall, female   DOB: Jan 16, 1971, 48 y.o.   MRN: [id number]  This visit occurred during the SARS-CoV-2 public health emergency.  Safety protocols were in place, including screening questions prior to the visit, additional usage of staff PPE, and extensive cleaning of exam room while observing appropriate contact time as indicated for disinfecting solutions.   HPI  Nancy Hall is a 48 y.o.-year-old female, returning for f/u for uncontrolled hypothyroidism. Last visit 1 year ago.  She is here with her mother who offers most of the history as patient is mostly nonverbal.  Reviewed and addended history: Pt. has been dx with hypothyroidism in 5062 (48 years old); she is on Levothyroxine 75 >> then started LT5 100 mcg + Cytomel 25  Daily. We have been trying to reduce her Cytomel >>  LT4 125 mcg + LT3 5 mcg daily >> on LT4 137 mcg daily.  She takes the levothyroxine: -Daily -Around 6 AM -Fasting -Separated by at least 30 minutes from breakfast -no Ca, Fe, MVI -+ PPIs (Omeprazole) - unclear if at night or in am (will check at home). She had AP >> will have HIDA scan tomorow -Not on biotin  Reviewed her TFTs: Lab Results  Component Value Date   TSH 4.79 (H) 11/09/2018   TSH 2.96 02/24/2018   TSH 5.49 (H) 12/29/2017   TSH 3.86 07/29/2017   TSH 2.49 06/02/2017   TSH 18.41 (H) 04/03/2017   TSH 4.99 (H) 02/24/2017   TSH 2.14 11/05/2016   TSH 0.81 08/23/2016   TSH 3.98 02/01/2016   FREET4 1.04 11/09/2018   FREET4 0.94 02/24/2018   FREET4 0.94 12/29/2017   FREET4 0.81 06/02/2017   FREET4 0.56 (L) 04/03/2017   FREET4 1.10 02/24/2017   FREET4 1.07 11/05/2016   FREET4 0.92 08/23/2016   FREET4 0.74 02/01/2016   FREET4 0.69 12/21/2015    Pt denies: - feeling nodules in neck - hoarseness - choking - SOB with lying down She has occasional dysphagia - chronic.  She has + FH of thyroid disorders in: MGM.No FH of thyroid cancer. No h/o radiation tx to head or  neck.  No seaweed or kelp. No recent contrast studies. No herbal supplements. No Biotin use. No recent steroids use.   She has a history of bipolar disease (seeing a counselor and psychiatrist), depression, GERD, h/o fibroids, h/o hysterectomy, h/o ear infections, h/o seizures.  At last visit, she was telling me that her therapist was thinking about changing her psychotropic medications to Abilify, to also help with weight loss. She tried Abilify >> mouth tremors >> changed to [place] >> more depressed >> started Vraylar before last visit, now Rexulti, Lexapro, Topamax, continues Lamictal.  Continues to have hot flashes, but they improved.  ROS: Constitutional: no weight gain/no weight loss, no fatigue, + subjective hyperthermia, no subjective hypothermia Eyes: no blurry vision, no xerophthalmia ENT: no sore throat, + see HPI Cardiovascular: no CP/no SOB/no palpitations/no leg swelling Respiratory: no cough/no SOB/no wheezing Gastrointestinal: no N/no V/no D/no C/no acid reflux Musculoskeletal: no muscle aches/no joint aches Skin: no rashes, no hair loss Neurological: no tremors/no numbness/no tingling/no dizziness  I reviewed pt's medications, allergies, PMH, social hx, family hx, and changes were documented in the history of present illness. Otherwise, unchanged from my initial visit note.  Past Medical History:  Diagnosis Date  . Anemia 2016  . Anxiety   . Bipolar disorder ([place])   . Cancer ([place])   . Complication of anesthesia   .  Depression   . Epigastric hernia   . Esophagitis    LA Class A  . GERD (gastroesophageal reflux disease)   . Hiatal hernia   . HOH (hard of hearing)   . Hypothyroidism   . Learning disability   . Obesity   . PONV (postoperative nausea and vomiting)   . Pseudoseizures   . Seizure ([place]) 2014   last seizure 2(two) years ago  . Seizures ([place])    according to echart- seizures vs pseudoseizures  . Sleep apnea    No cpap  . Thyroid disease   .  Ventral hernia    Past Surgical History:  Procedure Laterality Date  . ABDOMINAL HYSTERECTOMY N/A 01/09/2015   Procedure: HYSTERECTOMY ABDOMINAL/BILATERAL SALPINGECTOMY;  Surgeon: Rubie Maid, MD;  Location: [facility];  Service: Gynecology;  Laterality: N/A;  . DILATION AND CURETTAGE OF UTERUS    . HYSTEROSCOPY W/D&C N/A 12/19/2014   Procedure: DILATATION AND CURETTAGE /HYSTEROSCOPY;  Surgeon: Rubie Maid, MD;  Location: [facility];  Service: Gynecology;  Laterality: N/A;  . INNER EAR SURGERY Bilateral 1994   poor historian  . INSERTION OF MESH N/A 10/21/2017   Procedure: INSERTION OF MESH;  Surgeon: Donnie Mesa, MD;  Location: [facility];  Service: General;  Laterality: N/A;  . TONSILLECTOMY    . VENTRAL HERNIA REPAIR N/A 10/21/2017   Procedure: OPEN REPAIR EPIGASTIC VENTRAL HERNIA WITH  MESH ERAS PATHWAY;  Surgeon: Donnie Mesa, MD;  Location: [facility];  Service: General;  Laterality: N/A;   Social History   Social History  . Marital Status: Single    Spouse Name: N/A  . Number of Children: 0  . Years of Education: 10   Social History Main Topics  . Smoking status: Never Smoker   . Smokeless tobacco: Never Used  . Alcohol Use: No  . Drug Use: No   Social History Narrative   Patient lives at home with mom.    Patient does not have any children.    Patient has a 10th grade education.    Patient is single.    Patient is left handed.    Does not have a living will or HPOA- full code.   Current Outpatient Medications on File Prior to Visit  Medication Sig Dispense Refill  . acetaminophen (TYLENOL) 325 MG tablet Take 650 mg by mouth every 6 (six) hours as needed.    . brexpiprazole (REXULTI) 1 MG TABS tablet Take 1 tablet (1 mg total) by mouth daily. 30 tablet 1  . escitalopram (LEXAPRO) 10 MG tablet Take 1 tablet (10 mg total) by mouth daily. 90 tablet 1  . EUTHYROX 137 MCG tablet TAKE 1 TABLET BY MOUTH ONCE DAILY BEFORE BREAKFAST 60 tablet 0  . gabapentin (NEURONTIN) 300 MG  capsule Take 1 capsule by mouth once daily at bedtime 90 capsule 0  . ibuprofen (ADVIL) 600 MG tablet Take 1 tablet (600 mg total) by mouth every 8 (eight) hours as needed (with food). For two weeks. 30 tablet 0  . lamoTRIgine (LAMICTAL) 150 MG tablet Take 1 tablet by mouth twice daily 180 tablet 0  . LORazepam (ATIVAN) 0.5 MG tablet Take 1/2-1 tab po q 6 hours prn 60 tablet 1  . omeprazole (PRILOSEC) 40 MG capsule TAKE 1 CAPSULE (40 MG TOTAL) BY MOUTH DAILY. 90 capsule 0  . topiramate (TOPAMAX) 100 MG tablet TAKE 1 TABLET BY MOUTH IN THE MORNING AND 1 & 1/2 (ONE & ONE-HALF) TABLET AT BEDTIME 225 tablet 0   No  current facility-administered medications on file prior to visit.    Allergies  Allergen Reactions  . Penicillins Rash    Has patient had a PCN reaction causing immediate rash, facial/tongue/throat swelling, SOB or lightheadedness with hypotension: Yes Has patient had a PCN reaction causing severe rash involving mucus membranes or skin necrosis: No Has patient had a PCN reaction that required hospitalization: No Has patient had a PCN reaction occurring within the last 10 years: Yes If all of the above answers are "NO", then may proceed with Cephalosporin use.  3g Ancef administered 10/21/2017 without reaction/complic   Family History  Problem Relation Age of Onset  . Colon polyps Mother   . Celiac disease Mother   . Depression Mother   . Anxiety disorder Mother   . Colon polyps Father   . Celiac disease Brother   . Diabetes Sister   . Depression Sister   . Depression Brother   . CVA Brother   . Diabetes Paternal Grandmother   . Heart disease Paternal Grandfather   . Colon polyps Paternal Aunt   . Diabetes Maternal Aunt   . Heart disease Maternal Grandfather   . Heart disease Maternal Uncle   . Heart disease Maternal Aunt   . Seizures Neg Hx    PE: BP 120/60   Pulse 82   Ht 5\' 6"  (1.676 m)   Wt 294 lb (133.4 kg)   LMP 12/19/2014 (Exact Date)   SpO2 99%   BMI 47.45  kg/m  Wt Readings from Last 3 Encounters:  02/23/19 294 lb (133.4 kg)  01/18/19 292 lb (132.5 kg)  12/21/18 295 lb (133.8 kg)   Constitutional: overweight, in NAD Eyes: PERRLA, EOMI, no exophthalmos ENT: moist mucous membranes, no thyromegaly, no cervical lymphadenopathy Cardiovascular: RRR, No MRG Respiratory: CTA B Gastrointestinal: abdomen soft, NT, ND, BS+ Musculoskeletal: no deformities, strength intact in all 4 Skin: moist, warm, no rashes Neurological: no tremor with outstretched hands, DTR normal in all 4  ASSESSMENT: 1. Hypothyroidism  2. Obesity class III  3.  Hot flashes  PLAN:  1. Patient with uncontrolled, hypothyroidism, previously on LT4 and LT3.  We have been gradually decreasing her LT3 dose and we stopped completely summer 2018.  Her head tremors improved after stopping LT3.  Also, her TFTs normalized, but they restarted to fluctuate slightly afterwards. - latest thyroid labs reviewed with pt >> slightly high in 10/2018.  I was not aware of the result, but most likely would have continue the same dose since previous labs were normal dose. Lab Results  Component Value Date   TSH 4.79 (H) 11/09/2018   - she continues on LT4 137 mcg daily - pt feels good on this dose but continues to have hot flushes - we discussed about taking the thyroid hormone every day, with water, >30 minutes before breakfast, separated by >4 hours from acid reflux medications, calcium, iron, multivitamins. Pt. is taking it correctly. - will check thyroid tests today: TSH and fT4 - If labs are abnormal, she will need to return for repeat TFTs in 1.5 months  2. Obesity class III -No significant weight loss since last visit - at last visit, I recommended a: Weight loss center and I referred her to Dr. Adair Patter.  She did not have this appointment yet.  3.  Hot flashes -History of TAH + BSO for fibroids -She has hot flashes at night - on Neurontin -At last visits, we discussed about seeing  OB/GYN to start HRT and continue until  she is 48 years old.  She was seen by OB/GYN, but did not start HRT,?  Contraindications)  Office Visit on 02/23/2019  Component Date Value Ref Range Status  . TSH 02/23/2019 2.74  0.35 - 4.50 uIU/mL Final  . Free T4 02/23/2019 1.13  0.60 - 1.60 ng/dL Final   Comment: Specimens from patients who are undergoing biotin therapy and /or ingesting biotin supplements may contain high levels of biotin.  The higher biotin concentration in these specimens interferes with this Free T4 assay.  Specimens that contain high levels  of biotin may cause false high results for this Free T4 assay.  Please interpret results in light of the total clinical presentation of the patient.     Normal TFTs.  Philemon Kingdom, MD PhD [facility]

## 2019-02-24 ENCOUNTER — Ambulatory Visit (HOSPITAL_COMMUNITY): Payer: Medicare Other

## 2019-02-24 ENCOUNTER — Encounter (HOSPITAL_COMMUNITY)
Admission: RE | Admit: 2019-02-24 | Discharge: 2019-02-24 | Disposition: A | Discharge status: Home / Self Care | Payer: Medicare Other | Source: Ambulatory Visit | Attending: Surgery | Admitting: Surgery

## 2019-02-24 DIAGNOSIS — R1011 Right upper quadrant pain: Secondary | ICD-10-CM | POA: Diagnosis not present

## 2019-02-24 MED ORDER — TECHNETIUM TC 99M MEBROFENIN IV KIT
5.4800 | PACK | Freq: Once | INTRAVENOUS | Status: AC | PRN
  Administered 2019-02-24: 5.48 via INTRAVENOUS

## 2019-02-27 ENCOUNTER — Other Ambulatory Visit: Payer: Self-pay | Admitting: Family Medicine

## 2019-02-27 NOTE — Progress Notes (Signed)
Please call the patient and let them know that their HIDA scan was normal.  The GB is likely not the source of her symptoms.  I will glad to see her back as needed.

## 2019-03-01 ENCOUNTER — Ambulatory Visit: Payer: Medicare Other | Admitting: Psychiatry

## 2019-03-01 MED ORDER — OMEPRAZOLE 40 MG PO CPDR: 40.0000 mg | DELAYED_RELEASE_CAPSULE | Freq: Every day | ORAL | 3 refills | Status: AC

## 2019-03-18 ENCOUNTER — Other Ambulatory Visit: Payer: Self-pay | Admitting: Neurology

## 2019-03-19 ENCOUNTER — Encounter: Payer: Self-pay | Admitting: Psychiatry

## 2019-03-19 ENCOUNTER — Ambulatory Visit (INDEPENDENT_AMBULATORY_CARE_PROVIDER_SITE_OTHER): Payer: Medicare Other | Admitting: Psychiatry

## 2019-03-19 DIAGNOSIS — F39 Unspecified mood [affective] disorder: Secondary | ICD-10-CM | POA: Diagnosis not present

## 2019-03-19 DIAGNOSIS — F5101 Primary insomnia: Secondary | ICD-10-CM | POA: Diagnosis not present

## 2019-03-19 DIAGNOSIS — F418 Other specified anxiety disorders: Secondary | ICD-10-CM

## 2019-03-19 MED ORDER — BREXPIPRAZOLE 2 MG PO TABS: 2.0000 mg | ORAL_TABLET | Freq: Every day | ORAL | 1 refills | Status: DC

## 2019-03-19 MED ORDER — LORAZEPAM 0.5 MG PO TABS: ORAL_TABLET | ORAL | 1 refills | Status: DC

## 2019-03-19 NOTE — Progress Notes (Signed)
Nancy Hall [id number] November 19, 1970 48 y.o.  Virtual Visit via Telephone Note  I connected with pt on 03/19/19 at 10:30 AM EST by telephone and verified that I am speaking with the correct person using two identifiers.   I discussed the limitations, risks, security and privacy concerns of performing an evaluation and management service by telephone and the availability of in person appointments. I also discussed with the patient that there may be a patient responsible charge related to this service. The patient expressed understanding and agreed to proceed.   I discussed the assessment and treatment plan with the patient. The patient was provided an opportunity to ask questions and all were answered. The patient agreed with the plan and demonstrated an understanding of the instructions.   The patient was advised to call back or seek an in-person evaluation if the symptoms worsen or if the condition fails to improve as anticipated.  I provided 30 minutes of non-face-to-face time during this encounter.  The patient was located at home.  The provider was located at [place].   Thayer Headings, PMHNP   Subjective:   Patient ID:  Nancy Hall is a 48 y.o. (DOB [birth year]) female.  Chief Complaint:  Chief Complaint  Patient presents with  . Depression  . Anxiety    HPI Nancy Hall presents for follow-up of anxiety, depression, and h/o psychosis and insomnia. Mother reports that pt has not had any improvement in s/s and has possibly had some worsening s/s. Pt reports that she has been more anxious and worried. Mother reports that Ativan is partially effective for pt's anxiety. Pt reports worsening sad mood. She reports that she has been thinking more about her father that is deceased. Mother reports that she is not able to drive pt and this has seemed to have a negative impact on her mood. They report that she continues to get frustrated and irritated with others. Mother  reports that pt will say things like, "no one listens to me..." and no one is helping her. Mother reports that pt is frequently picking at her skin. Goes to bed around 8 pm and then gets up and moves to the living room, and returns to sleep. Appetite has been "too good." Denies paranoia. Low energy and motivation. Sitting on the couch most of the time and will periodically help with household chores. Impatient at times. Denies SI.   Mother denies manic s/s.   Mother reports that pt will ask them to take her somewhere and then changes her mind.   Mother reports that they are helping her with medication compliance and setting up her medication box.   Mother reports that pets typically calm pt down.   Mother will have back surgery on 04/01/19 and mother will be going to her sister's to recover from surgery since they have stairs. Mother has also had acute eye issues.   Past medication trials: Abilify-helpful for mood signs and symptoms. Had tremor and involuntary orofacial movements. Possible weight gain. Vraylar- Had tremor and oral facial movements Latuda-ineffective for mood signs and symptoms. No tolerability issues. Fanapt Rexulti- Effective, well tolerated. Sertraline-has taken long-term. May have been helpful for depression. Lexapro Lamictal-prescribed for seizures by neurologist. Patient has taken long-term and effect on mood is unclear. Topamax-taken long-term and was started for seizures. Depakote-prescribed for seizures in the past. Lithium- tremor, hot flashes, worsening mood.  Review of Systems:  Review of Systems  HENT: Positive for ear discharge and ear pain.  Musculoskeletal: Negative for gait problem.  Skin:       Sores on skin. Picking at skin.   Neurological: Negative for tremors.       Denies involuntary movements  Psychiatric/Behavioral:       Please refer to HPI    Medications: I have reviewed the patient's current medications.  Current Outpatient  Medications  Medication Sig Dispense Refill  . acetaminophen (TYLENOL) 325 MG tablet Take 650 mg by mouth every 6 (six) hours as needed.    . brexpiprazole (REXULTI) 2 MG TABS tablet Take 1 tablet (2 mg total) by mouth daily. 30 tablet 1  . gabapentin (NEURONTIN) 300 MG capsule Take 1 capsule by mouth once daily at bedtime 90 capsule 3  . ibuprofen (ADVIL) 600 MG tablet Take 1 tablet (600 mg total) by mouth every 8 (eight) hours as needed (with food). For two weeks. 30 tablet 0  . lamoTRIgine (LAMICTAL) 150 MG tablet Take 1 tablet by mouth twice daily 180 tablet 0  . levothyroxine (EUTHYROX) 137 MCG tablet TAKE 1 TABLET BY MOUTH ONCE DAILY BEFORE BREAKFAST 90 tablet 3  . LORazepam (ATIVAN) 0.5 MG tablet Take 1/2-1 tab po q 6 hours prn 60 tablet 1  . omeprazole (PRILOSEC) 40 MG capsule Take 1 capsule (40 mg total) by mouth daily. 90 capsule 3  . topiramate (TOPAMAX) 100 MG tablet TAKE 1 TABLET BY MOUTH IN THE MORNING AND 1 & 1/2 (ONE & ONE-HALF) TABLET AT BEDTIME 225 tablet 0  . escitalopram (LEXAPRO) 10 MG tablet Take 1 tablet (10 mg total) by mouth daily. 90 tablet 1   No current facility-administered medications for this visit.    Medication Side Effects: None  Allergies:  Allergies  Allergen Reactions  . Penicillins Rash    Has patient had a PCN reaction causing immediate rash, facial/tongue/throat swelling, SOB or lightheadedness with hypotension: Yes Has patient had a PCN reaction causing severe rash involving mucus membranes or skin necrosis: No Has patient had a PCN reaction that required hospitalization: No Has patient had a PCN reaction occurring within the last 10 years: Yes If all of the above answers are "NO", then may proceed with Cephalosporin use.  3g Ancef administered 10/21/2017 without reaction/complic    Past Medical History:  Diagnosis Date  . Anemia 2016  . Anxiety   . Bipolar disorder ([place])   . Cancer ([place])   . Complication of anesthesia   . Depression   .  Epigastric hernia   . Esophagitis    LA Class A  . GERD (gastroesophageal reflux disease)   . Hiatal hernia   . HOH (hard of hearing)   . Hypothyroidism   . Learning disability   . Obesity   . PONV (postoperative nausea and vomiting)   . Pseudoseizures   . Seizure ([place]) 2014   last seizure 2(two) years ago  . Seizures ([place])    according to echart- seizures vs pseudoseizures  . Sleep apnea    No cpap  . Thyroid disease   . Ventral hernia     Family History  Problem Relation Age of Onset  . Colon polyps Mother   . Celiac disease Mother   . Depression Mother   . Anxiety disorder Mother   . Colon polyps Father   . Celiac disease Brother   . Diabetes Sister   . Depression Sister   . Depression Brother   . CVA Brother   . Diabetes Paternal Grandmother   . Heart disease Paternal  Grandfather   . Colon polyps Paternal Aunt   . Diabetes Maternal Aunt   . Heart disease Maternal Grandfather   . Heart disease Maternal Uncle   . Heart disease Maternal Aunt   . Seizures Neg Hx     Social History   Socioeconomic History  . Marital status: Single    Spouse name: Not on file  . Number of children: 0  . Years of education: 10  . Highest education level: Not on file  Occupational History  . Not on file  Tobacco Use  . Smoking status: Never Smoker  . Smokeless tobacco: Never Used  Substance and Sexual Activity  . Alcohol use: No    Alcohol/week: 0.0 standard drinks  . Drug use: No  . Sexual activity: Not Currently  Other Topics Concern  . Not on file  Social History Narrative   Patient lives at home with mom.    Patient does not have any children.    Patient has a 10th grade education.    Patient is single.    Patient is left handed.    Does not have a living will or HPOA- full code.   Social Determinants of Health   Financial Resource Strain:   . Difficulty of Paying Living Expenses: Not on file  Food Insecurity:   . Worried About Charity fundraiser in the  Last Year: Not on file  . Ran Out of Food in the Last Year: Not on file  Transportation Needs:   . Lack of Transportation (Medical): Not on file  . Lack of Transportation (Non-Medical): Not on file  Physical Activity:   . Days of Exercise per Week: Not on file  . Minutes of Exercise per Session: Not on file  Stress:   . Feeling of Stress : Not on file  Social Connections:   . Frequency of Communication with Friends and Family: Not on file  . Frequency of Social Gatherings with Friends and Family: Not on file  . Attends Religious Services: Not on file  . Active Member of Clubs or Organizations: Not on file  . Attends Archivist Meetings: Not on file  . Marital Status: Not on file  Intimate Partner Violence:   . Fear of Current or Ex-Partner: Not on file  . Emotionally Abused: Not on file  . Physically Abused: Not on file  . Sexually Abused: Not on file    Past Medical History, Surgical history, Social history, and Family history were reviewed and updated as appropriate.   Please see review of systems for further details on the patient's review from today.   Objective:   Physical Exam:  LMP 12/19/2014 (Exact Date)   Physical Exam Neurological:     Mental Status: She is alert and oriented to person, place, and time.     Cranial Nerves: No dysarthria.  Psychiatric:        Attention and Perception: Attention and perception normal.        Mood and Affect: Mood is anxious and depressed.        Speech: Speech normal.        Behavior: Behavior is withdrawn.        Thought Content: Thought content normal. Thought content is not paranoid or delusional. Thought content does not include homicidal or suicidal ideation. Thought content does not include homicidal or suicidal plan.        Cognition and Memory: Cognition and memory normal.  Judgment: Judgment normal.     Comments: Insight fair     Lab Review:     Component Value Date/Time   NA 138 11/09/2018 1012    K 4.0 11/09/2018 1012   CL 106 11/09/2018 1012   CO2 23 11/09/2018 1012   GLUCOSE 104 (H) 11/09/2018 1012   BUN 9 11/09/2018 1012   CREATININE 0.89 11/09/2018 1012   CALCIUM 8.7 11/09/2018 1012   PROT 6.6 11/09/2018 1012   ALBUMIN 4.0 11/09/2018 1012   AST 13 11/09/2018 1012   ALT 14 11/09/2018 1012   ALKPHOS 74 11/09/2018 1012   BILITOT 0.6 11/09/2018 1012   GFRNONAA >60 10/13/2017 0901   GFRAA >60 10/13/2017 0901       Component Value Date/Time   WBC 6.1 11/09/2018 1012   RBC 3.86 (L) 11/09/2018 1012   HGB 11.2 (L) 11/09/2018 1012   HGB 12.3 11/03/2014 1100   HCT 34.9 (L) 11/09/2018 1012   HCT 35.7 11/03/2014 1100   PLT 185.0 11/09/2018 1012   MCV 90.4 11/09/2018 1012   MCH 29.8 10/13/2017 0901   MCHC 32.2 11/09/2018 1012   RDW 15.7 (H) 11/09/2018 1012   LYMPHSABS 1.4 11/09/2018 1012   MONOABS 0.3 11/09/2018 1012   EOSABS 0.3 11/09/2018 1012   BASOSABS 0.1 11/09/2018 1012    No results found for: POCLITH, LITHIUM   No results found for: PHENYTOIN, PHENOBARB, VALPROATE, CBMZ   .res Assessment: Plan:   Pt seen for 30 minutes and greater than 50% of visit spent counseling pt and her mother re: potential benefits, risks, and side effects of increasing Rexulti to improve depression and anxiety. Higher doses of Rexulti would also improve possible psychotic s/s since mother reports that pt seems suspicious of family's intentions at times. Discussed not increasing Lexapro since pt had increased irritability in the past with Lexapro 20 mg po qd.  Increase Rexulti to 2 mg po qd to improve mood and anxiety. Continue Lexapro 10 mg po qd for mood and anxiety. Continue Ativan prn anxiety.  Pt to f/u in 4 weeks or sooner if clinically indicated.  Patient advised to contact office with any questions, adverse effects, or acute worsening in signs and symptoms.  Nancy Hall was seen today for depression and anxiety.  Diagnoses and all orders for this visit:  Episodic mood disorder  ([place]) -     brexpiprazole (REXULTI) 2 MG TABS tablet; Take 1 tablet (2 mg total) by mouth daily.  Other specified anxiety disorders -     LORazepam (ATIVAN) 0.5 MG tablet; Take 1/2-1 tab po q 6 hours prn  Primary insomnia    Please see After Visit Summary for patient specific instructions.  Future Appointments  Date Time Provider [place]  08/25/2019 10:15 AM Philemon Kingdom, MD [facility] None  12/21/2019 10:30 AM Ward Givens, NP [facility] None    No orders of the defined types were placed in this encounter.     -------------------------------

## 2019-04-09 DIAGNOSIS — H9211 Otorrhea, right ear: Secondary | ICD-10-CM | POA: Diagnosis not present

## 2019-04-09 DIAGNOSIS — Z8669 Personal history of other diseases of the nervous system and sense organs: Secondary | ICD-10-CM | POA: Diagnosis not present

## 2019-04-09 DIAGNOSIS — Z9622 Myringotomy tube(s) status: Secondary | ICD-10-CM | POA: Diagnosis not present

## 2019-04-09 DIAGNOSIS — H6521 Chronic serous otitis media, right ear: Secondary | ICD-10-CM | POA: Diagnosis not present

## 2019-04-17 ENCOUNTER — Other Ambulatory Visit: Payer: Self-pay | Admitting: Internal Medicine

## 2019-04-25 ENCOUNTER — Other Ambulatory Visit: Payer: Self-pay | Admitting: Psychiatry

## 2019-04-25 ENCOUNTER — Other Ambulatory Visit: Payer: Self-pay | Admitting: Neurology

## 2019-04-25 ENCOUNTER — Other Ambulatory Visit: Payer: Self-pay | Admitting: Family Medicine

## 2019-04-25 DIAGNOSIS — F3164 Bipolar disorder, current episode mixed, severe, with psychotic features: Secondary | ICD-10-CM

## 2019-04-26 NOTE — Telephone Encounter (Signed)
Last OV 01/18/19   Last fill 01/18/19  #30/0

## 2019-04-29 ENCOUNTER — Ambulatory Visit: Payer: Medicare Other | Admitting: Psychiatry

## 2019-05-26 ENCOUNTER — Other Ambulatory Visit: Payer: Self-pay | Admitting: Psychiatry

## 2019-05-26 DIAGNOSIS — F39 Unspecified mood [affective] disorder: Secondary | ICD-10-CM

## 2019-05-28 ENCOUNTER — Encounter: Payer: Self-pay | Admitting: Family Medicine

## 2019-05-28 ENCOUNTER — Telehealth (INDEPENDENT_AMBULATORY_CARE_PROVIDER_SITE_OTHER): Payer: Medicare HMO | Admitting: Family Medicine

## 2019-05-28 VITALS — Ht 66.0 in

## 2019-05-28 DIAGNOSIS — L0100 Impetigo, unspecified: Secondary | ICD-10-CM | POA: Diagnosis not present

## 2019-05-28 DIAGNOSIS — L281 Prurigo nodularis: Secondary | ICD-10-CM | POA: Diagnosis not present

## 2019-05-28 MED ORDER — CLARITHROMYCIN ER 500 MG PO TB24: 1000.0000 mg | ORAL_TABLET | Freq: Every day | ORAL | 0 refills | Status: AC

## 2019-05-28 NOTE — Progress Notes (Signed)
Established Patient Office Visit  Subjective:  Patient ID: Nancy Hall, female    DOB: 1971-03-02  Age: 49 y.o. MRN: [id number]  CC:  Chief Complaint  Patient presents with  . Rash    red/blackish itchy rash on legs and stomach x 2-3 weeks. patient states that itt does have some draining from the rash, no smell.     HPI Nancy Hall presents for evaluation and treatment of a rash on her lower abdomen and legs that does ooze at times.  There is discomfort associated with the rash.  She admits that she picks at it regularly.  Denies fever or pustule formation.  Past Medical History:  Diagnosis Date  . Anemia 2016  . Anxiety   . Bipolar disorder ([place])   . Cancer ([place])   . Complication of anesthesia   . Depression   . Epigastric hernia   . Esophagitis    LA Class A  . GERD (gastroesophageal reflux disease)   . Hiatal hernia   . HOH (hard of hearing)   . Hypothyroidism   . Learning disability   . Obesity   . PONV (postoperative nausea and vomiting)   . Pseudoseizures   . Seizure ([place]) 2014   last seizure 2(two) years ago  . Seizures ([place])    according to echart- seizures vs pseudoseizures  . Sleep apnea    No cpap  . Thyroid disease   . Ventral hernia     Past Surgical History:  Procedure Laterality Date  . ABDOMINAL HYSTERECTOMY N/A 01/09/2015   Procedure: HYSTERECTOMY ABDOMINAL/BILATERAL SALPINGECTOMY;  Surgeon: Rubie Maid, MD;  Location: [facility];  Service: Gynecology;  Laterality: N/A;  . DILATION AND CURETTAGE OF UTERUS    . HYSTEROSCOPY WITH D & C N/A 12/19/2014   Procedure: DILATATION AND CURETTAGE /HYSTEROSCOPY;  Surgeon: Rubie Maid, MD;  Location: [facility];  Service: Gynecology;  Laterality: N/A;  . INNER EAR SURGERY Bilateral 1994   poor historian  . INSERTION OF MESH N/A 10/21/2017   Procedure: INSERTION OF MESH;  Surgeon: Donnie Mesa, MD;  Location: [facility];  Service: General;  Laterality: N/A;  . TONSILLECTOMY    . VENTRAL HERNIA REPAIR N/A  10/21/2017   Procedure: OPEN REPAIR EPIGASTIC VENTRAL HERNIA WITH  MESH ERAS PATHWAY;  Surgeon: Donnie Mesa, MD;  Location: [facility];  Service: General;  Laterality: N/A;    Family History  Problem Relation Age of Onset  . Colon polyps Mother   . Celiac disease Mother   . Depression Mother   . Anxiety disorder Mother   . Colon polyps Father   . Celiac disease Brother   . Diabetes Sister   . Depression Sister   . Depression Brother   . CVA Brother   . Diabetes Paternal Grandmother   . Heart disease Paternal Grandfather   . Colon polyps Paternal Aunt   . Diabetes Maternal Aunt   . Heart disease Maternal Grandfather   . Heart disease Maternal Uncle   . Heart disease Maternal Aunt   . Seizures Neg Hx     Social History   Socioeconomic History  . Marital status: Single    Spouse name: Not on file  . Number of children: 0  . Years of education: 10  . Highest education level: Not on file  Occupational History  . Not on file  Tobacco Use  . Smoking status: Never Smoker  . Smokeless tobacco: Never Used  Substance and Sexual Activity  . Alcohol  use: No    Alcohol/week: 0.0 standard drinks  . Drug use: No  . Sexual activity: Not Currently  Other Topics Concern  . Not on file  Social History Narrative   Patient lives at home with mom.    Patient does not have any children.    Patient has a 10th grade education.    Patient is single.    Patient is left handed.    Does not have a living will or HPOA- full code.   Social Determinants of Health   Financial Resource Strain:   . Difficulty of Paying Living Expenses: Not on file  Food Insecurity:   . Worried About Charity fundraiser in the Last Year: Not on file  . Ran Out of Food in the Last Year: Not on file  Transportation Needs:   . Lack of Transportation (Medical): Not on file  . Lack of Transportation (Non-Medical): Not on file  Physical Activity:   . Days of Exercise per Week: Not on file  . Minutes of Exercise  per Session: Not on file  Stress:   . Feeling of Stress : Not on file  Social Connections:   . Frequency of Communication with Friends and Family: Not on file  . Frequency of Social Gatherings with Friends and Family: Not on file  . Attends Religious Services: Not on file  . Active Member of Clubs or Organizations: Not on file  . Attends Archivist Meetings: Not on file  . Marital Status: Not on file  Intimate Partner Violence:   . Fear of Current or Ex-Partner: Not on file  . Emotionally Abused: Not on file  . Physically Abused: Not on file  . Sexually Abused: Not on file    Outpatient Medications Prior to Visit  Medication Sig Dispense Refill  . acetaminophen (TYLENOL) 325 MG tablet Take 650 mg by mouth every 6 (six) hours as needed.    Arna Medici 137 MCG tablet TAKE 1 TABLET BY MOUTH ONCE DAILY BEFORE BREAKFAST 90 tablet 1  . gabapentin (NEURONTIN) 300 MG capsule Take 1 capsule by mouth once daily at bedtime 90 capsule 3  . ibuprofen (ADVIL) 600 MG tablet TAKE 1 TABLET BY MOUTH EVERY 8 HOURS AS NEEDED WITH FOOD FOR 2 WEEKS 30 tablet 0  . lamoTRIgine (LAMICTAL) 150 MG tablet Take 1 tablet by mouth twice daily 180 tablet 0  . LORazepam (ATIVAN) 0.5 MG tablet Take 1/2-1 tab po q 6 hours prn 60 tablet 1  . omeprazole (PRILOSEC) 40 MG capsule Take 1 capsule (40 mg total) by mouth daily. 90 capsule 3  . REXULTI 2 MG TABS tablet Take 1 tablet by mouth once daily 30 tablet 0  . topiramate (TOPAMAX) 100 MG tablet TAKE 1 TABLET BY MOUTH ONCE DAILY IN THE MORNING AND 1 & 1/2 (ONE & ONE-HALF) ONCE DAILY AT BEDTIME 225 tablet 0  . escitalopram (LEXAPRO) 10 MG tablet Take 1 tablet (10 mg total) by mouth daily. 90 tablet 1   No facility-administered medications prior to visit.    Allergies  Allergen Reactions  . Penicillins Rash    Has patient had a PCN reaction causing immediate rash, facial/tongue/throat swelling, SOB or lightheadedness with hypotension: Yes Has patient had a PCN  reaction causing severe rash involving mucus membranes or skin necrosis: No Has patient had a PCN reaction that required hospitalization: No Has patient had a PCN reaction occurring within the last 10 years: Yes If all of the above answers  are "NO", then may proceed with Cephalosporin use.  3g Ancef administered 10/21/2017 without reaction/complic    ROS Review of Systems  Constitutional: Negative.   Respiratory: Negative.   Cardiovascular: Negative.   Gastrointestinal: Negative.   Skin: Positive for color change, rash and wound.      Objective:    Physical Exam  Constitutional: She is oriented to person, place, and time. She appears well-developed and well-nourished. No distress.  HENT:  Head: Normocephalic and atraumatic.  Right Ear: External ear normal.  Left Ear: External ear normal.  Eyes: Right eye exhibits no discharge. Left eye exhibits no discharge. No scleral icterus.  Pulmonary/Chest: Effort normal.  Neurological: She is alert and oriented to person, place, and time.  Skin: She is not diaphoretic.     Psychiatric: She has a normal mood and affect. Her behavior is normal.    Ht 5\' 6"  (1.676 m)   LMP 12/19/2014 (Exact Date)   BMI 47.45 kg/m  Wt Readings from Last 3 Encounters:  02/23/19 294 lb (133.4 kg)  01/18/19 292 lb (132.5 kg)  12/21/18 295 lb (133.8 kg)     There are no preventive care reminders to display for this patient.  There are no preventive care reminders to display for this patient.  Lab Results  Component Value Date   TSH 2.74 02/23/2019   Lab Results  Component Value Date   WBC 6.1 11/09/2018   HGB 11.2 (L) 11/09/2018   HCT 34.9 (L) 11/09/2018   MCV 90.4 11/09/2018   PLT 185.0 11/09/2018   Lab Results  Component Value Date   NA 138 11/09/2018   K 4.0 11/09/2018   CO2 23 11/09/2018   GLUCOSE 104 (H) 11/09/2018   BUN 9 11/09/2018   CREATININE 0.89 11/09/2018   BILITOT 0.6 11/09/2018   ALKPHOS 74 11/09/2018   AST 13  11/09/2018   ALT 14 11/09/2018   PROT 6.6 11/09/2018   ALBUMIN 4.0 11/09/2018   CALCIUM 8.7 11/09/2018   ANIONGAP 9 10/13/2017   GFR 68.26 11/09/2018   Lab Results  Component Value Date   CHOL 179 12/29/2017   Lab Results  Component Value Date   HDL 36.40 (L) 12/29/2017   Lab Results  Component Value Date   LDLCALC 119 (H) 12/29/2017   Lab Results  Component Value Date   TRIG 118.0 12/29/2017   Lab Results  Component Value Date   CHOLHDL 5 12/29/2017   Lab Results  Component Value Date   HGBA1C 6.1 11/09/2018      Assessment & Plan:   Problem List Items Addressed This Visit      Musculoskeletal and Integument   Impetigo - Primary   Relevant Medications   clarithromycin (BIAXIN XL) 500 MG 24 hr tablet   Picker's nodule      Meds ordered this encounter  Medications  . clarithromycin (BIAXIN XL) 500 MG 24 hr tablet    Sig: Take 2 tablets (1,000 mg total) by mouth daily for 10 days.    Dispense:  20 tablet    Refill:  0    Follow-up: Return if symptoms worsen or fail to improve.  I asked the patient to do her best not to pick at these rashes.  She said that she would try.   Libby Maw, MD   Virtual Visit via Video Note  I connected with Nancy Hall on 05/28/19 at  9:30 AM EST by a video enabled telemedicine application and verified that I am speaking  with the correct person using two identifiers.  Location: Patient: home with her sister.  Provider:    I discussed the limitations of evaluation and management by telemedicine and the availability of in person appointments. The patient expressed understanding and agreed to proceed.  History of Present Illness:    Observations/Objective:   Assessment and Plan:   Follow Up Instructions:    I discussed the assessment and treatment plan with the patient. The patient was provided an opportunity to ask questions and all were answered. The patient agreed with the plan and demonstrated  an understanding of the instructions.   The patient was advised to call back or seek an in-person evaluation if the symptoms worsen or if the condition fails to improve as anticipated.  I provided 20 minutes of non-face-to-face time during this encounter.   Libby Maw, MD

## 2019-06-08 ENCOUNTER — Other Ambulatory Visit: Payer: Self-pay

## 2019-06-08 ENCOUNTER — Encounter: Payer: Self-pay | Admitting: Psychiatry

## 2019-06-08 ENCOUNTER — Ambulatory Visit (INDEPENDENT_AMBULATORY_CARE_PROVIDER_SITE_OTHER): Payer: Medicare HMO | Admitting: Psychiatry

## 2019-06-08 DIAGNOSIS — F418 Other specified anxiety disorders: Secondary | ICD-10-CM

## 2019-06-08 DIAGNOSIS — F319 Bipolar disorder, unspecified: Secondary | ICD-10-CM

## 2019-06-08 DIAGNOSIS — R569 Unspecified convulsions: Secondary | ICD-10-CM | POA: Diagnosis not present

## 2019-06-08 DIAGNOSIS — F39 Unspecified mood [affective] disorder: Secondary | ICD-10-CM

## 2019-06-08 MED ORDER — REXULTI 3 MG PO TABS: 3.0000 mg | ORAL_TABLET | Freq: Every day | ORAL | 2 refills | Status: DC

## 2019-06-08 MED ORDER — BREXPIPRAZOLE 3 MG PO TABS: 2.0000 mg | ORAL_TABLET | Freq: Every day | ORAL | 2 refills | Status: DC

## 2019-06-08 MED ORDER — LAMOTRIGINE 150 MG PO TABS: 150.0000 mg | ORAL_TABLET | Freq: Two times a day (BID) | ORAL | 0 refills | Status: AC

## 2019-06-08 MED ORDER — ESCITALOPRAM OXALATE 10 MG PO TABS: 10.0000 mg | ORAL_TABLET | Freq: Every day | ORAL | 1 refills | Status: AC

## 2019-06-08 MED ORDER — TOPIRAMATE 100 MG PO TABS: ORAL_TABLET | ORAL | 0 refills | Status: AC

## 2019-06-08 NOTE — Progress Notes (Signed)
   06/08/19 1055  Facial and Oral Movements  Muscles of Facial Expression 0  Lips and Perioral Area 0  Jaw 0  Tongue 0  Extremity Movements  Upper (arms, wrists, hands, fingers) 0  Lower (legs, knees, ankles, toes) 0  Trunk Movements  Neck, shoulders, hips 0  Overall Severity  Severity of abnormal movements (highest score from questions above) 0  Incapacitation due to abnormal movements 0  Patient's awareness of abnormal movements (rate only patient's report) 0  AIMS Total Score  AIMS Total Score 0

## 2019-06-08 NOTE — Progress Notes (Signed)
Nancy Hall [id number] October 28, 1970 49 y.o.  Subjective:   Patient ID:  Nancy Hall is a 49 y.o. (DOB [birth year]) female.  Chief Complaint:  Chief Complaint  Patient presents with  . Anxiety  . Depression    HPI Nancy Hall presents to the office today for follow-up of anxiety, depression, and h/o psychosis. She is accompanied by her mother who provides collateral information. Mother reports that pt has been "bossy" and telling other family members what to do. Mother reports that pt seems to be depressed and more irritable. Mother reports that pt has been having panic attacks and c/o chest tightness and feeling short of breath. Mask wearing triggers anxiety. Has had increased anxiety in general. Pt has been going to bed early and waking up early. Appetite has been increased. Energy and motivation have been lower. Occ difficulty with concentration. Diminished enjoyment in things. Mother reports that pt has had more negative thoughts. Denies SI. Denies paranoia.   Mother has been staying with her sister while recovering from back surgery. Mother is now about to have eye surgery. Their family is moving and combining households and pt is excited about this. Mother reports that there has not been any recent s/s of paranoia.   Past medication trials: Abilify-helpful for mood signs and symptoms. Had tremor and involuntary orofacial movements. Possible weight gain. Vraylar- Had tremor and oral facial movements Latuda-ineffective for mood signs and symptoms. No tolerability issues. Fanapt Rexulti- Effective, well tolerated. Sertraline-has taken long-term. May have been helpful for depression. Lexapro Lamictal-prescribed for seizures by neurologist. Patient has taken long-term and effect on mood is unclear. Topamax-taken long-term and was started for seizures. Depakote-prescribed for seizures in the past. Lithium- tremor, hot flashes, worsening mood.  AIMS     Office Visit from  06/08/2019 in [place] Visit from 02/18/2019 in [place] Visit from 11/26/2018 in [place] Visit from 08/04/2018 in [place] Total Score  0  0  0  2    Mini-Mental     Clinical Support from 01/21/2018 in [place] from 12/19/2016 in [place] at [facility]  Total Score (max 30 points )  29  19    PHQ2-9     Video Visit from 05/28/2019 in [place] from 01/27/2019 in [place] Visit from 01/18/2019 in [place] from 01/21/2018 in [place] from 12/19/2016 in [place] at [facility] Total Score  1  0  0  1  2  PHQ-9 Total Score  5  -  6  -  8       Review of Systems:  Review of Systems  Musculoskeletal: Negative for gait problem.  Skin: Positive for rash.       Itching  Neurological: Negative for tremors.  Psychiatric/Behavioral:       Please refer to HPI    Medications: I have reviewed the patient's current medications.  Current Outpatient Medications  Medication Sig Dispense Refill  . EUTHYROX 137 MCG tablet TAKE 1 TABLET BY MOUTH ONCE DAILY BEFORE BREAKFAST 90 tablet 1  . gabapentin (NEURONTIN) 300 MG capsule Take 1 capsule by mouth once daily at bedtime 90 capsule 3  . ibuprofen (ADVIL) 600 MG tablet TAKE 1 TABLET BY MOUTH EVERY 8 HOURS AS NEEDED WITH FOOD FOR 2 WEEKS 30 tablet 0  .  lamoTRIgine (LAMICTAL) 150 MG tablet Take 1 tablet (150 mg total) by mouth 2 (two) times daily. 180 tablet 0  . LORazepam (ATIVAN) 0.5 MG tablet Take 1/2-1 tab po q 6 hours prn 60 tablet 1  . omeprazole (PRILOSEC) 40 MG capsule Take 1 capsule (40 mg total) by mouth daily. 90 capsule 3  . topiramate (TOPAMAX) 100 MG tablet TAKE 1 TABLET BY MOUTH ONCE DAILY IN THE MORNING AND 1 & 1/2  (ONE & ONE-HALF) ONCE DAILY AT BEDTIME 225 tablet 0  . acetaminophen (TYLENOL) 325 MG tablet Take 650 mg by mouth every 6 (six) hours as needed.    . Brexpiprazole (REXULTI) 3 MG TABS Take 1 tablet (3 mg total) by mouth daily. 30 tablet 2  . escitalopram (LEXAPRO) 10 MG tablet Take 1 tablet (10 mg total) by mouth daily. 90 tablet 1   No current facility-administered medications for this visit.    Medication Side Effects: None  Allergies:  Allergies  Allergen Reactions  . Penicillins Rash    Has patient had a PCN reaction causing immediate rash, facial/tongue/throat swelling, SOB or lightheadedness with hypotension: Yes Has patient had a PCN reaction causing severe rash involving mucus membranes or skin necrosis: No Has patient had a PCN reaction that required hospitalization: No Has patient had a PCN reaction occurring within the last 10 years: Yes If all of the above answers are "NO", then may proceed with Cephalosporin use.  3g Ancef administered 10/21/2017 without reaction/complic    Past Medical History:  Diagnosis Date  . Anemia 2016  . Anxiety   . Bipolar disorder ([place])   . Cancer ([place])   . Complication of anesthesia   . Depression   . Epigastric hernia   . Esophagitis    LA Class A  . GERD (gastroesophageal reflux disease)   . Hiatal hernia   . HOH (hard of hearing)   . Hypothyroidism   . Learning disability   . Obesity   . PONV (postoperative nausea and vomiting)   . Pseudoseizures   . Seizure ([place]) 2014   last seizure 2(two) years ago  . Seizures ([place])    according to echart- seizures vs pseudoseizures  . Sleep apnea    No cpap  . Thyroid disease   . Ventral hernia     Family History  Problem Relation Age of Onset  . Colon polyps Mother   . Celiac disease Mother   . Depression Mother   . Anxiety disorder Mother   . Colon polyps Father   . Celiac disease Brother   . Diabetes Sister   . Depression Sister   . Depression Brother   . CVA Brother   .  Diabetes Paternal Grandmother   . Heart disease Paternal Grandfather   . Colon polyps Paternal Aunt   . Diabetes Maternal Aunt   . Heart disease Maternal Grandfather   . Heart disease Maternal Uncle   . Heart disease Maternal Aunt   . Seizures Neg Hx     Social History   Socioeconomic History  . Marital status: Single    Spouse name: Not on file  . Number of children: 0  . Years of education: 10  . Highest education level: Not on file  Occupational History  . Not on file  Tobacco Use  . Smoking status: Never Smoker  . Smokeless tobacco: Never Used  Substance and Sexual Activity  . Alcohol use: No    Alcohol/week: 0.0 standard drinks  . Drug use:  No  . Sexual activity: Not Currently  Other Topics Concern  . Not on file  Social History Narrative   Patient lives at home with mom.    Patient does not have any children.    Patient has a 10th grade education.    Patient is single.    Patient is left handed.    Does not have a living will or HPOA- full code.   Social Determinants of Health   Financial Resource Strain:   . Difficulty of Paying Living Expenses: Not on file  Food Insecurity:   . Worried About Charity fundraiser in the Last Year: Not on file  . Ran Out of Food in the Last Year: Not on file  Transportation Needs:   . Lack of Transportation (Medical): Not on file  . Lack of Transportation (Non-Medical): Not on file  Physical Activity:   . Days of Exercise per Week: Not on file  . Minutes of Exercise per Session: Not on file  Stress:   . Feeling of Stress : Not on file  Social Connections:   . Frequency of Communication with Friends and Family: Not on file  . Frequency of Social Gatherings with Friends and Family: Not on file  . Attends Religious Services: Not on file  . Active Member of Clubs or Organizations: Not on file  . Attends Archivist Meetings: Not on file  . Marital Status: Not on file  Intimate Partner Violence:   . Fear of  Current or Ex-Partner: Not on file  . Emotionally Abused: Not on file  . Physically Abused: Not on file  . Sexually Abused: Not on file    Past Medical History, Surgical history, Social history, and Family history were reviewed and updated as appropriate.   Please see review of systems for further details on the patient's review from today.   Objective:   Physical Exam:  LMP 12/19/2014 (Exact Date)   Physical Exam Constitutional:      General: She is not in acute distress. Musculoskeletal:        General: No deformity.  Neurological:     Mental Status: She is alert and oriented to person, place, and time.     Coordination: Coordination normal.  Psychiatric:        Attention and Perception: Attention and perception normal. She does not perceive auditory or visual hallucinations.        Mood and Affect: Mood is anxious and depressed. Affect is not labile, blunt, angry or inappropriate.        Speech: Speech normal.        Behavior: Behavior normal.        Thought Content: Thought content normal. Thought content is not paranoid or delusional. Thought content does not include homicidal or suicidal ideation. Thought content does not include homicidal or suicidal plan.        Cognition and Memory: Cognition and memory normal.        Judgment: Judgment normal.     Comments: Insight fair     Lab Review:     Component Value Date/Time   NA 138 11/09/2018 1012   K 4.0 11/09/2018 1012   CL 106 11/09/2018 1012   CO2 23 11/09/2018 1012   GLUCOSE 104 (H) 11/09/2018 1012   BUN 9 11/09/2018 1012   CREATININE 0.89 11/09/2018 1012   CALCIUM 8.7 11/09/2018 1012   PROT 6.6 11/09/2018 1012   ALBUMIN 4.0 11/09/2018 1012   AST 13 11/09/2018  1012   ALT 14 11/09/2018 1012   ALKPHOS 74 11/09/2018 1012   BILITOT 0.6 11/09/2018 1012   GFRNONAA >60 10/13/2017 0901   GFRAA >60 10/13/2017 0901       Component Value Date/Time   WBC 6.1 11/09/2018 1012   RBC 3.86 (L) 11/09/2018 1012   HGB  11.2 (L) 11/09/2018 1012   HGB 12.3 11/03/2014 1100   HCT 34.9 (L) 11/09/2018 1012   HCT 35.7 11/03/2014 1100   PLT 185.0 11/09/2018 1012   MCV 90.4 11/09/2018 1012   MCH 29.8 10/13/2017 0901   MCHC 32.2 11/09/2018 1012   RDW 15.7 (H) 11/09/2018 1012   LYMPHSABS 1.4 11/09/2018 1012   MONOABS 0.3 11/09/2018 1012   EOSABS 0.3 11/09/2018 1012   BASOSABS 0.1 11/09/2018 1012    No results found for: POCLITH, LITHIUM   No results found for: PHENYTOIN, PHENOBARB, VALPROATE, CBMZ   .res Assessment: Plan:   Discussed potential benefits, risks, and side effects of increasing Rexulti to 3 mg daily to improve mood and anxiety signs symptoms since patient has experienced some improvement in mood and anxiety signs and symptoms at lower doses of Rexulti and has been tolerating Rexulti without difficulty. Pt's mother reports that they will be moving in April and may transfer care to provider closer to their new home. Discussed that records can be sent to new provider with signed information release. Mother reports that they are also transferring care to new PCP and requests that Lamictal and Topamax (that have been prescribed for h/o seizures) be refilled until she can be seen by new PCP.  Continue Lexapro 10 mg po qd for mood and anxiety.  Continue Ativan prn anxiety.  Pt to f/u in 4 weeks or sooner if clinically indicated.  Patient advised to contact office with any questions, adverse effects, or acute worsening in signs and symptoms.  Atyana was seen today for anxiety and depression.  Diagnoses and all orders for this visit:  Seizures ([place]) -     lamoTRIgine (LAMICTAL) 150 MG tablet; Take 1 tablet (150 mg total) by mouth 2 (two) times daily. -     topiramate (TOPAMAX) 100 MG tablet; TAKE 1 TABLET BY MOUTH ONCE DAILY IN THE MORNING AND 1 & 1/2 (ONE & ONE-HALF) ONCE DAILY AT BEDTIME  Episodic mood disorder ([facility]) -     Discontinue: Brexpiprazole (REXULTI) 3 MG TABS; Take 0.6667 tablets (2 mg  total) by mouth daily. -     Brexpiprazole (REXULTI) 3 MG TABS; Take 1 tablet (3 mg total) by mouth daily.  Bipolar depression ([place]) -     escitalopram (LEXAPRO) 10 MG tablet; Take 1 tablet (10 mg total) by mouth daily. -     lamoTRIgine (LAMICTAL) 150 MG tablet; Take 1 tablet (150 mg total) by mouth 2 (two) times daily. -     topiramate (TOPAMAX) 100 MG tablet; TAKE 1 TABLET BY MOUTH ONCE DAILY IN THE MORNING AND 1 & 1/2 (ONE & ONE-HALF) ONCE DAILY AT BEDTIME  Other specified anxiety disorders -     escitalopram (LEXAPRO) 10 MG tablet; Take 1 tablet (10 mg total) by mouth daily.     Please see After Visit Summary for patient specific instructions.  Future Appointments  Date Time Provider [place]  07/07/2019 10:30 AM Thayer Headings, PMHNP [facility] None  08/25/2019 10:20 AM Philemon Kingdom, MD [facility] None  12/21/2019 10:30 AM Ward Givens, NP [facility] None    No orders of the defined types were placed in  this encounter.   -------------------------------

## 2019-07-07 ENCOUNTER — Encounter: Payer: Self-pay | Admitting: Psychiatry

## 2019-07-07 ENCOUNTER — Ambulatory Visit (INDEPENDENT_AMBULATORY_CARE_PROVIDER_SITE_OTHER): Payer: Medicare HMO | Admitting: Psychiatry

## 2019-07-07 ENCOUNTER — Other Ambulatory Visit: Payer: Self-pay

## 2019-07-07 DIAGNOSIS — F418 Other specified anxiety disorders: Secondary | ICD-10-CM

## 2019-07-07 DIAGNOSIS — R69 Illness, unspecified: Secondary | ICD-10-CM | POA: Diagnosis not present

## 2019-07-07 DIAGNOSIS — F39 Unspecified mood [affective] disorder: Secondary | ICD-10-CM

## 2019-07-07 MED ORDER — LORAZEPAM 0.5 MG PO TABS: ORAL_TABLET | ORAL | 1 refills | Status: AC

## 2019-07-07 MED ORDER — REXULTI 4 MG PO TABS: 4.0000 mg | ORAL_TABLET | Freq: Every day | ORAL | 1 refills | Status: AC

## 2019-07-07 NOTE — Progress Notes (Signed)
   07/07/19 1058  Facial and Oral Movements  Muscles of Facial Expression 0  Lips and Perioral Area 0  Jaw 0  Tongue 0  Extremity Movements  Upper (arms, wrists, hands, fingers) 0  Lower (legs, knees, ankles, toes) 0  Trunk Movements  Neck, shoulders, hips 0  Overall Severity  Severity of abnormal movements (highest score from questions above) 0  Incapacitation due to abnormal movements 0  Patient's awareness of abnormal movements (rate only patient's report) 0  AIMS Total Score  AIMS Total Score 0

## 2019-07-07 NOTE — Progress Notes (Signed)
Nancy Hall [id number] 14-Jun-1970 49 y.o.  Subjective:   Patient ID:  Nancy Hall is a 49 y.o. (DOB May 01, 1970) female.  Chief Complaint:  Chief Complaint  Patient presents with  . Paranoid  . Depression  . Anxiety    HPI Nancy Hall presents to the office today for follow-up of depression and anxiety. She is accompanied by her mother. Mother reports that pt is thinking that "everybody in the house is against her." mother reports that pt takes comments people say seriously. Pt denies paranoia. Mother reports that pt tends to tell others what they should be doing- "like a mother hen." Mother reports that pt has not been wanting to go places like she normally would, or will decide at the last minute she does not want to go. Mother reports that pt continues to pick at her skin. Panic attacks have resolved. No recent chest tightness or pain. Occ sadness when she thinks about her deceased father. They report that this is not frequent and typically around holidays and anniversaries. Energy and motivation have been low. She has been walking dog some. Has been doing her chores and that it may take time to get around to it. Mother reports that they had visitors for Easter and pt interacted with them and talked and played games. Sleep has improved since move. Appetite has been good. No change in concentration. Some slight increased enjoyment in things.  Denies SI. Denies AH or VH.   They are in the process of moving. Mother reports that Emotional support dog that pt used to have has come back and is helping pt.    Past medication trials: Abilify-helpful for mood signs and symptoms. Had tremor and involuntary orofacial movements. Possible weight gain. Vraylar- Had tremor and oral facial movements Latuda-ineffective for mood signs and symptoms. No tolerability issues. Fanapt Rexulti- Effective, well tolerated. Sertraline-has taken long-term. May have been helpful for  depression. Lexapro Lamictal-prescribed for seizures by neurologist. Patient has taken long-term and effect on mood is unclear. Topamax-taken long-term and was started for seizures. Depakote-prescribed for seizures in the past. Lithium- tremor, hot flashes, worsening mood.  AIMS     Office Visit from 07/07/2019 in [place] Visit from 06/08/2019 in [place] Visit from 02/18/2019 in [place] Visit from 11/26/2018 in [place] Visit from 08/04/2018 in [place] Total Score  0  0  0  0  2    Mini-Mental     Clinical Support from 01/21/2018 in [place] from 12/19/2016 in [place] at [facility]  Total Score (max 30 points )  29  19    PHQ2-9     Video Visit from 05/28/2019 in [place] from 01/27/2019 in [place] from 01/18/2019 in [place] from 01/21/2018 in [place] from 12/19/2016 in [place] at [facility] Total Score  1  0  0  1  2  PHQ-9 Total Score  5  --  6  --  8       Review of Systems:  Review of Systems  Respiratory:       Dyspnea on exertion  Musculoskeletal: Negative for gait problem.  Neurological: Negative for tremors.  Psychiatric/Behavioral:       Please refer to HPI    Medications: I have reviewed  the patient's current medications.  Current Outpatient Medications  Medication Sig Dispense Refill  . acetaminophen (TYLENOL) 325 MG tablet Take 650 mg by mouth every 6 (six) hours as needed.    Marland Kitchen escitalopram (LEXAPRO) 10 MG tablet Take 1 tablet (10 mg total) by mouth daily. 90 tablet 1  . EUTHYROX 137 MCG tablet TAKE 1 TABLET BY MOUTH ONCE DAILY BEFORE BREAKFAST 90 tablet 1  . gabapentin (NEURONTIN) 300 MG capsule  Take 1 capsule by mouth once daily at bedtime 90 capsule 3  . ibuprofen (ADVIL) 600 MG tablet TAKE 1 TABLET BY MOUTH EVERY 8 HOURS AS NEEDED WITH FOOD FOR 2 WEEKS 30 tablet 0  . lamoTRIgine (LAMICTAL) 150 MG tablet Take 1 tablet (150 mg total) by mouth 2 (two) times daily. 180 tablet 0  . LORazepam (ATIVAN) 0.5 MG tablet Take 1/2-1 tab po q 6 hours prn 60 tablet 1  . omeprazole (PRILOSEC) 40 MG capsule Take 1 capsule (40 mg total) by mouth daily. 90 capsule 3  . topiramate (TOPAMAX) 100 MG tablet TAKE 1 TABLET BY MOUTH ONCE DAILY IN THE MORNING AND 1 & 1/2 (ONE & ONE-HALF) ONCE DAILY AT BEDTIME 225 tablet 0  . Brexpiprazole (REXULTI) 4 MG TABS Take 4 mg by mouth daily. 30 tablet 1   No current facility-administered medications for this visit.    Medication Side Effects: None  Allergies:  Allergies  Allergen Reactions  . Penicillins Rash    Has patient had a PCN reaction causing immediate rash, facial/tongue/throat swelling, SOB or lightheadedness with hypotension: Yes Has patient had a PCN reaction causing severe rash involving mucus membranes or skin necrosis: No Has patient had a PCN reaction that required hospitalization: No Has patient had a PCN reaction occurring within the last 10 years: Yes If all of the above answers are "NO", then may proceed with Cephalosporin use.  3g Ancef administered 10/21/2017 without reaction/complic    Past Medical History:  Diagnosis Date  . Anemia 2016  . Anxiety   . Bipolar disorder ([place])   . Cancer ([place])   . Complication of anesthesia   . Depression   . Epigastric hernia   . Esophagitis    LA Class A  . GERD (gastroesophageal reflux disease)   . Hiatal hernia   . HOH (hard of hearing)   . Hypothyroidism   . Learning disability   . Obesity   . PONV (postoperative nausea and vomiting)   . Pseudoseizures   . Seizure ([place]) 2014   last seizure 2(two) years ago  . Seizures ([place])    according to echart- seizures vs pseudoseizures  .  Sleep apnea    No cpap  . Thyroid disease   . Ventral hernia     Family History  Problem Relation Age of Onset  . Colon polyps Mother   . Celiac disease Mother   . Depression Mother   . Anxiety disorder Mother   . Colon polyps Father   . Celiac disease Brother   . Diabetes Sister   . Depression Sister   . Depression Brother   . CVA Brother   . Diabetes Paternal Grandmother   . Heart disease Paternal Grandfather   . Colon polyps Paternal Aunt   . Diabetes Maternal Aunt   . Heart disease Maternal Grandfather   . Heart disease Maternal Uncle   . Heart disease Maternal Aunt   . Seizures Neg Hx     Social History   Socioeconomic History  . Marital  status: Single    Spouse name: Not on file  . Number of children: 0  . Years of education: 10  . Highest education level: Not on file  Occupational History  . Not on file  Tobacco Use  . Smoking status: Never Smoker  . Smokeless tobacco: Never Used  Substance and Sexual Activity  . Alcohol use: No    Alcohol/week: 0.0 standard drinks  . Drug use: No  . Sexual activity: Not Currently  Other Topics Concern  . Not on file  Social History Narrative   Patient lives at home with mom.    Patient does not have any children.    Patient has a 10th grade education.    Patient is single.    Patient is left handed.    Does not have a living will or HPOA- full code.   Social Determinants of Health   Financial Resource Strain:   . Difficulty of Paying Living Expenses:   Food Insecurity:   . Worried About Charity fundraiser in the Last Year:   . Arboriculturist in the Last Year:   Transportation Needs:   . Film/video editor (Medical):   Marland Kitchen Lack of Transportation (Non-Medical):   Physical Activity:   . Days of Exercise per Week:   . Minutes of Exercise per Session:   Stress:   . Feeling of Stress :   Social Connections:   . Frequency of Communication with Friends and Family:   . Frequency of Social Gatherings with  Friends and Family:   . Attends Religious Services:   . Active Member of Clubs or Organizations:   . Attends Archivist Meetings:   Marland Kitchen Marital Status:   Intimate Partner Violence:   . Fear of Current or Ex-Partner:   . Emotionally Abused:   Marland Kitchen Physically Abused:   . Sexually Abused:     Past Medical History, Surgical history, Social history, and Family history were reviewed and updated as appropriate.   Please see review of systems for further details on the patient's review from today.   Objective:   Physical Exam:  LMP 12/19/2014 (Exact Date)   Physical Exam Constitutional:      General: She is not in acute distress. Musculoskeletal:        General: No deformity.  Neurological:     Mental Status: She is alert and oriented to person, place, and time.     Coordination: Coordination normal.  Psychiatric:        Attention and Perception: Attention and perception normal. She does not perceive auditory or visual hallucinations.        Mood and Affect: Mood is anxious. Affect is blunt. Affect is not labile, angry or inappropriate.        Speech: Speech is delayed.        Behavior: Behavior normal.        Thought Content: Thought content is paranoid. Thought content is not delusional. Thought content does not include homicidal or suicidal ideation. Thought content does not include homicidal or suicidal plan.        Cognition and Memory: Cognition and memory normal.        Judgment: Judgment normal.     Comments: Insight limited Speech is slowed Mood presents as less depressed compared to last exam.     Lab Review:     Component Value Date/Time   NA 138 11/09/2018 1012   K 4.0 11/09/2018 1012   CL 106  11/09/2018 1012   CO2 23 11/09/2018 1012   GLUCOSE 104 (H) 11/09/2018 1012   BUN 9 11/09/2018 1012   CREATININE 0.89 11/09/2018 1012   CALCIUM 8.7 11/09/2018 1012   PROT 6.6 11/09/2018 1012   ALBUMIN 4.0 11/09/2018 1012   AST 13 11/09/2018 1012   ALT 14  11/09/2018 1012   ALKPHOS 74 11/09/2018 1012   BILITOT 0.6 11/09/2018 1012   GFRNONAA >60 10/13/2017 0901   GFRAA >60 10/13/2017 0901       Component Value Date/Time   WBC 6.1 11/09/2018 1012   RBC 3.86 (L) 11/09/2018 1012   HGB 11.2 (L) 11/09/2018 1012   HGB 12.3 11/03/2014 1100   HCT 34.9 (L) 11/09/2018 1012   HCT 35.7 11/03/2014 1100   PLT 185.0 11/09/2018 1012   MCV 90.4 11/09/2018 1012   MCH 29.8 10/13/2017 0901   MCHC 32.2 11/09/2018 1012   RDW 15.7 (H) 11/09/2018 1012   LYMPHSABS 1.4 11/09/2018 1012   MONOABS 0.3 11/09/2018 1012   EOSABS 0.3 11/09/2018 1012   BASOSABS 0.1 11/09/2018 1012    No results found for: POCLITH, LITHIUM   No results found for: PHENYTOIN, PHENOBARB, VALPROATE, CBMZ   .res Assessment: Plan:   Will increase Rexulti to 4 mg daily since lower doses have been somewhat of active patient's mood and anxiety signs and symptoms, however patient continues to have some suspiciousness and paranoia and higher dose Rexulti may be able to improve paranoia. Will continue all other medications as prescribed. Continue Lexapro 10 mg daily for anxiety depression. Continue Ativan 0.5 mg 1/2-1 tab every 6 hours as needed for anxiety. Patient's mother has scheduled patient with a new primary care provider and plans to request referral at that visit for a psychiatry provider in their office.  Information release obtained for medical practice of new PCP. Will tentatively schedule follow-up with this provider in 6 weeks.  Discussed that this appointment can be canceled if patient is able to establish care with new psychiatric provider in the near future. Patient advised to contact office with any questions, adverse effects, or acute worsening in signs and symptoms.  Nancy Hall was seen today for paranoid, depression and anxiety.  Diagnoses and all orders for this visit:  Episodic mood disorder (HCC) -     Brexpiprazole (REXULTI) 4 MG TABS; Take 4 mg by mouth  daily.  Other specified anxiety disorders -     Brexpiprazole (REXULTI) 4 MG TABS; Take 4 mg by mouth daily. -     LORazepam (ATIVAN) 0.5 MG tablet; Take 1/2-1 tab po q 6 hours prn     Please see After Visit Summary for patient specific instructions.  Future Appointments  Date Time Provider [place]  08/18/2019 11:00 AM Thayer Headings, PMHNP [facility] None  08/25/2019 10:20 AM Philemon Kingdom, MD [facility] None  12/21/2019 10:30 AM Ward Givens, NP [facility] None    No orders of the defined types were placed in this encounter.   -------------------------------

## 2019-07-26 DIAGNOSIS — G4739 Other sleep apnea: Secondary | ICD-10-CM | POA: Diagnosis not present

## 2019-07-26 DIAGNOSIS — F411 Generalized anxiety disorder: Secondary | ICD-10-CM | POA: Insufficient documentation

## 2019-07-26 DIAGNOSIS — R5383 Other fatigue: Secondary | ICD-10-CM | POA: Diagnosis not present

## 2019-07-26 DIAGNOSIS — R69 Illness, unspecified: Secondary | ICD-10-CM | POA: Diagnosis not present

## 2019-07-26 DIAGNOSIS — G629 Polyneuropathy, unspecified: Secondary | ICD-10-CM | POA: Insufficient documentation

## 2019-07-26 DIAGNOSIS — E038 Other specified hypothyroidism: Secondary | ICD-10-CM | POA: Diagnosis not present

## 2019-07-26 DIAGNOSIS — G40309 Generalized idiopathic epilepsy and epileptic syndromes, not intractable, without status epilepticus: Secondary | ICD-10-CM | POA: Diagnosis not present

## 2019-07-26 DIAGNOSIS — R0600 Dyspnea, unspecified: Secondary | ICD-10-CM | POA: Diagnosis not present

## 2019-08-18 ENCOUNTER — Ambulatory Visit: Payer: Medicare HMO | Admitting: Psychiatry

## 2019-08-18 DIAGNOSIS — G4719 Other hypersomnia: Secondary | ICD-10-CM | POA: Diagnosis not present

## 2019-08-18 DIAGNOSIS — G40309 Generalized idiopathic epilepsy and epileptic syndromes, not intractable, without status epilepticus: Secondary | ICD-10-CM | POA: Diagnosis not present

## 2019-08-18 DIAGNOSIS — R0683 Snoring: Secondary | ICD-10-CM | POA: Diagnosis not present

## 2019-08-18 DIAGNOSIS — G2581 Restless legs syndrome: Secondary | ICD-10-CM | POA: Diagnosis not present

## 2019-08-23 ENCOUNTER — Other Ambulatory Visit: Payer: Self-pay

## 2019-08-25 ENCOUNTER — Encounter: Payer: Self-pay | Admitting: Internal Medicine

## 2019-08-25 ENCOUNTER — Ambulatory Visit (INDEPENDENT_AMBULATORY_CARE_PROVIDER_SITE_OTHER): Payer: Medicare HMO | Admitting: Internal Medicine

## 2019-08-25 ENCOUNTER — Other Ambulatory Visit: Payer: Self-pay

## 2019-08-25 VITALS — BP 128/80 | HR 87 | Ht 66.0 in | Wt 305.0 lb

## 2019-08-25 DIAGNOSIS — E039 Hypothyroidism, unspecified: Secondary | ICD-10-CM

## 2019-08-25 LAB — T4, FREE: Free T4: 0.86 ng/dL (ref 0.60–1.60)

## 2019-08-25 LAB — TSH: TSH: 3.61 u[IU]/mL (ref 0.35–4.50)

## 2019-08-25 MED ORDER — LEVOTHYROXINE SODIUM 137 MCG PO TABS: ORAL_TABLET | ORAL | 3 refills | Status: AC

## 2019-08-25 NOTE — Patient Instructions (Signed)
Please continue Levothyroxine 137 mcg daily.  Take the thyroid hormone every day, with water, at least 30 minutes before breakfast, separated by at least 4 hours from: - acid reflux medications - calcium - iron - multivitamins  Please stop at the lab.  Please come back for a follow-up appointment in 1 year. 

## 2019-08-25 NOTE — Progress Notes (Signed)
Patient ID: Nancy Hall, female   DOB: Jun 12, 1970, 49 y.o.   MRN: [id number]  This visit occurred during the SARS-CoV-2 public health emergency.  Safety protocols were in place, including screening questions prior to the visit, additional usage of staff PPE, and extensive cleaning of exam room while observing appropriate contact time as indicated for disinfecting solutions.   HPI  Nancy Hall is a 49 y.o.-year-old female, returning for f/u for uncontrolled hypothyroidism. Last visit 6 months ago.  PCP: Dr. Arnette Norris.  Reviewed history: Pt. has been dx with hypothyroidism in 709 (49 years old); she is on Levothyroxine 75 >> then started LT5 100 mcg + Cytomel 25  Daily. We have been trying to reduce her Cytomel >>  LT4 125 mcg + LT3 5 mcg daily >> on LT4 137 mcg daily.  She takes her levothyroxine: - in am, around 6 AM - fasting - at least 30 min from b'fast - no Ca, Fe, MVI, + PPIs (omeprazole, at night) - not on Biotin  Reviewed her TFTs: Lab Results  Component Value Date   TSH 2.74 02/23/2019   TSH 4.79 (H) 11/09/2018   TSH 2.96 02/24/2018   TSH 5.49 (H) 12/29/2017   TSH 3.86 07/29/2017   TSH 2.49 06/02/2017   TSH 18.41 (H) 04/03/2017   TSH 4.99 (H) 02/24/2017   TSH 2.14 11/05/2016   TSH 0.81 08/23/2016   FREET4 1.13 02/23/2019   FREET4 1.04 11/09/2018   FREET4 0.94 02/24/2018   FREET4 0.94 12/29/2017   FREET4 0.81 06/02/2017   FREET4 0.56 (L) 04/03/2017   FREET4 1.10 02/24/2017   FREET4 1.07 11/05/2016   FREET4 0.92 08/23/2016   FREET4 0.74 02/01/2016    Pt denies: - feeling nodules in neck - hoarseness - choking - SOB with lying down She has occasional dysphagia, which is chronic.  She has + FH of thyroid disorders in: MGM. No FH of thyroid cancer. No h/o radiation tx to head or neck.  No seaweed or kelp. No recent contrast studies. No herbal supplements. No Biotin use. No recent steroids use.   She has a history of bipolar disease (seeing a counselor and  psychiatrist), depression, GERD, h/o fibroids, h/o hysterectomy, h/o ear infections, history of seizures.  At last visit, she was telling me that her therapist was thinking about changing her psychotropic medications to Abilify, to also help with weight loss. She tried Abilify >> mouth tremors >> changed to [place] >> more depressed >> started Vraylar before last visit, now Rexulti, Lexapro, Topamax, continues Lamictal.  She continues to have hot flashes-   Improved  She has prediabetes, followed by PCP. Lab Results  Component Value Date   HGBA1C 6.1 11/09/2018   HGBA1C 5.9 12/29/2017   HGBA1C 5.9 07/10/2016   HGBA1C 5.7 01/01/2016   HGBA1C 5.5 10/27/2013   HGBA1C 5.6 12/31/2012    ROS: Constitutional: + weight gain/no weight loss, no fatigue, no subjective hyperthermia, no subjective hypothermia Eyes: no blurry vision, no xerophthalmia ENT: no sore throat, + see HPI Cardiovascular: no CP/no SOB/no palpitations/no leg swelling Respiratory: no cough/no SOB/no wheezing Gastrointestinal: no N/no V/no D/no C/no acid reflux Musculoskeletal: no muscle aches/no joint aches Skin: no rashes, no hair loss Neurological: no tremors/no numbness/no tingling/no dizziness  I reviewed pt's medications, allergies, PMH, social hx, family hx, and changes were documented in the history of present illness. Otherwise, unchanged from my initial visit note.  Past Medical History:  Diagnosis Date  . Anemia 2016  . Anxiety   .  Bipolar disorder ([place])   . Cancer ([place])   . Complication of anesthesia   . Depression   . Epigastric hernia   . Esophagitis    LA Class A  . GERD (gastroesophageal reflux disease)   . Hiatal hernia   . HOH (hard of hearing)   . Hypothyroidism   . Learning disability   . Obesity   . PONV (postoperative nausea and vomiting)   . Pseudoseizures   . Seizure ([place]) 2014   last seizure 2(two) years ago  . Seizures ([place])    according to echart- seizures vs pseudoseizures  .  Sleep apnea    No cpap  . Thyroid disease   . Ventral hernia    Past Surgical History:  Procedure Laterality Date  . ABDOMINAL HYSTERECTOMY N/A 01/09/2015   Procedure: HYSTERECTOMY ABDOMINAL/BILATERAL SALPINGECTOMY;  Surgeon: Rubie Maid, MD;  Location: [facility];  Service: Gynecology;  Laterality: N/A;  . DILATION AND CURETTAGE OF UTERUS    . HYSTEROSCOPY WITH D & C N/A 12/19/2014   Procedure: DILATATION AND CURETTAGE /HYSTEROSCOPY;  Surgeon: Rubie Maid, MD;  Location: [facility];  Service: Gynecology;  Laterality: N/A;  . INNER EAR SURGERY Bilateral 1994   poor historian  . INSERTION OF MESH N/A 10/21/2017   Procedure: INSERTION OF MESH;  Surgeon: Donnie Mesa, MD;  Location: [place];  Service: General;  Laterality: N/A;  . TONSILLECTOMY    . VENTRAL HERNIA REPAIR N/A 10/21/2017   Procedure: OPEN REPAIR EPIGASTIC VENTRAL HERNIA WITH  MESH ERAS PATHWAY;  Surgeon: Donnie Mesa, MD;  Location: [place];  Service: General;  Laterality: N/A;   Social History   Social History  . Marital Status: Single    Spouse Name: N/A  . Number of Children: 0  . Years of Education: 10   Social History Main Topics  . Smoking status: Never Smoker   . Smokeless tobacco: Never Used  . Alcohol Use: No  . Drug Use: No   Social History Narrative   Patient lives at home with mom.    Patient does not have any children.    Patient has a 10th grade education.    Patient is single.    Patient is left handed.    Does not have a living will or HPOA- full code.   Current Outpatient Medications on File Prior to Visit  Medication Sig Dispense Refill  . acetaminophen (TYLENOL) 325 MG tablet Take 650 mg by mouth every 6 (six) hours as needed.    . Brexpiprazole (REXULTI) 4 MG TABS Take 4 mg by mouth daily. 30 tablet 1  . escitalopram (LEXAPRO) 10 MG tablet Take 1 tablet (10 mg total) by mouth daily. 90 tablet 1  . EUTHYROX 137 MCG tablet TAKE 1 TABLET BY MOUTH ONCE DAILY BEFORE BREAKFAST 90 tablet 1  .  gabapentin (NEURONTIN) 300 MG capsule Take 1 capsule by mouth once daily at bedtime 90 capsule 3  . ibuprofen (ADVIL) 600 MG tablet TAKE 1 TABLET BY MOUTH EVERY 8 HOURS AS NEEDED WITH FOOD FOR 2 WEEKS 30 tablet 0  . lamoTRIgine (LAMICTAL) 150 MG tablet Take 1 tablet (150 mg total) by mouth 2 (two) times daily. 180 tablet 0  . LORazepam (ATIVAN) 0.5 MG tablet Take 1/2-1 tab po q 6 hours prn 60 tablet 1  . omeprazole (PRILOSEC) 40 MG capsule Take 1 capsule (40 mg total) by mouth daily. 90 capsule 3  . topiramate (TOPAMAX) 100 MG tablet TAKE 1 TABLET BY MOUTH ONCE DAILY  IN THE MORNING AND 1 & 1/2 (ONE & ONE-HALF) ONCE DAILY AT BEDTIME 225 tablet 0   No current facility-administered medications on file prior to visit.   Allergies  Allergen Reactions  . Penicillins Rash    Has patient had a PCN reaction causing immediate rash, facial/tongue/throat swelling, SOB or lightheadedness with hypotension: Yes Has patient had a PCN reaction causing severe rash involving mucus membranes or skin necrosis: No Has patient had a PCN reaction that required hospitalization: No Has patient had a PCN reaction occurring within the last 10 years: Yes If all of the above answers are "NO", then may proceed with Cephalosporin use.  3g Ancef administered 10/21/2017 without reaction/complic   Family History  Problem Relation Age of Onset  . Colon polyps Mother   . Celiac disease Mother   . Depression Mother   . Anxiety disorder Mother   . Colon polyps Father   . Celiac disease Brother   . Diabetes Sister   . Depression Sister   . Depression Brother   . CVA Brother   . Diabetes Paternal Grandmother   . Heart disease Paternal Grandfather   . Colon polyps Paternal Aunt   . Diabetes Maternal Aunt   . Heart disease Maternal Grandfather   . Heart disease Maternal Uncle   . Heart disease Maternal Aunt   . Seizures Neg Hx    PE: BP 128/80   Pulse 87   Ht 5\' 6"  (1.676 m)   Wt (!) 305 lb (138.3 kg)   LMP  12/19/2014 (Exact Date)   SpO2 97%   BMI 49.23 kg/m  Wt Readings from Last 3 Encounters:  08/25/19 (!) 305 lb (138.3 kg)  02/23/19 294 lb (133.4 kg)  01/18/19 292 lb (132.5 kg)   Constitutional: overweight, in NAD Eyes: PERRLA, EOMI, no exophthalmos ENT: moist mucous membranes, no thyromegaly, no cervical lymphadenopathy Cardiovascular: RRR, No MRG Respiratory: CTA B Gastrointestinal: abdomen soft, NT, ND, BS+ Musculoskeletal: no deformities, strength intact in all 4 Skin: moist, warm, no rashes Neurological: no tremor with outstretched hands, DTR normal in all 4  ASSESSMENT: 1. Hypothyroidism  2. Obesity class III  3.  Hot flashes  PLAN:  1. Patient with uncontrolled, hypothyroidism, previously on LT4 and LT3.  We have been gradually decreasing her LT3 dose and we stopped completely summer 2018.  Her head tremors improved after stopping LT3.  Also, her TFTs normalized, but they were still fluctuating afterwards. - latest thyroid labs reviewed with pt >> normal: Lab Results  Component Value Date   TSH 2.74 02/23/2019   - she continues on LT4 137 mcg daily - pt feels good on this dose. - we discussed about taking the thyroid hormone every day, with water, >30 minutes before breakfast, separated by >4 hours from acid reflux medications, calcium, iron, multivitamins. Pt. is taking it correctly. - will check thyroid tests today: TSH and fT4 - If labs are abnormal, she will need to return for repeat TFTs in 1.5 months  2. Obesity class III -She gained 11 pounds since last visit -In the past, I recommended a: Weight loss center and I referred her to Dr. Adair Patter. She did not have the appt yet. I suggested this again >> she will need to talk with her mother first and let me know  Needs refills.  Component     Latest Ref Rng & Units 08/25/2019  TSH     0.35 - 4.50 uIU/mL 3.61  T4,Free(Direct)     0.60 -  1.60 ng/dL 0.86  Normal TFTs.  Philemon Kingdom, MD PhD [facility]  Endocrinology

## 2019-08-26 DIAGNOSIS — E785 Hyperlipidemia, unspecified: Secondary | ICD-10-CM | POA: Diagnosis not present

## 2019-08-26 DIAGNOSIS — R5383 Other fatigue: Secondary | ICD-10-CM | POA: Diagnosis not present

## 2019-08-26 DIAGNOSIS — E038 Other specified hypothyroidism: Secondary | ICD-10-CM | POA: Diagnosis not present

## 2019-09-01 DIAGNOSIS — F411 Generalized anxiety disorder: Secondary | ICD-10-CM | POA: Diagnosis not present

## 2019-09-01 DIAGNOSIS — N289 Disorder of kidney and ureter, unspecified: Secondary | ICD-10-CM | POA: Diagnosis not present

## 2019-09-01 DIAGNOSIS — E7439 Other disorders of intestinal carbohydrate absorption: Secondary | ICD-10-CM | POA: Diagnosis not present

## 2019-09-01 DIAGNOSIS — R69 Illness, unspecified: Secondary | ICD-10-CM | POA: Diagnosis not present

## 2019-09-01 DIAGNOSIS — F39 Unspecified mood [affective] disorder: Secondary | ICD-10-CM | POA: Diagnosis not present

## 2019-09-01 DIAGNOSIS — E038 Other specified hypothyroidism: Secondary | ICD-10-CM | POA: Diagnosis not present

## 2019-09-01 DIAGNOSIS — D509 Iron deficiency anemia, unspecified: Secondary | ICD-10-CM | POA: Diagnosis not present

## 2019-09-01 DIAGNOSIS — Z0000 Encounter for general adult medical examination without abnormal findings: Secondary | ICD-10-CM | POA: Diagnosis not present

## 2019-09-01 DIAGNOSIS — E785 Hyperlipidemia, unspecified: Secondary | ICD-10-CM | POA: Diagnosis not present

## 2019-09-01 DIAGNOSIS — E559 Vitamin D deficiency, unspecified: Secondary | ICD-10-CM | POA: Diagnosis not present

## 2019-09-15 DIAGNOSIS — R69 Illness, unspecified: Secondary | ICD-10-CM | POA: Diagnosis not present

## 2019-09-15 DIAGNOSIS — F410 Panic disorder [episodic paroxysmal anxiety] without agoraphobia: Secondary | ICD-10-CM | POA: Diagnosis not present

## 2019-09-15 DIAGNOSIS — G40309 Generalized idiopathic epilepsy and epileptic syndromes, not intractable, without status epilepticus: Secondary | ICD-10-CM | POA: Diagnosis not present

## 2019-09-15 DIAGNOSIS — R569 Unspecified convulsions: Secondary | ICD-10-CM | POA: Diagnosis not present

## 2019-09-20 DIAGNOSIS — R569 Unspecified convulsions: Secondary | ICD-10-CM | POA: Diagnosis not present

## 2019-09-20 DIAGNOSIS — G40309 Generalized idiopathic epilepsy and epileptic syndromes, not intractable, without status epilepticus: Secondary | ICD-10-CM | POA: Diagnosis not present

## 2019-09-22 DIAGNOSIS — F411 Generalized anxiety disorder: Secondary | ICD-10-CM | POA: Diagnosis not present

## 2019-09-22 DIAGNOSIS — R69 Illness, unspecified: Secondary | ICD-10-CM | POA: Diagnosis not present

## 2019-09-22 DIAGNOSIS — F39 Unspecified mood [affective] disorder: Secondary | ICD-10-CM | POA: Diagnosis not present

## 2019-09-23 DIAGNOSIS — G40309 Generalized idiopathic epilepsy and epileptic syndromes, not intractable, without status epilepticus: Secondary | ICD-10-CM | POA: Diagnosis not present

## 2019-09-23 DIAGNOSIS — R569 Unspecified convulsions: Secondary | ICD-10-CM | POA: Diagnosis not present

## 2019-09-30 DIAGNOSIS — H6991 Unspecified Eustachian tube disorder, right ear: Secondary | ICD-10-CM | POA: Insufficient documentation

## 2019-09-30 DIAGNOSIS — H663X1 Other chronic suppurative otitis media, right ear: Secondary | ICD-10-CM | POA: Diagnosis not present

## 2019-09-30 DIAGNOSIS — H6981 Other specified disorders of Eustachian tube, right ear: Secondary | ICD-10-CM | POA: Diagnosis not present

## 2019-09-30 DIAGNOSIS — H7291 Unspecified perforation of tympanic membrane, right ear: Secondary | ICD-10-CM | POA: Diagnosis not present

## 2019-09-30 DIAGNOSIS — H906 Mixed conductive and sensorineural hearing loss, bilateral: Secondary | ICD-10-CM | POA: Diagnosis not present

## 2019-09-30 DIAGNOSIS — H6091 Unspecified otitis externa, right ear: Secondary | ICD-10-CM | POA: Diagnosis not present

## 2019-09-30 DIAGNOSIS — H903 Sensorineural hearing loss, bilateral: Secondary | ICD-10-CM | POA: Diagnosis not present

## 2019-10-21 DIAGNOSIS — H6091 Unspecified otitis externa, right ear: Secondary | ICD-10-CM | POA: Diagnosis not present

## 2019-10-21 DIAGNOSIS — H7291 Unspecified perforation of tympanic membrane, right ear: Secondary | ICD-10-CM | POA: Insufficient documentation

## 2019-10-21 DIAGNOSIS — Z79899 Other long term (current) drug therapy: Secondary | ICD-10-CM | POA: Diagnosis not present

## 2019-10-21 DIAGNOSIS — H6981 Other specified disorders of Eustachian tube, right ear: Secondary | ICD-10-CM | POA: Diagnosis not present

## 2019-10-21 DIAGNOSIS — H906 Mixed conductive and sensorineural hearing loss, bilateral: Secondary | ICD-10-CM | POA: Diagnosis not present

## 2019-10-21 DIAGNOSIS — H6991 Unspecified Eustachian tube disorder, right ear: Secondary | ICD-10-CM | POA: Diagnosis not present

## 2019-10-21 DIAGNOSIS — H9211 Otorrhea, right ear: Secondary | ICD-10-CM | POA: Diagnosis not present

## 2019-10-21 DIAGNOSIS — H663X1 Other chronic suppurative otitis media, right ear: Secondary | ICD-10-CM | POA: Diagnosis not present

## 2019-10-21 DIAGNOSIS — H6691 Otitis media, unspecified, right ear: Secondary | ICD-10-CM | POA: Diagnosis not present

## 2019-10-21 DIAGNOSIS — H903 Sensorineural hearing loss, bilateral: Secondary | ICD-10-CM | POA: Diagnosis not present

## 2019-11-08 DIAGNOSIS — G4739 Other sleep apnea: Secondary | ICD-10-CM | POA: Diagnosis not present

## 2019-11-08 DIAGNOSIS — R69 Illness, unspecified: Secondary | ICD-10-CM | POA: Diagnosis not present

## 2019-11-08 DIAGNOSIS — F312 Bipolar disorder, current episode manic severe with psychotic features: Secondary | ICD-10-CM | POA: Diagnosis not present

## 2019-11-08 DIAGNOSIS — F39 Unspecified mood [affective] disorder: Secondary | ICD-10-CM | POA: Diagnosis not present

## 2019-11-23 DIAGNOSIS — H6691 Otitis media, unspecified, right ear: Secondary | ICD-10-CM | POA: Diagnosis not present

## 2019-11-23 DIAGNOSIS — H7291 Unspecified perforation of tympanic membrane, right ear: Secondary | ICD-10-CM | POA: Diagnosis not present

## 2019-11-24 DIAGNOSIS — R0683 Snoring: Secondary | ICD-10-CM | POA: Diagnosis not present

## 2019-11-24 DIAGNOSIS — G4719 Other hypersomnia: Secondary | ICD-10-CM | POA: Diagnosis not present

## 2019-11-24 DIAGNOSIS — G4733 Obstructive sleep apnea (adult) (pediatric): Secondary | ICD-10-CM | POA: Diagnosis not present

## 2019-11-26 DIAGNOSIS — H6981 Other specified disorders of Eustachian tube, right ear: Secondary | ICD-10-CM | POA: Diagnosis not present

## 2019-11-26 DIAGNOSIS — H6091 Unspecified otitis externa, right ear: Secondary | ICD-10-CM | POA: Diagnosis not present

## 2019-11-26 DIAGNOSIS — Z8619 Personal history of other infectious and parasitic diseases: Secondary | ICD-10-CM | POA: Diagnosis not present

## 2019-11-26 DIAGNOSIS — H7201 Central perforation of tympanic membrane, right ear: Secondary | ICD-10-CM | POA: Insufficient documentation

## 2019-11-26 DIAGNOSIS — R569 Unspecified convulsions: Secondary | ICD-10-CM | POA: Diagnosis not present

## 2019-11-26 DIAGNOSIS — H906 Mixed conductive and sensorineural hearing loss, bilateral: Secondary | ICD-10-CM | POA: Diagnosis not present

## 2019-11-29 DIAGNOSIS — G4733 Obstructive sleep apnea (adult) (pediatric): Secondary | ICD-10-CM | POA: Diagnosis not present

## 2019-11-29 DIAGNOSIS — G40309 Generalized idiopathic epilepsy and epileptic syndromes, not intractable, without status epilepticus: Secondary | ICD-10-CM | POA: Diagnosis not present

## 2019-12-20 DIAGNOSIS — F411 Generalized anxiety disorder: Secondary | ICD-10-CM | POA: Diagnosis not present

## 2019-12-20 DIAGNOSIS — R69 Illness, unspecified: Secondary | ICD-10-CM | POA: Diagnosis not present

## 2019-12-20 DIAGNOSIS — F312 Bipolar disorder, current episode manic severe with psychotic features: Secondary | ICD-10-CM | POA: Diagnosis not present

## 2019-12-21 ENCOUNTER — Ambulatory Visit: Payer: Self-pay | Admitting: Adult Health

## 2020-01-03 DIAGNOSIS — D509 Iron deficiency anemia, unspecified: Secondary | ICD-10-CM | POA: Diagnosis not present

## 2020-01-03 DIAGNOSIS — E7439 Other disorders of intestinal carbohydrate absorption: Secondary | ICD-10-CM | POA: Diagnosis not present

## 2020-01-03 DIAGNOSIS — E559 Vitamin D deficiency, unspecified: Secondary | ICD-10-CM | POA: Diagnosis not present

## 2020-01-03 DIAGNOSIS — E785 Hyperlipidemia, unspecified: Secondary | ICD-10-CM | POA: Diagnosis not present

## 2020-01-07 DIAGNOSIS — D509 Iron deficiency anemia, unspecified: Secondary | ICD-10-CM | POA: Diagnosis not present

## 2020-01-10 DIAGNOSIS — E559 Vitamin D deficiency, unspecified: Secondary | ICD-10-CM | POA: Insufficient documentation

## 2020-01-10 DIAGNOSIS — E038 Other specified hypothyroidism: Secondary | ICD-10-CM | POA: Diagnosis not present

## 2020-01-10 DIAGNOSIS — E538 Deficiency of other specified B group vitamins: Secondary | ICD-10-CM | POA: Insufficient documentation

## 2020-01-10 DIAGNOSIS — R69 Illness, unspecified: Secondary | ICD-10-CM | POA: Diagnosis not present

## 2020-01-10 DIAGNOSIS — L989 Disorder of the skin and subcutaneous tissue, unspecified: Secondary | ICD-10-CM | POA: Diagnosis not present

## 2020-01-10 DIAGNOSIS — E785 Hyperlipidemia, unspecified: Secondary | ICD-10-CM | POA: Diagnosis not present

## 2020-01-10 DIAGNOSIS — R195 Other fecal abnormalities: Secondary | ICD-10-CM | POA: Diagnosis not present

## 2020-01-10 DIAGNOSIS — F411 Generalized anxiety disorder: Secondary | ICD-10-CM | POA: Diagnosis not present

## 2020-01-10 DIAGNOSIS — Z23 Encounter for immunization: Secondary | ICD-10-CM | POA: Diagnosis not present

## 2020-01-24 DIAGNOSIS — F411 Generalized anxiety disorder: Secondary | ICD-10-CM | POA: Diagnosis not present

## 2020-01-24 DIAGNOSIS — Z6841 Body Mass Index (BMI) 40.0 and over, adult: Secondary | ICD-10-CM | POA: Diagnosis not present

## 2020-01-24 DIAGNOSIS — F312 Bipolar disorder, current episode manic severe with psychotic features: Secondary | ICD-10-CM | POA: Diagnosis not present

## 2020-01-24 DIAGNOSIS — G40309 Generalized idiopathic epilepsy and epileptic syndromes, not intractable, without status epilepticus: Secondary | ICD-10-CM | POA: Diagnosis not present

## 2020-01-24 DIAGNOSIS — R69 Illness, unspecified: Secondary | ICD-10-CM | POA: Diagnosis not present

## 2020-01-27 DIAGNOSIS — L309 Dermatitis, unspecified: Secondary | ICD-10-CM | POA: Diagnosis not present

## 2020-01-27 DIAGNOSIS — R195 Other fecal abnormalities: Secondary | ICD-10-CM | POA: Diagnosis not present

## 2020-01-27 DIAGNOSIS — D509 Iron deficiency anemia, unspecified: Secondary | ICD-10-CM | POA: Diagnosis not present

## 2020-01-27 DIAGNOSIS — R1310 Dysphagia, unspecified: Secondary | ICD-10-CM | POA: Diagnosis not present

## 2020-02-01 DIAGNOSIS — L299 Pruritus, unspecified: Secondary | ICD-10-CM | POA: Diagnosis not present

## 2020-02-01 DIAGNOSIS — L281 Prurigo nodularis: Secondary | ICD-10-CM | POA: Diagnosis not present

## 2020-02-29 DIAGNOSIS — D509 Iron deficiency anemia, unspecified: Secondary | ICD-10-CM | POA: Diagnosis not present

## 2020-02-29 DIAGNOSIS — K449 Diaphragmatic hernia without obstruction or gangrene: Secondary | ICD-10-CM | POA: Diagnosis not present

## 2020-02-29 DIAGNOSIS — R1310 Dysphagia, unspecified: Secondary | ICD-10-CM | POA: Diagnosis not present

## 2020-02-29 DIAGNOSIS — K635 Polyp of colon: Secondary | ICD-10-CM | POA: Diagnosis not present

## 2020-02-29 DIAGNOSIS — D120 Benign neoplasm of cecum: Secondary | ICD-10-CM | POA: Diagnosis not present

## 2020-02-29 DIAGNOSIS — R69 Illness, unspecified: Secondary | ICD-10-CM | POA: Diagnosis not present

## 2020-02-29 DIAGNOSIS — K648 Other hemorrhoids: Secondary | ICD-10-CM | POA: Diagnosis not present

## 2020-03-07 DIAGNOSIS — L299 Pruritus, unspecified: Secondary | ICD-10-CM | POA: Diagnosis not present

## 2020-03-07 DIAGNOSIS — L281 Prurigo nodularis: Secondary | ICD-10-CM | POA: Diagnosis not present

## 2020-03-07 DIAGNOSIS — L0100 Impetigo, unspecified: Secondary | ICD-10-CM | POA: Diagnosis not present

## 2020-03-28 DIAGNOSIS — F39 Unspecified mood [affective] disorder: Secondary | ICD-10-CM | POA: Diagnosis not present

## 2020-03-28 DIAGNOSIS — F411 Generalized anxiety disorder: Secondary | ICD-10-CM | POA: Diagnosis not present

## 2020-03-28 DIAGNOSIS — Z6841 Body Mass Index (BMI) 40.0 and over, adult: Secondary | ICD-10-CM | POA: Diagnosis not present

## 2020-03-28 DIAGNOSIS — R69 Illness, unspecified: Secondary | ICD-10-CM | POA: Diagnosis not present

## 2020-04-06 ENCOUNTER — Other Ambulatory Visit: Payer: Self-pay

## 2020-04-06 DIAGNOSIS — Z1231 Encounter for screening mammogram for malignant neoplasm of breast: Secondary | ICD-10-CM

## 2020-04-12 DIAGNOSIS — G4733 Obstructive sleep apnea (adult) (pediatric): Secondary | ICD-10-CM | POA: Diagnosis not present

## 2020-05-02 DIAGNOSIS — L281 Prurigo nodularis: Secondary | ICD-10-CM | POA: Diagnosis not present

## 2020-05-02 DIAGNOSIS — L299 Pruritus, unspecified: Secondary | ICD-10-CM | POA: Diagnosis not present

## 2020-05-04 DIAGNOSIS — E559 Vitamin D deficiency, unspecified: Secondary | ICD-10-CM | POA: Diagnosis not present

## 2020-05-04 DIAGNOSIS — R195 Other fecal abnormalities: Secondary | ICD-10-CM | POA: Diagnosis not present

## 2020-05-04 DIAGNOSIS — E038 Other specified hypothyroidism: Secondary | ICD-10-CM | POA: Diagnosis not present

## 2020-05-12 DIAGNOSIS — F39 Unspecified mood [affective] disorder: Secondary | ICD-10-CM | POA: Diagnosis not present

## 2020-05-12 DIAGNOSIS — E785 Hyperlipidemia, unspecified: Secondary | ICD-10-CM | POA: Diagnosis not present

## 2020-05-12 DIAGNOSIS — E038 Other specified hypothyroidism: Secondary | ICD-10-CM | POA: Diagnosis not present

## 2020-05-12 DIAGNOSIS — R69 Illness, unspecified: Secondary | ICD-10-CM | POA: Diagnosis not present

## 2020-05-12 DIAGNOSIS — E559 Vitamin D deficiency, unspecified: Secondary | ICD-10-CM | POA: Diagnosis not present

## 2020-05-12 DIAGNOSIS — E7439 Other disorders of intestinal carbohydrate absorption: Secondary | ICD-10-CM | POA: Diagnosis not present

## 2020-05-12 DIAGNOSIS — K449 Diaphragmatic hernia without obstruction or gangrene: Secondary | ICD-10-CM | POA: Insufficient documentation

## 2020-05-12 DIAGNOSIS — G40309 Generalized idiopathic epilepsy and epileptic syndromes, not intractable, without status epilepticus: Secondary | ICD-10-CM | POA: Diagnosis not present

## 2020-05-12 DIAGNOSIS — L281 Prurigo nodularis: Secondary | ICD-10-CM | POA: Diagnosis not present

## 2020-05-12 DIAGNOSIS — G4739 Other sleep apnea: Secondary | ICD-10-CM | POA: Diagnosis not present

## 2020-05-17 DIAGNOSIS — D509 Iron deficiency anemia, unspecified: Secondary | ICD-10-CM | POA: Diagnosis not present

## 2020-05-17 DIAGNOSIS — R1013 Epigastric pain: Secondary | ICD-10-CM | POA: Diagnosis not present

## 2020-05-22 DIAGNOSIS — H906 Mixed conductive and sensorineural hearing loss, bilateral: Secondary | ICD-10-CM | POA: Insufficient documentation

## 2020-05-31 DIAGNOSIS — D509 Iron deficiency anemia, unspecified: Secondary | ICD-10-CM | POA: Diagnosis not present

## 2020-06-01 DIAGNOSIS — G40909 Epilepsy, unspecified, not intractable, without status epilepticus: Secondary | ICD-10-CM | POA: Diagnosis not present

## 2020-06-01 DIAGNOSIS — H7201 Central perforation of tympanic membrane, right ear: Secondary | ICD-10-CM | POA: Diagnosis not present

## 2020-06-01 DIAGNOSIS — H906 Mixed conductive and sensorineural hearing loss, bilateral: Secondary | ICD-10-CM | POA: Diagnosis not present

## 2020-06-01 DIAGNOSIS — Z9889 Other specified postprocedural states: Secondary | ICD-10-CM | POA: Diagnosis not present

## 2020-06-01 DIAGNOSIS — Z9089 Acquired absence of other organs: Secondary | ICD-10-CM | POA: Insufficient documentation

## 2020-06-09 DIAGNOSIS — E038 Other specified hypothyroidism: Secondary | ICD-10-CM | POA: Diagnosis not present

## 2020-06-27 DIAGNOSIS — L281 Prurigo nodularis: Secondary | ICD-10-CM | POA: Diagnosis not present

## 2020-06-27 DIAGNOSIS — L299 Pruritus, unspecified: Secondary | ICD-10-CM | POA: Diagnosis not present

## 2020-07-05 DIAGNOSIS — R69 Illness, unspecified: Secondary | ICD-10-CM | POA: Diagnosis not present

## 2020-07-05 DIAGNOSIS — F39 Unspecified mood [affective] disorder: Secondary | ICD-10-CM | POA: Diagnosis not present

## 2020-07-05 DIAGNOSIS — F411 Generalized anxiety disorder: Secondary | ICD-10-CM | POA: Diagnosis not present

## 2020-07-05 DIAGNOSIS — F79 Unspecified intellectual disabilities: Secondary | ICD-10-CM | POA: Diagnosis not present

## 2020-08-24 ENCOUNTER — Ambulatory Visit: Payer: Medicare HMO | Admitting: Internal Medicine

## 2020-08-29 DIAGNOSIS — R69 Illness, unspecified: Secondary | ICD-10-CM | POA: Diagnosis not present

## 2020-08-29 DIAGNOSIS — F39 Unspecified mood [affective] disorder: Secondary | ICD-10-CM | POA: Diagnosis not present

## 2020-08-29 DIAGNOSIS — F411 Generalized anxiety disorder: Secondary | ICD-10-CM | POA: Diagnosis not present

## 2020-09-08 DIAGNOSIS — E038 Other specified hypothyroidism: Secondary | ICD-10-CM | POA: Diagnosis not present

## 2020-09-08 DIAGNOSIS — K449 Diaphragmatic hernia without obstruction or gangrene: Secondary | ICD-10-CM | POA: Diagnosis not present

## 2020-09-08 DIAGNOSIS — E785 Hyperlipidemia, unspecified: Secondary | ICD-10-CM | POA: Diagnosis not present

## 2020-09-08 DIAGNOSIS — E559 Vitamin D deficiency, unspecified: Secondary | ICD-10-CM | POA: Diagnosis not present

## 2020-09-15 DIAGNOSIS — E538 Deficiency of other specified B group vitamins: Secondary | ICD-10-CM | POA: Diagnosis not present

## 2020-09-15 DIAGNOSIS — G4739 Other sleep apnea: Secondary | ICD-10-CM | POA: Diagnosis not present

## 2020-09-15 DIAGNOSIS — E038 Other specified hypothyroidism: Secondary | ICD-10-CM | POA: Diagnosis not present

## 2020-09-15 DIAGNOSIS — Z0000 Encounter for general adult medical examination without abnormal findings: Secondary | ICD-10-CM | POA: Diagnosis not present

## 2020-09-15 DIAGNOSIS — E559 Vitamin D deficiency, unspecified: Secondary | ICD-10-CM | POA: Diagnosis not present

## 2020-09-15 DIAGNOSIS — E785 Hyperlipidemia, unspecified: Secondary | ICD-10-CM | POA: Diagnosis not present

## 2020-09-15 DIAGNOSIS — M7989 Other specified soft tissue disorders: Secondary | ICD-10-CM | POA: Diagnosis not present

## 2020-09-15 DIAGNOSIS — R0600 Dyspnea, unspecified: Secondary | ICD-10-CM | POA: Diagnosis not present

## 2020-09-15 DIAGNOSIS — E7439 Other disorders of intestinal carbohydrate absorption: Secondary | ICD-10-CM | POA: Diagnosis not present

## 2020-09-21 DIAGNOSIS — Z6841 Body Mass Index (BMI) 40.0 and over, adult: Secondary | ICD-10-CM | POA: Diagnosis not present

## 2020-09-21 DIAGNOSIS — R0600 Dyspnea, unspecified: Secondary | ICD-10-CM | POA: Diagnosis not present

## 2020-09-21 DIAGNOSIS — R9431 Abnormal electrocardiogram [ECG] [EKG]: Secondary | ICD-10-CM | POA: Diagnosis not present

## 2020-10-11 DIAGNOSIS — N644 Mastodynia: Secondary | ICD-10-CM | POA: Diagnosis not present

## 2020-10-11 DIAGNOSIS — M7989 Other specified soft tissue disorders: Secondary | ICD-10-CM | POA: Diagnosis not present

## 2020-10-11 DIAGNOSIS — R928 Other abnormal and inconclusive findings on diagnostic imaging of breast: Secondary | ICD-10-CM | POA: Diagnosis not present

## 2020-10-19 DIAGNOSIS — R9431 Abnormal electrocardiogram [ECG] [EKG]: Secondary | ICD-10-CM | POA: Diagnosis not present

## 2020-10-19 DIAGNOSIS — R0600 Dyspnea, unspecified: Secondary | ICD-10-CM | POA: Diagnosis not present

## 2020-10-23 DIAGNOSIS — E538 Deficiency of other specified B group vitamins: Secondary | ICD-10-CM | POA: Diagnosis not present

## 2020-10-26 DIAGNOSIS — R21 Rash and other nonspecific skin eruption: Secondary | ICD-10-CM | POA: Diagnosis not present

## 2020-11-13 DIAGNOSIS — R1310 Dysphagia, unspecified: Secondary | ICD-10-CM | POA: Diagnosis not present

## 2020-11-13 DIAGNOSIS — K219 Gastro-esophageal reflux disease without esophagitis: Secondary | ICD-10-CM | POA: Diagnosis not present

## 2020-11-13 DIAGNOSIS — D509 Iron deficiency anemia, unspecified: Secondary | ICD-10-CM | POA: Diagnosis not present

## 2020-11-28 DIAGNOSIS — F411 Generalized anxiety disorder: Secondary | ICD-10-CM | POA: Diagnosis not present

## 2020-11-28 DIAGNOSIS — R69 Illness, unspecified: Secondary | ICD-10-CM | POA: Diagnosis not present

## 2020-11-28 DIAGNOSIS — F39 Unspecified mood [affective] disorder: Secondary | ICD-10-CM | POA: Diagnosis not present

## 2020-12-05 DIAGNOSIS — Z6841 Body Mass Index (BMI) 40.0 and over, adult: Secondary | ICD-10-CM | POA: Diagnosis not present

## 2020-12-05 DIAGNOSIS — Z13228 Encounter for screening for other metabolic disorders: Secondary | ICD-10-CM | POA: Diagnosis not present

## 2020-12-05 DIAGNOSIS — R69 Illness, unspecified: Secondary | ICD-10-CM | POA: Diagnosis not present

## 2020-12-05 DIAGNOSIS — E039 Hypothyroidism, unspecified: Secondary | ICD-10-CM | POA: Diagnosis not present

## 2020-12-05 DIAGNOSIS — R7303 Prediabetes: Secondary | ICD-10-CM | POA: Diagnosis not present

## 2020-12-05 DIAGNOSIS — K219 Gastro-esophageal reflux disease without esophagitis: Secondary | ICD-10-CM | POA: Diagnosis not present

## 2020-12-05 DIAGNOSIS — E785 Hyperlipidemia, unspecified: Secondary | ICD-10-CM | POA: Diagnosis not present

## 2020-12-05 DIAGNOSIS — F79 Unspecified intellectual disabilities: Secondary | ICD-10-CM | POA: Diagnosis not present

## 2020-12-05 DIAGNOSIS — E559 Vitamin D deficiency, unspecified: Secondary | ICD-10-CM | POA: Diagnosis not present

## 2020-12-05 DIAGNOSIS — G4739 Other sleep apnea: Secondary | ICD-10-CM | POA: Diagnosis not present

## 2020-12-05 DIAGNOSIS — F319 Bipolar disorder, unspecified: Secondary | ICD-10-CM | POA: Diagnosis not present

## 2020-12-14 DIAGNOSIS — Z713 Dietary counseling and surveillance: Secondary | ICD-10-CM | POA: Diagnosis not present

## 2020-12-14 DIAGNOSIS — Z6841 Body Mass Index (BMI) 40.0 and over, adult: Secondary | ICD-10-CM | POA: Diagnosis not present

## 2020-12-20 DIAGNOSIS — Z6841 Body Mass Index (BMI) 40.0 and over, adult: Secondary | ICD-10-CM | POA: Diagnosis not present

## 2020-12-20 DIAGNOSIS — Z713 Dietary counseling and surveillance: Secondary | ICD-10-CM | POA: Diagnosis not present

## 2021-01-15 DIAGNOSIS — E559 Vitamin D deficiency, unspecified: Secondary | ICD-10-CM | POA: Diagnosis not present

## 2021-01-15 DIAGNOSIS — E038 Other specified hypothyroidism: Secondary | ICD-10-CM | POA: Diagnosis not present

## 2021-01-15 DIAGNOSIS — H7201 Central perforation of tympanic membrane, right ear: Secondary | ICD-10-CM | POA: Diagnosis not present

## 2021-01-15 DIAGNOSIS — E785 Hyperlipidemia, unspecified: Secondary | ICD-10-CM | POA: Diagnosis not present

## 2021-01-15 DIAGNOSIS — R0609 Other forms of dyspnea: Secondary | ICD-10-CM | POA: Diagnosis not present

## 2021-01-15 DIAGNOSIS — H6123 Impacted cerumen, bilateral: Secondary | ICD-10-CM | POA: Diagnosis not present

## 2021-01-15 DIAGNOSIS — H906 Mixed conductive and sensorineural hearing loss, bilateral: Secondary | ICD-10-CM | POA: Diagnosis not present

## 2021-01-22 DIAGNOSIS — R7303 Prediabetes: Secondary | ICD-10-CM | POA: Diagnosis not present

## 2021-01-22 DIAGNOSIS — R569 Unspecified convulsions: Secondary | ICD-10-CM | POA: Diagnosis not present

## 2021-01-22 DIAGNOSIS — E872 Acidosis, unspecified: Secondary | ICD-10-CM | POA: Diagnosis not present

## 2021-01-22 DIAGNOSIS — F333 Major depressive disorder, recurrent, severe with psychotic symptoms: Secondary | ICD-10-CM | POA: Diagnosis not present

## 2021-01-22 DIAGNOSIS — R69 Illness, unspecified: Secondary | ICD-10-CM | POA: Diagnosis not present

## 2021-01-22 DIAGNOSIS — M79671 Pain in right foot: Secondary | ICD-10-CM | POA: Diagnosis not present

## 2021-01-22 DIAGNOSIS — E039 Hypothyroidism, unspecified: Secondary | ICD-10-CM | POA: Diagnosis not present

## 2021-01-22 DIAGNOSIS — E559 Vitamin D deficiency, unspecified: Secondary | ICD-10-CM | POA: Diagnosis not present

## 2021-01-22 DIAGNOSIS — E538 Deficiency of other specified B group vitamins: Secondary | ICD-10-CM | POA: Diagnosis not present

## 2021-01-22 DIAGNOSIS — E785 Hyperlipidemia, unspecified: Secondary | ICD-10-CM | POA: Diagnosis not present

## 2021-02-26 DIAGNOSIS — F39 Unspecified mood [affective] disorder: Secondary | ICD-10-CM | POA: Diagnosis not present

## 2021-02-26 DIAGNOSIS — F411 Generalized anxiety disorder: Secondary | ICD-10-CM | POA: Diagnosis not present

## 2021-02-26 DIAGNOSIS — R69 Illness, unspecified: Secondary | ICD-10-CM | POA: Diagnosis not present

## 2021-02-26 DIAGNOSIS — Z23 Encounter for immunization: Secondary | ICD-10-CM | POA: Diagnosis not present

## 2021-02-26 DIAGNOSIS — F333 Major depressive disorder, recurrent, severe with psychotic symptoms: Secondary | ICD-10-CM | POA: Diagnosis not present

## 2021-02-27 DIAGNOSIS — E538 Deficiency of other specified B group vitamins: Secondary | ICD-10-CM | POA: Diagnosis not present

## 2021-03-15 DIAGNOSIS — M79671 Pain in right foot: Secondary | ICD-10-CM | POA: Diagnosis not present

## 2021-03-15 DIAGNOSIS — M722 Plantar fascial fibromatosis: Secondary | ICD-10-CM | POA: Diagnosis not present

## 2021-03-29 DIAGNOSIS — E538 Deficiency of other specified B group vitamins: Secondary | ICD-10-CM | POA: Diagnosis not present

## 2021-04-30 DIAGNOSIS — F319 Bipolar disorder, unspecified: Secondary | ICD-10-CM | POA: Diagnosis not present

## 2021-04-30 DIAGNOSIS — R69 Illness, unspecified: Secondary | ICD-10-CM | POA: Diagnosis not present

## 2021-04-30 DIAGNOSIS — F411 Generalized anxiety disorder: Secondary | ICD-10-CM | POA: Diagnosis not present

## 2021-04-30 DIAGNOSIS — F39 Unspecified mood [affective] disorder: Secondary | ICD-10-CM | POA: Diagnosis not present

## 2021-04-30 DIAGNOSIS — E538 Deficiency of other specified B group vitamins: Secondary | ICD-10-CM | POA: Diagnosis not present

## 2021-05-10 DIAGNOSIS — R059 Cough, unspecified: Secondary | ICD-10-CM | POA: Diagnosis not present

## 2021-05-25 DIAGNOSIS — E559 Vitamin D deficiency, unspecified: Secondary | ICD-10-CM | POA: Diagnosis not present

## 2021-05-25 DIAGNOSIS — E039 Hypothyroidism, unspecified: Secondary | ICD-10-CM | POA: Diagnosis not present

## 2021-05-25 DIAGNOSIS — R7303 Prediabetes: Secondary | ICD-10-CM | POA: Diagnosis not present

## 2021-05-25 DIAGNOSIS — Z79899 Other long term (current) drug therapy: Secondary | ICD-10-CM | POA: Diagnosis not present

## 2021-05-25 DIAGNOSIS — E785 Hyperlipidemia, unspecified: Secondary | ICD-10-CM | POA: Diagnosis not present

## 2021-06-01 DIAGNOSIS — E7439 Other disorders of intestinal carbohydrate absorption: Secondary | ICD-10-CM | POA: Diagnosis not present

## 2021-06-01 DIAGNOSIS — E538 Deficiency of other specified B group vitamins: Secondary | ICD-10-CM | POA: Diagnosis not present

## 2021-06-01 DIAGNOSIS — Z79899 Other long term (current) drug therapy: Secondary | ICD-10-CM | POA: Diagnosis not present

## 2021-06-01 DIAGNOSIS — E039 Hypothyroidism, unspecified: Secondary | ICD-10-CM | POA: Diagnosis not present

## 2021-06-01 DIAGNOSIS — G40309 Generalized idiopathic epilepsy and epileptic syndromes, not intractable, without status epilepticus: Secondary | ICD-10-CM | POA: Diagnosis not present

## 2021-06-01 DIAGNOSIS — E785 Hyperlipidemia, unspecified: Secondary | ICD-10-CM | POA: Diagnosis not present

## 2021-06-01 DIAGNOSIS — E559 Vitamin D deficiency, unspecified: Secondary | ICD-10-CM | POA: Diagnosis not present

## 2021-06-01 DIAGNOSIS — E038 Other specified hypothyroidism: Secondary | ICD-10-CM | POA: Diagnosis not present

## 2021-06-05 DIAGNOSIS — H7291 Unspecified perforation of tympanic membrane, right ear: Secondary | ICD-10-CM | POA: Diagnosis not present

## 2021-06-05 DIAGNOSIS — H906 Mixed conductive and sensorineural hearing loss, bilateral: Secondary | ICD-10-CM | POA: Diagnosis not present

## 2021-06-05 DIAGNOSIS — H6123 Impacted cerumen, bilateral: Secondary | ICD-10-CM | POA: Diagnosis not present

## 2021-06-26 DIAGNOSIS — F312 Bipolar disorder, current episode manic severe with psychotic features: Secondary | ICD-10-CM | POA: Diagnosis not present

## 2021-06-26 DIAGNOSIS — F39 Unspecified mood [affective] disorder: Secondary | ICD-10-CM | POA: Diagnosis not present

## 2021-06-26 DIAGNOSIS — F411 Generalized anxiety disorder: Secondary | ICD-10-CM | POA: Diagnosis not present

## 2021-06-26 DIAGNOSIS — R69 Illness, unspecified: Secondary | ICD-10-CM | POA: Diagnosis not present

## 2021-07-31 DIAGNOSIS — R6883 Chills (without fever): Secondary | ICD-10-CM | POA: Diagnosis not present

## 2021-07-31 DIAGNOSIS — R630 Anorexia: Secondary | ICD-10-CM | POA: Diagnosis not present

## 2021-07-31 DIAGNOSIS — R5383 Other fatigue: Secondary | ICD-10-CM | POA: Diagnosis not present

## 2021-07-31 DIAGNOSIS — R059 Cough, unspecified: Secondary | ICD-10-CM | POA: Diagnosis not present

## 2021-07-31 DIAGNOSIS — R110 Nausea: Secondary | ICD-10-CM | POA: Diagnosis not present

## 2021-08-08 DIAGNOSIS — F333 Major depressive disorder, recurrent, severe with psychotic symptoms: Secondary | ICD-10-CM | POA: Diagnosis not present

## 2021-08-08 DIAGNOSIS — R059 Cough, unspecified: Secondary | ICD-10-CM | POA: Diagnosis not present

## 2021-08-08 DIAGNOSIS — R69 Illness, unspecified: Secondary | ICD-10-CM | POA: Diagnosis not present

## 2021-08-28 DIAGNOSIS — R69 Illness, unspecified: Secondary | ICD-10-CM | POA: Diagnosis not present

## 2021-08-28 DIAGNOSIS — F411 Generalized anxiety disorder: Secondary | ICD-10-CM | POA: Diagnosis not present

## 2021-08-28 DIAGNOSIS — F39 Unspecified mood [affective] disorder: Secondary | ICD-10-CM | POA: Diagnosis not present

## 2021-08-28 DIAGNOSIS — F312 Bipolar disorder, current episode manic severe with psychotic features: Secondary | ICD-10-CM | POA: Diagnosis not present

## 2021-10-01 DIAGNOSIS — F39 Unspecified mood [affective] disorder: Secondary | ICD-10-CM | POA: Diagnosis not present

## 2021-10-01 DIAGNOSIS — Z6841 Body Mass Index (BMI) 40.0 and over, adult: Secondary | ICD-10-CM | POA: Diagnosis not present

## 2021-10-01 DIAGNOSIS — E038 Other specified hypothyroidism: Secondary | ICD-10-CM | POA: Diagnosis not present

## 2021-10-01 DIAGNOSIS — E538 Deficiency of other specified B group vitamins: Secondary | ICD-10-CM | POA: Diagnosis not present

## 2021-10-01 DIAGNOSIS — E559 Vitamin D deficiency, unspecified: Secondary | ICD-10-CM | POA: Diagnosis not present

## 2021-10-01 DIAGNOSIS — E785 Hyperlipidemia, unspecified: Secondary | ICD-10-CM | POA: Diagnosis not present

## 2021-10-01 DIAGNOSIS — R69 Illness, unspecified: Secondary | ICD-10-CM | POA: Diagnosis not present

## 2021-10-03 DIAGNOSIS — E559 Vitamin D deficiency, unspecified: Secondary | ICD-10-CM | POA: Diagnosis not present

## 2021-10-03 DIAGNOSIS — E785 Hyperlipidemia, unspecified: Secondary | ICD-10-CM | POA: Diagnosis not present

## 2021-10-03 DIAGNOSIS — Z79899 Other long term (current) drug therapy: Secondary | ICD-10-CM | POA: Diagnosis not present

## 2021-10-03 DIAGNOSIS — E7439 Other disorders of intestinal carbohydrate absorption: Secondary | ICD-10-CM | POA: Diagnosis not present

## 2021-10-03 DIAGNOSIS — E039 Hypothyroidism, unspecified: Secondary | ICD-10-CM | POA: Diagnosis not present

## 2021-10-11 DIAGNOSIS — G40309 Generalized idiopathic epilepsy and epileptic syndromes, not intractable, without status epilepticus: Secondary | ICD-10-CM | POA: Diagnosis not present

## 2021-10-11 DIAGNOSIS — G4733 Obstructive sleep apnea (adult) (pediatric): Secondary | ICD-10-CM | POA: Diagnosis not present

## 2021-11-15 DIAGNOSIS — Z1231 Encounter for screening mammogram for malignant neoplasm of breast: Secondary | ICD-10-CM | POA: Diagnosis not present

## 2021-12-17 DIAGNOSIS — E538 Deficiency of other specified B group vitamins: Secondary | ICD-10-CM | POA: Diagnosis not present

## 2022-01-08 DIAGNOSIS — F39 Unspecified mood [affective] disorder: Secondary | ICD-10-CM | POA: Diagnosis not present

## 2022-01-08 DIAGNOSIS — F319 Bipolar disorder, unspecified: Secondary | ICD-10-CM | POA: Diagnosis not present

## 2022-01-08 DIAGNOSIS — F411 Generalized anxiety disorder: Secondary | ICD-10-CM | POA: Diagnosis not present

## 2022-01-08 DIAGNOSIS — R69 Illness, unspecified: Secondary | ICD-10-CM | POA: Diagnosis not present

## 2022-01-17 DIAGNOSIS — E538 Deficiency of other specified B group vitamins: Secondary | ICD-10-CM | POA: Diagnosis not present

## 2022-01-21 DIAGNOSIS — D485 Neoplasm of uncertain behavior of skin: Secondary | ICD-10-CM | POA: Diagnosis not present

## 2022-01-21 DIAGNOSIS — L281 Prurigo nodularis: Secondary | ICD-10-CM | POA: Diagnosis not present

## 2022-01-21 DIAGNOSIS — L299 Pruritus, unspecified: Secondary | ICD-10-CM | POA: Diagnosis not present

## 2022-02-01 DIAGNOSIS — E785 Hyperlipidemia, unspecified: Secondary | ICD-10-CM | POA: Diagnosis not present

## 2022-02-01 DIAGNOSIS — Z79899 Other long term (current) drug therapy: Secondary | ICD-10-CM | POA: Diagnosis not present

## 2022-02-01 DIAGNOSIS — E7439 Other disorders of intestinal carbohydrate absorption: Secondary | ICD-10-CM | POA: Diagnosis not present

## 2022-02-01 DIAGNOSIS — E559 Vitamin D deficiency, unspecified: Secondary | ICD-10-CM | POA: Diagnosis not present

## 2022-02-01 DIAGNOSIS — E039 Hypothyroidism, unspecified: Secondary | ICD-10-CM | POA: Diagnosis not present

## 2022-02-08 DIAGNOSIS — Z23 Encounter for immunization: Secondary | ICD-10-CM | POA: Diagnosis not present

## 2022-02-08 DIAGNOSIS — D539 Nutritional anemia, unspecified: Secondary | ICD-10-CM | POA: Diagnosis not present

## 2022-02-08 DIAGNOSIS — E538 Deficiency of other specified B group vitamins: Secondary | ICD-10-CM | POA: Diagnosis not present

## 2022-02-08 DIAGNOSIS — E785 Hyperlipidemia, unspecified: Secondary | ICD-10-CM | POA: Diagnosis not present

## 2022-02-08 DIAGNOSIS — E7439 Other disorders of intestinal carbohydrate absorption: Secondary | ICD-10-CM | POA: Diagnosis not present

## 2022-02-08 DIAGNOSIS — E038 Other specified hypothyroidism: Secondary | ICD-10-CM | POA: Diagnosis not present

## 2022-02-08 DIAGNOSIS — Z0000 Encounter for general adult medical examination without abnormal findings: Secondary | ICD-10-CM | POA: Diagnosis not present

## 2022-02-08 DIAGNOSIS — E559 Vitamin D deficiency, unspecified: Secondary | ICD-10-CM | POA: Diagnosis not present

## 2022-02-08 DIAGNOSIS — F411 Generalized anxiety disorder: Secondary | ICD-10-CM | POA: Diagnosis not present

## 2022-02-08 DIAGNOSIS — R69 Illness, unspecified: Secondary | ICD-10-CM | POA: Diagnosis not present

## 2022-02-18 DIAGNOSIS — L209 Atopic dermatitis, unspecified: Secondary | ICD-10-CM | POA: Diagnosis not present

## 2022-02-18 DIAGNOSIS — L299 Pruritus, unspecified: Secondary | ICD-10-CM | POA: Diagnosis not present

## 2022-02-18 DIAGNOSIS — E538 Deficiency of other specified B group vitamins: Secondary | ICD-10-CM | POA: Diagnosis not present

## 2022-02-18 DIAGNOSIS — L820 Inflamed seborrheic keratosis: Secondary | ICD-10-CM | POA: Diagnosis not present

## 2022-03-06 DIAGNOSIS — L209 Atopic dermatitis, unspecified: Secondary | ICD-10-CM | POA: Diagnosis not present

## 2022-03-21 DIAGNOSIS — E538 Deficiency of other specified B group vitamins: Secondary | ICD-10-CM | POA: Diagnosis not present

## 2022-04-08 DIAGNOSIS — Z880 Allergy status to penicillin: Secondary | ICD-10-CM | POA: Diagnosis not present

## 2022-04-08 DIAGNOSIS — H7291 Unspecified perforation of tympanic membrane, right ear: Secondary | ICD-10-CM | POA: Diagnosis not present

## 2022-04-08 DIAGNOSIS — H6123 Impacted cerumen, bilateral: Secondary | ICD-10-CM | POA: Diagnosis not present

## 2022-04-08 DIAGNOSIS — Z881 Allergy status to other antibiotic agents status: Secondary | ICD-10-CM | POA: Diagnosis not present

## 2022-04-10 DIAGNOSIS — R69 Illness, unspecified: Secondary | ICD-10-CM | POA: Diagnosis not present

## 2022-04-10 DIAGNOSIS — F411 Generalized anxiety disorder: Secondary | ICD-10-CM | POA: Diagnosis not present

## 2022-04-10 DIAGNOSIS — F39 Unspecified mood [affective] disorder: Secondary | ICD-10-CM | POA: Diagnosis not present

## 2022-04-10 DIAGNOSIS — F319 Bipolar disorder, unspecified: Secondary | ICD-10-CM | POA: Diagnosis not present

## 2022-04-22 DIAGNOSIS — E538 Deficiency of other specified B group vitamins: Secondary | ICD-10-CM | POA: Diagnosis not present

## 2022-04-29 DIAGNOSIS — Z0100 Encounter for examination of eyes and vision without abnormal findings: Secondary | ICD-10-CM | POA: Diagnosis not present

## 2022-05-08 DIAGNOSIS — H5213 Myopia, bilateral: Secondary | ICD-10-CM | POA: Diagnosis not present

## 2022-05-08 DIAGNOSIS — H52209 Unspecified astigmatism, unspecified eye: Secondary | ICD-10-CM | POA: Diagnosis not present

## 2022-05-08 DIAGNOSIS — H524 Presbyopia: Secondary | ICD-10-CM | POA: Diagnosis not present

## 2022-05-23 DIAGNOSIS — E538 Deficiency of other specified B group vitamins: Secondary | ICD-10-CM | POA: Diagnosis not present

## 2022-06-05 DIAGNOSIS — E559 Vitamin D deficiency, unspecified: Secondary | ICD-10-CM | POA: Diagnosis not present

## 2022-06-05 DIAGNOSIS — E785 Hyperlipidemia, unspecified: Secondary | ICD-10-CM | POA: Diagnosis not present

## 2022-06-05 DIAGNOSIS — E7439 Other disorders of intestinal carbohydrate absorption: Secondary | ICD-10-CM | POA: Diagnosis not present

## 2022-06-05 DIAGNOSIS — D539 Nutritional anemia, unspecified: Secondary | ICD-10-CM | POA: Diagnosis not present

## 2022-06-05 DIAGNOSIS — E038 Other specified hypothyroidism: Secondary | ICD-10-CM | POA: Diagnosis not present

## 2022-06-11 DIAGNOSIS — R69 Illness, unspecified: Secondary | ICD-10-CM | POA: Diagnosis not present

## 2022-06-11 DIAGNOSIS — F39 Unspecified mood [affective] disorder: Secondary | ICD-10-CM | POA: Diagnosis not present

## 2022-06-11 DIAGNOSIS — F411 Generalized anxiety disorder: Secondary | ICD-10-CM | POA: Diagnosis not present

## 2022-06-11 DIAGNOSIS — E7439 Other disorders of intestinal carbohydrate absorption: Secondary | ICD-10-CM | POA: Diagnosis not present

## 2022-06-11 DIAGNOSIS — Z6841 Body Mass Index (BMI) 40.0 and over, adult: Secondary | ICD-10-CM | POA: Diagnosis not present

## 2022-06-11 DIAGNOSIS — F319 Bipolar disorder, unspecified: Secondary | ICD-10-CM | POA: Diagnosis not present

## 2022-06-11 DIAGNOSIS — G40309 Generalized idiopathic epilepsy and epileptic syndromes, not intractable, without status epilepticus: Secondary | ICD-10-CM | POA: Diagnosis not present

## 2022-06-11 DIAGNOSIS — E785 Hyperlipidemia, unspecified: Secondary | ICD-10-CM | POA: Diagnosis not present

## 2022-06-11 DIAGNOSIS — E538 Deficiency of other specified B group vitamins: Secondary | ICD-10-CM | POA: Diagnosis not present

## 2022-06-11 DIAGNOSIS — R1319 Other dysphagia: Secondary | ICD-10-CM | POA: Diagnosis not present

## 2022-06-11 DIAGNOSIS — E038 Other specified hypothyroidism: Secondary | ICD-10-CM | POA: Diagnosis not present

## 2022-06-11 DIAGNOSIS — Z1330 Encounter for screening examination for mental health and behavioral disorders, unspecified: Secondary | ICD-10-CM | POA: Diagnosis not present

## 2022-06-11 DIAGNOSIS — E559 Vitamin D deficiency, unspecified: Secondary | ICD-10-CM | POA: Diagnosis not present

## 2022-06-11 DIAGNOSIS — D649 Anemia, unspecified: Secondary | ICD-10-CM | POA: Diagnosis not present

## 2022-06-17 DIAGNOSIS — L209 Atopic dermatitis, unspecified: Secondary | ICD-10-CM | POA: Diagnosis not present

## 2022-06-17 DIAGNOSIS — L299 Pruritus, unspecified: Secondary | ICD-10-CM | POA: Diagnosis not present

## 2022-06-17 DIAGNOSIS — L281 Prurigo nodularis: Secondary | ICD-10-CM | POA: Diagnosis not present

## 2022-06-26 DIAGNOSIS — Z86010 Personal history of colonic polyps: Secondary | ICD-10-CM | POA: Diagnosis not present

## 2022-06-26 DIAGNOSIS — K921 Melena: Secondary | ICD-10-CM | POA: Diagnosis not present

## 2022-06-26 DIAGNOSIS — D508 Other iron deficiency anemias: Secondary | ICD-10-CM | POA: Diagnosis not present

## 2022-06-26 DIAGNOSIS — K219 Gastro-esophageal reflux disease without esophagitis: Secondary | ICD-10-CM | POA: Diagnosis not present

## 2022-06-26 DIAGNOSIS — R1310 Dysphagia, unspecified: Secondary | ICD-10-CM | POA: Diagnosis not present

## 2022-07-02 DIAGNOSIS — E538 Deficiency of other specified B group vitamins: Secondary | ICD-10-CM | POA: Diagnosis not present

## 2022-07-16 DIAGNOSIS — E038 Other specified hypothyroidism: Secondary | ICD-10-CM | POA: Diagnosis not present

## 2022-07-16 DIAGNOSIS — D649 Anemia, unspecified: Secondary | ICD-10-CM | POA: Diagnosis not present

## 2022-07-29 DIAGNOSIS — L281 Prurigo nodularis: Secondary | ICD-10-CM | POA: Diagnosis not present

## 2022-07-29 DIAGNOSIS — L209 Atopic dermatitis, unspecified: Secondary | ICD-10-CM | POA: Diagnosis not present

## 2022-07-29 DIAGNOSIS — L299 Pruritus, unspecified: Secondary | ICD-10-CM | POA: Diagnosis not present

## 2022-08-01 DIAGNOSIS — E538 Deficiency of other specified B group vitamins: Secondary | ICD-10-CM | POA: Diagnosis not present

## 2022-09-03 DIAGNOSIS — E538 Deficiency of other specified B group vitamins: Secondary | ICD-10-CM | POA: Diagnosis not present

## 2022-09-11 DIAGNOSIS — F39 Unspecified mood [affective] disorder: Secondary | ICD-10-CM | POA: Diagnosis not present

## 2022-09-11 DIAGNOSIS — F319 Bipolar disorder, unspecified: Secondary | ICD-10-CM | POA: Diagnosis not present

## 2022-09-11 DIAGNOSIS — F411 Generalized anxiety disorder: Secondary | ICD-10-CM | POA: Diagnosis not present

## 2022-09-24 DIAGNOSIS — R1310 Dysphagia, unspecified: Secondary | ICD-10-CM | POA: Diagnosis not present

## 2022-09-24 DIAGNOSIS — K208 Other esophagitis without bleeding: Secondary | ICD-10-CM | POA: Diagnosis not present

## 2022-09-24 DIAGNOSIS — D509 Iron deficiency anemia, unspecified: Secondary | ICD-10-CM | POA: Diagnosis not present

## 2022-09-24 DIAGNOSIS — F32A Depression, unspecified: Secondary | ICD-10-CM | POA: Diagnosis not present

## 2022-09-24 DIAGNOSIS — Z538 Procedure and treatment not carried out for other reasons: Secondary | ICD-10-CM | POA: Diagnosis not present

## 2022-10-07 DIAGNOSIS — H6123 Impacted cerumen, bilateral: Secondary | ICD-10-CM | POA: Diagnosis not present

## 2022-10-07 DIAGNOSIS — E538 Deficiency of other specified B group vitamins: Secondary | ICD-10-CM | POA: Diagnosis not present

## 2022-10-07 DIAGNOSIS — H906 Mixed conductive and sensorineural hearing loss, bilateral: Secondary | ICD-10-CM | POA: Diagnosis not present

## 2022-10-07 DIAGNOSIS — Z9889 Other specified postprocedural states: Secondary | ICD-10-CM | POA: Diagnosis not present

## 2022-10-07 DIAGNOSIS — H7291 Unspecified perforation of tympanic membrane, right ear: Secondary | ICD-10-CM | POA: Diagnosis not present

## 2022-10-21 DIAGNOSIS — E785 Hyperlipidemia, unspecified: Secondary | ICD-10-CM | POA: Diagnosis not present

## 2022-10-21 DIAGNOSIS — D649 Anemia, unspecified: Secondary | ICD-10-CM | POA: Diagnosis not present

## 2022-10-21 DIAGNOSIS — E559 Vitamin D deficiency, unspecified: Secondary | ICD-10-CM | POA: Diagnosis not present

## 2022-10-21 DIAGNOSIS — E7439 Other disorders of intestinal carbohydrate absorption: Secondary | ICD-10-CM | POA: Diagnosis not present

## 2022-10-21 DIAGNOSIS — E038 Other specified hypothyroidism: Secondary | ICD-10-CM | POA: Diagnosis not present

## 2022-10-28 DIAGNOSIS — F39 Unspecified mood [affective] disorder: Secondary | ICD-10-CM | POA: Diagnosis not present

## 2022-10-28 DIAGNOSIS — E038 Other specified hypothyroidism: Secondary | ICD-10-CM | POA: Diagnosis not present

## 2022-10-28 DIAGNOSIS — E559 Vitamin D deficiency, unspecified: Secondary | ICD-10-CM | POA: Diagnosis not present

## 2022-10-28 DIAGNOSIS — N289 Disorder of kidney and ureter, unspecified: Secondary | ICD-10-CM | POA: Diagnosis not present

## 2022-10-28 DIAGNOSIS — E785 Hyperlipidemia, unspecified: Secondary | ICD-10-CM | POA: Diagnosis not present

## 2022-10-28 DIAGNOSIS — E7439 Other disorders of intestinal carbohydrate absorption: Secondary | ICD-10-CM | POA: Diagnosis not present

## 2022-10-28 DIAGNOSIS — Z6841 Body Mass Index (BMI) 40.0 and over, adult: Secondary | ICD-10-CM | POA: Diagnosis not present

## 2022-10-28 DIAGNOSIS — Z79899 Other long term (current) drug therapy: Secondary | ICD-10-CM | POA: Diagnosis not present

## 2022-11-11 DIAGNOSIS — E538 Deficiency of other specified B group vitamins: Secondary | ICD-10-CM | POA: Diagnosis not present

## 2022-12-05 DIAGNOSIS — Z8371 Family history of adenomatous and serrated polyps: Secondary | ICD-10-CM | POA: Diagnosis not present

## 2022-12-05 DIAGNOSIS — Z86010 Personal history of colonic polyps: Secondary | ICD-10-CM | POA: Diagnosis not present

## 2022-12-05 DIAGNOSIS — Z1211 Encounter for screening for malignant neoplasm of colon: Secondary | ICD-10-CM | POA: Diagnosis not present

## 2022-12-05 DIAGNOSIS — E7800 Pure hypercholesterolemia, unspecified: Secondary | ICD-10-CM | POA: Diagnosis not present

## 2022-12-05 DIAGNOSIS — Z09 Encounter for follow-up examination after completed treatment for conditions other than malignant neoplasm: Secondary | ICD-10-CM | POA: Diagnosis not present

## 2022-12-05 DIAGNOSIS — Z538 Procedure and treatment not carried out for other reasons: Secondary | ICD-10-CM | POA: Diagnosis not present

## 2022-12-06 DIAGNOSIS — Z09 Encounter for follow-up examination after completed treatment for conditions other than malignant neoplasm: Secondary | ICD-10-CM | POA: Diagnosis not present

## 2022-12-06 DIAGNOSIS — F419 Anxiety disorder, unspecified: Secondary | ICD-10-CM | POA: Diagnosis not present

## 2022-12-06 DIAGNOSIS — Z86010 Personal history of colonic polyps: Secondary | ICD-10-CM | POA: Diagnosis not present

## 2022-12-06 DIAGNOSIS — Z1211 Encounter for screening for malignant neoplasm of colon: Secondary | ICD-10-CM | POA: Diagnosis not present

## 2022-12-12 DIAGNOSIS — F411 Generalized anxiety disorder: Secondary | ICD-10-CM | POA: Diagnosis not present

## 2022-12-12 DIAGNOSIS — E538 Deficiency of other specified B group vitamins: Secondary | ICD-10-CM | POA: Diagnosis not present

## 2022-12-12 DIAGNOSIS — F39 Unspecified mood [affective] disorder: Secondary | ICD-10-CM | POA: Diagnosis not present

## 2022-12-12 DIAGNOSIS — F319 Bipolar disorder, unspecified: Secondary | ICD-10-CM | POA: Diagnosis not present

## 2023-01-13 DIAGNOSIS — E538 Deficiency of other specified B group vitamins: Secondary | ICD-10-CM | POA: Diagnosis not present

## 2023-02-12 ENCOUNTER — Encounter: Payer: Self-pay | Admitting: Psychiatry

## 2023-02-12 DIAGNOSIS — Z124 Encounter for screening for malignant neoplasm of cervix: Secondary | ICD-10-CM | POA: Diagnosis not present

## 2023-02-12 DIAGNOSIS — E039 Hypothyroidism, unspecified: Secondary | ICD-10-CM | POA: Diagnosis not present

## 2023-02-12 DIAGNOSIS — F411 Generalized anxiety disorder: Secondary | ICD-10-CM | POA: Diagnosis not present

## 2023-02-12 DIAGNOSIS — E538 Deficiency of other specified B group vitamins: Secondary | ICD-10-CM | POA: Diagnosis not present

## 2023-02-12 DIAGNOSIS — Z0000 Encounter for general adult medical examination without abnormal findings: Secondary | ICD-10-CM | POA: Diagnosis not present

## 2023-02-12 DIAGNOSIS — E559 Vitamin D deficiency, unspecified: Secondary | ICD-10-CM | POA: Diagnosis not present

## 2023-02-12 DIAGNOSIS — E119 Type 2 diabetes mellitus without complications: Secondary | ICD-10-CM | POA: Diagnosis not present

## 2023-02-12 DIAGNOSIS — Z23 Encounter for immunization: Secondary | ICD-10-CM | POA: Diagnosis not present

## 2023-02-12 DIAGNOSIS — Z79899 Other long term (current) drug therapy: Secondary | ICD-10-CM | POA: Diagnosis not present

## 2023-02-12 DIAGNOSIS — E785 Hyperlipidemia, unspecified: Secondary | ICD-10-CM | POA: Diagnosis not present

## 2023-02-21 DIAGNOSIS — G40309 Generalized idiopathic epilepsy and epileptic syndromes, not intractable, without status epilepticus: Secondary | ICD-10-CM | POA: Diagnosis not present

## 2023-02-21 DIAGNOSIS — G4733 Obstructive sleep apnea (adult) (pediatric): Secondary | ICD-10-CM | POA: Diagnosis not present

## 2023-03-13 DIAGNOSIS — F39 Unspecified mood [affective] disorder: Secondary | ICD-10-CM | POA: Diagnosis not present

## 2023-03-13 DIAGNOSIS — Z79899 Other long term (current) drug therapy: Secondary | ICD-10-CM | POA: Diagnosis not present

## 2023-03-13 DIAGNOSIS — F319 Bipolar disorder, unspecified: Secondary | ICD-10-CM | POA: Diagnosis not present

## 2023-03-13 DIAGNOSIS — F411 Generalized anxiety disorder: Secondary | ICD-10-CM | POA: Diagnosis not present

## 2023-03-13 DIAGNOSIS — E039 Hypothyroidism, unspecified: Secondary | ICD-10-CM | POA: Diagnosis not present

## 2023-04-17 DIAGNOSIS — F424 Excoriation (skin-picking) disorder: Secondary | ICD-10-CM | POA: Diagnosis not present

## 2023-05-01 DIAGNOSIS — L292 Pruritus vulvae: Secondary | ICD-10-CM | POA: Diagnosis not present

## 2023-05-01 DIAGNOSIS — B356 Tinea cruris: Secondary | ICD-10-CM | POA: Diagnosis not present

## 2023-06-05 DIAGNOSIS — R92323 Mammographic fibroglandular density, bilateral breasts: Secondary | ICD-10-CM | POA: Diagnosis not present

## 2023-06-05 DIAGNOSIS — L292 Pruritus vulvae: Secondary | ICD-10-CM | POA: Diagnosis not present

## 2023-06-05 DIAGNOSIS — B356 Tinea cruris: Secondary | ICD-10-CM | POA: Diagnosis not present

## 2023-06-05 DIAGNOSIS — Z1231 Encounter for screening mammogram for malignant neoplasm of breast: Secondary | ICD-10-CM | POA: Diagnosis not present

## 2023-06-05 DIAGNOSIS — R928 Other abnormal and inconclusive findings on diagnostic imaging of breast: Secondary | ICD-10-CM | POA: Diagnosis not present

## 2023-06-11 DIAGNOSIS — F411 Generalized anxiety disorder: Secondary | ICD-10-CM | POA: Diagnosis not present

## 2023-06-11 DIAGNOSIS — F319 Bipolar disorder, unspecified: Secondary | ICD-10-CM | POA: Diagnosis not present

## 2023-06-11 DIAGNOSIS — F445 Conversion disorder with seizures or convulsions: Secondary | ICD-10-CM | POA: Diagnosis not present

## 2023-06-11 DIAGNOSIS — F39 Unspecified mood [affective] disorder: Secondary | ICD-10-CM | POA: Diagnosis not present

## 2023-07-03 DIAGNOSIS — E538 Deficiency of other specified B group vitamins: Secondary | ICD-10-CM | POA: Diagnosis not present

## 2023-07-06 DIAGNOSIS — R1012 Left upper quadrant pain: Secondary | ICD-10-CM | POA: Diagnosis not present

## 2023-07-08 DIAGNOSIS — L309 Dermatitis, unspecified: Secondary | ICD-10-CM | POA: Diagnosis not present

## 2023-07-08 DIAGNOSIS — B372 Candidiasis of skin and nail: Secondary | ICD-10-CM | POA: Diagnosis not present

## 2023-07-16 DIAGNOSIS — Z794 Long term (current) use of insulin: Secondary | ICD-10-CM | POA: Diagnosis not present

## 2023-07-16 DIAGNOSIS — E119 Type 2 diabetes mellitus without complications: Secondary | ICD-10-CM | POA: Diagnosis not present

## 2023-07-16 DIAGNOSIS — E038 Other specified hypothyroidism: Secondary | ICD-10-CM | POA: Diagnosis not present

## 2023-07-16 DIAGNOSIS — E559 Vitamin D deficiency, unspecified: Secondary | ICD-10-CM | POA: Diagnosis not present

## 2023-07-23 DIAGNOSIS — B354 Tinea corporis: Secondary | ICD-10-CM | POA: Diagnosis not present

## 2023-07-23 DIAGNOSIS — E559 Vitamin D deficiency, unspecified: Secondary | ICD-10-CM | POA: Diagnosis not present

## 2023-07-23 DIAGNOSIS — E038 Other specified hypothyroidism: Secondary | ICD-10-CM | POA: Diagnosis not present

## 2023-07-23 DIAGNOSIS — G40309 Generalized idiopathic epilepsy and epileptic syndromes, not intractable, without status epilepticus: Secondary | ICD-10-CM | POA: Diagnosis not present

## 2023-07-23 DIAGNOSIS — E119 Type 2 diabetes mellitus without complications: Secondary | ICD-10-CM | POA: Diagnosis not present

## 2023-07-23 DIAGNOSIS — F411 Generalized anxiety disorder: Secondary | ICD-10-CM | POA: Diagnosis not present

## 2023-07-23 DIAGNOSIS — E538 Deficiency of other specified B group vitamins: Secondary | ICD-10-CM | POA: Diagnosis not present

## 2023-07-23 DIAGNOSIS — Z23 Encounter for immunization: Secondary | ICD-10-CM | POA: Diagnosis not present

## 2023-07-23 DIAGNOSIS — Z6841 Body Mass Index (BMI) 40.0 and over, adult: Secondary | ICD-10-CM | POA: Diagnosis not present

## 2023-07-23 DIAGNOSIS — Z135 Encounter for screening for eye and ear disorders: Secondary | ICD-10-CM | POA: Diagnosis not present

## 2023-07-28 DIAGNOSIS — L304 Erythema intertrigo: Secondary | ICD-10-CM | POA: Diagnosis not present

## 2023-07-28 DIAGNOSIS — F424 Excoriation (skin-picking) disorder: Secondary | ICD-10-CM | POA: Diagnosis not present

## 2023-08-12 DIAGNOSIS — L309 Dermatitis, unspecified: Secondary | ICD-10-CM | POA: Diagnosis not present

## 2023-08-14 DIAGNOSIS — E538 Deficiency of other specified B group vitamins: Secondary | ICD-10-CM | POA: Diagnosis not present

## 2023-08-22 DIAGNOSIS — G40309 Generalized idiopathic epilepsy and epileptic syndromes, not intractable, without status epilepticus: Secondary | ICD-10-CM | POA: Diagnosis not present

## 2023-08-29 DIAGNOSIS — Z79899 Other long term (current) drug therapy: Secondary | ICD-10-CM | POA: Diagnosis not present

## 2023-08-29 DIAGNOSIS — H35033 Hypertensive retinopathy, bilateral: Secondary | ICD-10-CM | POA: Diagnosis not present

## 2023-08-29 DIAGNOSIS — H52223 Regular astigmatism, bilateral: Secondary | ICD-10-CM | POA: Diagnosis not present

## 2023-08-29 DIAGNOSIS — H5213 Myopia, bilateral: Secondary | ICD-10-CM | POA: Diagnosis not present

## 2023-08-29 DIAGNOSIS — E1165 Type 2 diabetes mellitus with hyperglycemia: Secondary | ICD-10-CM | POA: Diagnosis not present

## 2023-09-11 DIAGNOSIS — H906 Mixed conductive and sensorineural hearing loss, bilateral: Secondary | ICD-10-CM | POA: Diagnosis not present

## 2023-09-11 DIAGNOSIS — F411 Generalized anxiety disorder: Secondary | ICD-10-CM | POA: Diagnosis not present

## 2023-09-11 DIAGNOSIS — Z9889 Other specified postprocedural states: Secondary | ICD-10-CM | POA: Diagnosis not present

## 2023-09-11 DIAGNOSIS — F319 Bipolar disorder, unspecified: Secondary | ICD-10-CM | POA: Diagnosis not present

## 2023-09-11 DIAGNOSIS — F39 Unspecified mood [affective] disorder: Secondary | ICD-10-CM | POA: Diagnosis not present

## 2023-09-16 DIAGNOSIS — E538 Deficiency of other specified B group vitamins: Secondary | ICD-10-CM | POA: Diagnosis not present

## 2023-10-17 DIAGNOSIS — E538 Deficiency of other specified B group vitamins: Secondary | ICD-10-CM | POA: Diagnosis not present

## 2023-11-17 DIAGNOSIS — E538 Deficiency of other specified B group vitamins: Secondary | ICD-10-CM | POA: Diagnosis not present

## 2023-11-20 DIAGNOSIS — E038 Other specified hypothyroidism: Secondary | ICD-10-CM | POA: Diagnosis not present

## 2023-11-20 DIAGNOSIS — E119 Type 2 diabetes mellitus without complications: Secondary | ICD-10-CM | POA: Diagnosis not present

## 2023-11-20 DIAGNOSIS — E559 Vitamin D deficiency, unspecified: Secondary | ICD-10-CM | POA: Diagnosis not present

## 2023-11-26 DIAGNOSIS — D509 Iron deficiency anemia, unspecified: Secondary | ICD-10-CM | POA: Diagnosis not present

## 2023-11-26 DIAGNOSIS — E538 Deficiency of other specified B group vitamins: Secondary | ICD-10-CM | POA: Diagnosis not present

## 2023-11-26 DIAGNOSIS — Z6841 Body Mass Index (BMI) 40.0 and over, adult: Secondary | ICD-10-CM | POA: Diagnosis not present

## 2023-11-26 DIAGNOSIS — E785 Hyperlipidemia, unspecified: Secondary | ICD-10-CM | POA: Diagnosis not present

## 2023-11-26 DIAGNOSIS — E559 Vitamin D deficiency, unspecified: Secondary | ICD-10-CM | POA: Diagnosis not present

## 2023-11-26 DIAGNOSIS — R0989 Other specified symptoms and signs involving the circulatory and respiratory systems: Secondary | ICD-10-CM | POA: Diagnosis not present

## 2023-11-26 DIAGNOSIS — E119 Type 2 diabetes mellitus without complications: Secondary | ICD-10-CM | POA: Diagnosis not present

## 2023-11-26 DIAGNOSIS — E039 Hypothyroidism, unspecified: Secondary | ICD-10-CM | POA: Diagnosis not present

## 2023-11-26 DIAGNOSIS — F411 Generalized anxiety disorder: Secondary | ICD-10-CM | POA: Diagnosis not present

## 2023-12-04 DIAGNOSIS — R0989 Other specified symptoms and signs involving the circulatory and respiratory systems: Secondary | ICD-10-CM | POA: Diagnosis not present

## 2023-12-11 DIAGNOSIS — F411 Generalized anxiety disorder: Secondary | ICD-10-CM | POA: Diagnosis not present

## 2023-12-11 DIAGNOSIS — F319 Bipolar disorder, unspecified: Secondary | ICD-10-CM | POA: Diagnosis not present

## 2023-12-11 DIAGNOSIS — R632 Polyphagia: Secondary | ICD-10-CM | POA: Diagnosis not present

## 2023-12-11 DIAGNOSIS — F39 Unspecified mood [affective] disorder: Secondary | ICD-10-CM | POA: Diagnosis not present

## 2023-12-18 DIAGNOSIS — E538 Deficiency of other specified B group vitamins: Secondary | ICD-10-CM | POA: Diagnosis not present

## 2023-12-18 DIAGNOSIS — Z23 Encounter for immunization: Secondary | ICD-10-CM | POA: Diagnosis not present

## 2023-12-31 DIAGNOSIS — E039 Hypothyroidism, unspecified: Secondary | ICD-10-CM | POA: Diagnosis not present

## 2024-01-19 DIAGNOSIS — E538 Deficiency of other specified B group vitamins: Secondary | ICD-10-CM | POA: Diagnosis not present

## 2024-02-12 DIAGNOSIS — L309 Dermatitis, unspecified: Secondary | ICD-10-CM | POA: Diagnosis not present

## 2024-02-12 DIAGNOSIS — F424 Excoriation (skin-picking) disorder: Secondary | ICD-10-CM | POA: Diagnosis not present

## 2024-02-12 DIAGNOSIS — L280 Lichen simplex chronicus: Secondary | ICD-10-CM | POA: Diagnosis not present

## 2024-02-12 DIAGNOSIS — L292 Pruritus vulvae: Secondary | ICD-10-CM | POA: Diagnosis not present

## 2024-02-19 DIAGNOSIS — L304 Erythema intertrigo: Secondary | ICD-10-CM | POA: Diagnosis not present

## 2024-02-19 DIAGNOSIS — L281 Prurigo nodularis: Secondary | ICD-10-CM | POA: Diagnosis not present

## 2024-02-19 DIAGNOSIS — L900 Lichen sclerosus et atrophicus: Secondary | ICD-10-CM | POA: Diagnosis not present

## 2024-02-19 DIAGNOSIS — E538 Deficiency of other specified B group vitamins: Secondary | ICD-10-CM | POA: Diagnosis not present

## 2024-03-16 DIAGNOSIS — E559 Vitamin D deficiency, unspecified: Secondary | ICD-10-CM | POA: Diagnosis not present

## 2024-03-16 DIAGNOSIS — E119 Type 2 diabetes mellitus without complications: Secondary | ICD-10-CM | POA: Diagnosis not present

## 2024-03-16 DIAGNOSIS — E039 Hypothyroidism, unspecified: Secondary | ICD-10-CM | POA: Diagnosis not present

## 2024-03-17 DIAGNOSIS — E785 Hyperlipidemia, unspecified: Secondary | ICD-10-CM | POA: Diagnosis not present

## 2024-03-17 DIAGNOSIS — E559 Vitamin D deficiency, unspecified: Secondary | ICD-10-CM | POA: Diagnosis not present

## 2024-03-17 DIAGNOSIS — E038 Other specified hypothyroidism: Secondary | ICD-10-CM | POA: Diagnosis not present

## 2024-03-17 DIAGNOSIS — Z0000 Encounter for general adult medical examination without abnormal findings: Secondary | ICD-10-CM | POA: Diagnosis not present

## 2024-03-17 DIAGNOSIS — E119 Type 2 diabetes mellitus without complications: Secondary | ICD-10-CM | POA: Diagnosis not present

## 2024-03-22 DIAGNOSIS — E538 Deficiency of other specified B group vitamins: Secondary | ICD-10-CM | POA: Diagnosis not present
